# Patient Record
Sex: Female | Born: 1945
Health system: Southern US, Community
[De-identification: ages and names within clinical notes are randomized; demographics above are authoritative.]

## PROBLEM LIST (undated history)

## (undated) DIAGNOSIS — F329 Major depressive disorder, single episode, unspecified: Secondary | ICD-10-CM

## (undated) DIAGNOSIS — I10 Essential (primary) hypertension: Secondary | ICD-10-CM

## (undated) DIAGNOSIS — E785 Hyperlipidemia, unspecified: Secondary | ICD-10-CM

## (undated) DIAGNOSIS — G510 Bell's palsy: Secondary | ICD-10-CM

## (undated) DIAGNOSIS — F419 Anxiety disorder, unspecified: Secondary | ICD-10-CM

## (undated) DIAGNOSIS — K219 Gastro-esophageal reflux disease without esophagitis: Secondary | ICD-10-CM

## (undated) DIAGNOSIS — C801 Malignant (primary) neoplasm, unspecified: Secondary | ICD-10-CM

## (undated) DIAGNOSIS — R002 Palpitations: Secondary | ICD-10-CM

## (undated) DIAGNOSIS — F32A Depression, unspecified: Secondary | ICD-10-CM

## (undated) DIAGNOSIS — I351 Nonrheumatic aortic (valve) insufficiency: Secondary | ICD-10-CM

## (undated) DIAGNOSIS — Z923 Personal history of irradiation: Secondary | ICD-10-CM

## (undated) DIAGNOSIS — C50919 Malignant neoplasm of unspecified site of unspecified female breast: Secondary | ICD-10-CM

## (undated) HISTORY — DX: Hyperlipidemia, unspecified: E78.5

## (undated) HISTORY — DX: Malignant (primary) neoplasm, unspecified: C80.1

## (undated) HISTORY — DX: Bell's palsy: G51.0

## (undated) HISTORY — PX: OTHER SURGICAL HISTORY: SHX169

## (undated) HISTORY — DX: Palpitations: R00.2

## (undated) HISTORY — PX: TRANSTHORACIC ECHOCARDIOGRAM: SHX275

## (undated) HISTORY — DX: Essential (primary) hypertension: I10

## (undated) HISTORY — DX: Nonrheumatic aortic (valve) insufficiency: I35.1

---
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
## 1976-10-22 HISTORY — PX: TUBAL LIGATION: SHX77

## 1999-09-29 ENCOUNTER — Other Ambulatory Visit: Admission: RE | Admit: 1999-09-29 | Discharge: 1999-09-29 | Payer: Self-pay | Admitting: Urology

## 2000-02-08 ENCOUNTER — Encounter: Admission: RE | Admit: 2000-02-08 | Discharge: 2000-02-08 | Payer: Self-pay | Admitting: Cardiology

## 2000-02-08 ENCOUNTER — Encounter: Payer: Self-pay | Admitting: Cardiology

## 2000-06-28 ENCOUNTER — Other Ambulatory Visit: Admission: RE | Admit: 2000-06-28 | Discharge: 2000-06-28 | Payer: Self-pay | Admitting: *Deleted

## 2001-07-17 ENCOUNTER — Other Ambulatory Visit: Admission: RE | Admit: 2001-07-17 | Discharge: 2001-07-17 | Payer: Self-pay | Admitting: *Deleted

## 2002-08-20 ENCOUNTER — Encounter: Admission: RE | Admit: 2002-08-20 | Discharge: 2002-08-20 | Payer: Self-pay | Admitting: *Deleted

## 2002-08-20 ENCOUNTER — Encounter: Payer: Self-pay | Admitting: *Deleted

## 2004-01-18 ENCOUNTER — Encounter: Admission: RE | Admit: 2004-01-18 | Discharge: 2004-01-18 | Payer: Self-pay | Admitting: Cardiology

## 2004-10-19 ENCOUNTER — Other Ambulatory Visit: Admission: RE | Admit: 2004-10-19 | Discharge: 2004-10-19 | Payer: Self-pay | Admitting: Gynecology

## 2004-12-06 ENCOUNTER — Emergency Department (HOSPITAL_COMMUNITY): Admission: EM | Admit: 2004-12-06 | Discharge: 2004-12-06 | Payer: Self-pay | Admitting: Family Medicine

## 2005-04-03 ENCOUNTER — Emergency Department (HOSPITAL_COMMUNITY): Admission: EM | Admit: 2005-04-03 | Discharge: 2005-04-03 | Payer: Self-pay | Admitting: Family Medicine

## 2005-04-17 ENCOUNTER — Encounter: Admission: RE | Admit: 2005-04-17 | Discharge: 2005-04-17 | Payer: Self-pay | Admitting: Gynecology

## 2005-12-06 ENCOUNTER — Other Ambulatory Visit: Admission: RE | Admit: 2005-12-06 | Discharge: 2005-12-06 | Payer: Self-pay | Admitting: Gynecology

## 2006-07-10 ENCOUNTER — Encounter: Admission: RE | Admit: 2006-07-10 | Discharge: 2006-07-10 | Payer: Self-pay | Admitting: Cardiology

## 2007-05-13 ENCOUNTER — Other Ambulatory Visit: Admission: RE | Admit: 2007-05-13 | Discharge: 2007-05-13 | Payer: Self-pay | Admitting: Gynecology

## 2007-10-23 DIAGNOSIS — G510 Bell's palsy: Secondary | ICD-10-CM

## 2007-10-23 HISTORY — DX: Bell's palsy: G51.0

## 2008-04-29 ENCOUNTER — Emergency Department (HOSPITAL_COMMUNITY): Admission: EM | Admit: 2008-04-29 | Discharge: 2008-04-29 | Payer: Self-pay | Admitting: Family Medicine

## 2008-05-09 ENCOUNTER — Emergency Department (HOSPITAL_COMMUNITY): Admission: EM | Admit: 2008-05-09 | Discharge: 2008-05-09 | Payer: Self-pay | Admitting: Emergency Medicine

## 2009-04-02 ENCOUNTER — Emergency Department (HOSPITAL_COMMUNITY): Admission: EM | Admit: 2009-04-02 | Discharge: 2009-04-02 | Payer: Self-pay | Admitting: Family Medicine

## 2009-04-11 ENCOUNTER — Encounter: Admission: RE | Admit: 2009-04-11 | Discharge: 2009-04-11 | Payer: Self-pay | Admitting: Otolaryngology

## 2009-11-15 ENCOUNTER — Encounter: Admission: RE | Admit: 2009-11-15 | Discharge: 2009-11-15 | Payer: Self-pay | Admitting: Obstetrics and Gynecology

## 2009-11-16 ENCOUNTER — Encounter: Admission: RE | Admit: 2009-11-16 | Discharge: 2009-11-16 | Payer: Self-pay | Admitting: Obstetrics and Gynecology

## 2009-11-22 ENCOUNTER — Encounter: Admission: RE | Admit: 2009-11-22 | Discharge: 2009-11-22 | Payer: Self-pay | Admitting: Obstetrics and Gynecology

## 2009-11-22 DIAGNOSIS — C801 Malignant (primary) neoplasm, unspecified: Secondary | ICD-10-CM

## 2009-11-22 HISTORY — DX: Malignant (primary) neoplasm, unspecified: C80.1

## 2009-11-22 HISTORY — PX: BREAST LUMPECTOMY: SHX2

## 2009-12-13 ENCOUNTER — Encounter: Admission: RE | Admit: 2009-12-13 | Discharge: 2009-12-13 | Payer: Self-pay | Admitting: Surgery

## 2009-12-15 ENCOUNTER — Encounter: Admission: RE | Admit: 2009-12-15 | Discharge: 2009-12-15 | Payer: Self-pay | Admitting: Surgery

## 2009-12-15 ENCOUNTER — Ambulatory Visit (HOSPITAL_BASED_OUTPATIENT_CLINIC_OR_DEPARTMENT_OTHER): Admission: RE | Admit: 2009-12-15 | Discharge: 2009-12-15 | Payer: Self-pay | Admitting: Surgery

## 2010-01-02 ENCOUNTER — Ambulatory Visit: Payer: Self-pay | Admitting: Oncology

## 2010-01-10 ENCOUNTER — Ambulatory Visit: Admission: RE | Admit: 2010-01-10 | Discharge: 2010-02-20 | Payer: Self-pay | Admitting: Radiation Oncology

## 2010-01-10 ENCOUNTER — Encounter: Payer: Self-pay | Admitting: Family Medicine

## 2010-01-16 LAB — COMPREHENSIVE METABOLIC PANEL
Alkaline Phosphatase: 65 U/L (ref 39–117)
CO2: 28 mEq/L (ref 19–32)
Creatinine, Ser: 0.75 mg/dL (ref 0.40–1.20)
Glucose, Bld: 96 mg/dL (ref 70–99)
Sodium: 138 mEq/L (ref 135–145)
Total Bilirubin: 0.6 mg/dL (ref 0.3–1.2)

## 2010-01-16 LAB — CBC WITH DIFFERENTIAL/PLATELET
BASO%: 0.6 % (ref 0.0–2.0)
Eosinophils Absolute: 0.2 10*3/uL (ref 0.0–0.5)
HCT: 39.5 % (ref 34.8–46.6)
LYMPH%: 33.5 % (ref 14.0–49.7)
MCHC: 34.2 g/dL (ref 31.5–36.0)
MCV: 93.6 fL (ref 79.5–101.0)
MONO#: 0.6 10*3/uL (ref 0.1–0.9)
MONO%: 10.3 % (ref 0.0–14.0)
NEUT%: 51.7 % (ref 38.4–76.8)
Platelets: 205 10*3/uL (ref 145–400)
RBC: 4.22 10*6/uL (ref 3.70–5.45)
WBC: 5.7 10*3/uL (ref 3.9–10.3)

## 2010-01-27 LAB — HYPERCOAGULABLE PANEL, COMPREHENSIVE
Anticardiolipin IgA: 2 APL U/mL (ref <22)
Anticardiolipin IgM: 9 MPL U/mL (ref <11)
DRVVT: 43.3 secs (ref 36.2–44.3)
Lupus Anticoagulant: NOT DETECTED
Protein C, Total: 109 % (ref 70–140)
Protein S Activity: 104 % (ref 69–129)

## 2010-03-09 ENCOUNTER — Ambulatory Visit: Payer: Self-pay | Admitting: Oncology

## 2010-03-13 LAB — CBC WITH DIFFERENTIAL/PLATELET
BASO%: 0.7 % (ref 0.0–2.0)
Basophils Absolute: 0 10*3/uL (ref 0.0–0.1)
Eosinophils Absolute: 0.2 10*3/uL (ref 0.0–0.5)
HCT: 37.5 % (ref 34.8–46.6)
HGB: 13.2 g/dL (ref 11.6–15.9)
LYMPH%: 27.2 % (ref 14.0–49.7)
MCHC: 35.1 g/dL (ref 31.5–36.0)
MONO#: 0.5 10*3/uL (ref 0.1–0.9)
NEUT%: 55.7 % (ref 38.4–76.8)
Platelets: 169 10*3/uL (ref 145–400)
WBC: 4 10*3/uL (ref 3.9–10.3)
lymph#: 1.1 10*3/uL (ref 0.9–3.3)

## 2010-05-04 ENCOUNTER — Ambulatory Visit: Payer: Self-pay | Admitting: Oncology

## 2010-05-08 LAB — CBC WITH DIFFERENTIAL/PLATELET
BASO%: 0.6 % (ref 0.0–2.0)
Eosinophils Absolute: 0.1 10*3/uL (ref 0.0–0.5)
HCT: 37.2 % (ref 34.8–46.6)
LYMPH%: 29 % (ref 14.0–49.7)
MCHC: 34.7 g/dL (ref 31.5–36.0)
MCV: 92.8 fL (ref 79.5–101.0)
MONO%: 10.1 % (ref 0.0–14.0)
NEUT%: 56.9 % (ref 38.4–76.8)
Platelets: 184 10*3/uL (ref 145–400)
RBC: 4.01 10*6/uL (ref 3.70–5.45)

## 2010-05-09 LAB — COMPREHENSIVE METABOLIC PANEL
Alkaline Phosphatase: 46 U/L (ref 39–117)
Creatinine, Ser: 0.88 mg/dL (ref 0.40–1.20)
Glucose, Bld: 104 mg/dL — ABNORMAL HIGH (ref 70–99)
Sodium: 138 mEq/L (ref 135–145)
Total Bilirubin: 0.4 mg/dL (ref 0.3–1.2)
Total Protein: 6.7 g/dL (ref 6.0–8.3)

## 2010-05-09 LAB — VITAMIN D 25 HYDROXY (VIT D DEFICIENCY, FRACTURES): Vit D, 25-Hydroxy: 38 ng/mL (ref 30–89)

## 2010-05-23 ENCOUNTER — Encounter: Payer: Self-pay | Admitting: Family Medicine

## 2010-05-30 ENCOUNTER — Ambulatory Visit: Payer: Self-pay | Admitting: Cardiology

## 2010-11-09 ENCOUNTER — Ambulatory Visit: Payer: Self-pay | Admitting: Oncology

## 2010-11-09 ENCOUNTER — Encounter
Admission: RE | Admit: 2010-11-09 | Discharge: 2010-11-09 | Payer: Self-pay | Source: Home / Self Care | Attending: Oncology | Admitting: Oncology

## 2010-11-13 LAB — COMPREHENSIVE METABOLIC PANEL
ALT: 16 U/L (ref 0–35)
Alkaline Phosphatase: 40 U/L (ref 39–117)
Sodium: 138 mEq/L (ref 135–145)
Total Bilirubin: 0.4 mg/dL (ref 0.3–1.2)
Total Protein: 6.5 g/dL (ref 6.0–8.3)

## 2010-11-13 LAB — CBC WITH DIFFERENTIAL/PLATELET
BASO%: 1.4 % (ref 0.0–2.0)
EOS%: 2.9 % (ref 0.0–7.0)
LYMPH%: 38.4 % (ref 14.0–49.7)
MCH: 32 pg (ref 25.1–34.0)
MCHC: 34.5 g/dL (ref 31.5–36.0)
MCV: 92.6 fL (ref 79.5–101.0)
MONO%: 9.9 % (ref 0.0–14.0)
Platelets: 168 10*3/uL (ref 145–400)
RBC: 4.02 10*6/uL (ref 3.70–5.45)
WBC: 4.6 10*3/uL (ref 3.9–10.3)

## 2010-11-21 NOTE — Letter (Signed)
Summary: [facility]   Imported By: Curtis Sites 05/25/2010 13:58:23  _____________________________________________________________________  External Attachment:    Type:   Image     Comment:   External Document

## 2010-11-21 NOTE — [facility] Notes (Signed)
[facility]   Imported By: Lind Guest 01/13/2010 08:48:36  _____________________________________________________________________  External Attachment:    Type:   Image     Comment:   External Document

## 2010-12-08 ENCOUNTER — Ambulatory Visit (INDEPENDENT_AMBULATORY_CARE_PROVIDER_SITE_OTHER): Payer: Medicare Other | Admitting: Cardiology

## 2010-12-08 DIAGNOSIS — N3090 Cystitis, unspecified without hematuria: Secondary | ICD-10-CM

## 2010-12-08 DIAGNOSIS — Z79899 Other long term (current) drug therapy: Secondary | ICD-10-CM

## 2010-12-08 DIAGNOSIS — E7800 Pure hypercholesterolemia, unspecified: Secondary | ICD-10-CM

## 2010-12-08 DIAGNOSIS — R002 Palpitations: Secondary | ICD-10-CM

## 2011-01-10 LAB — COMPREHENSIVE METABOLIC PANEL
BUN: 8 mg/dL (ref 6–23)
CO2: 29 mEq/L (ref 19–32)
Calcium: 9.3 mg/dL (ref 8.4–10.5)
Chloride: 104 mEq/L (ref 96–112)
Creatinine, Ser: 0.77 mg/dL (ref 0.4–1.2)
GFR calc non Af Amer: >60 mL/min (ref >60)
Glucose, Bld: 109 mg/dL — ABNORMAL HIGH (ref 70–99)
Total Bilirubin: 0.9 mg/dL (ref 0.3–1.2)

## 2011-01-10 LAB — CBC
HCT: 39.3 % (ref 36.0–46.0)
MCHC: 34.3 g/dL (ref 30.0–36.0)
MCV: 92.6 fL (ref 78.0–100.0)
RBC: 4.25 MIL/uL (ref 3.87–5.11)
WBC: 6.7 10*3/uL (ref 4.0–10.5)

## 2011-01-10 LAB — DIFFERENTIAL
Basophils Absolute: 0 10*3/uL (ref 0.0–0.1)
Eosinophils Relative: 3 % (ref 0–5)
Lymphocytes Relative: 39 % (ref 12–46)
Neutro Abs: 3.3 10*3/uL (ref 1.7–7.7)
Neutrophils Relative %: 49 % (ref 43–77)

## 2011-01-29 LAB — POCT URINALYSIS DIP (DEVICE)
Bilirubin Urine: NEGATIVE
Glucose, UA: NEGATIVE mg/dL
Specific Gravity, Urine: <=1.005 (ref 1.005–1.030)
Urobilinogen, UA: 0.2 mg/dL (ref 0.0–1.0)

## 2011-02-08 ENCOUNTER — Other Ambulatory Visit: Payer: Self-pay | Admitting: *Deleted

## 2011-02-08 DIAGNOSIS — I10 Essential (primary) hypertension: Secondary | ICD-10-CM

## 2011-02-08 MED ORDER — ATENOLOL 25 MG PO TABS: 25.0000 mg | ORAL_TABLET | Freq: Every day | ORAL | Status: DC

## 2011-02-08 NOTE — Telephone Encounter (Signed)
Faxed refill to [facility] for Atenolol

## 2011-02-28 ENCOUNTER — Telehealth: Payer: Self-pay | Admitting: Cardiology

## 2011-02-28 DIAGNOSIS — I10 Essential (primary) hypertension: Secondary | ICD-10-CM

## 2011-02-28 DIAGNOSIS — R002 Palpitations: Secondary | ICD-10-CM

## 2011-02-28 MED ORDER — ATENOLOL 25 MG PO TABS: ORAL_TABLET | ORAL | Status: DC

## 2011-02-28 NOTE — Telephone Encounter (Signed)
Patient called stating  that verbal ok was given at last office visit for her to take an extra atenolol when her heart was racing.  Looked in chart and this advise given in past.  Sent in new rx for increased dose.

## 2011-02-28 NOTE — Telephone Encounter (Signed)
Has a question about her medication. She said that she will be running out.

## 2011-03-05 NOTE — Telephone Encounter (Signed)
Agree with adjustment in dose as necessary

## 2011-03-07 ENCOUNTER — Encounter (INDEPENDENT_AMBULATORY_CARE_PROVIDER_SITE_OTHER): Payer: Self-pay | Admitting: Surgery

## 2011-04-04 ENCOUNTER — Telehealth: Payer: Self-pay | Admitting: Cardiology

## 2011-04-04 DIAGNOSIS — R0989 Other specified symptoms and signs involving the circulatory and respiratory systems: Secondary | ICD-10-CM

## 2011-04-04 MED ORDER — AZITHROMYCIN 250 MG PO TABS: ORAL_TABLET | ORAL | Status: AC

## 2011-04-04 NOTE — Telephone Encounter (Signed)
Call in a Z-Pak and have her take plain Mucinex

## 2011-04-04 NOTE — Telephone Encounter (Signed)
Allergic to EES per paper chart.  Please advise

## 2011-04-04 NOTE — Telephone Encounter (Signed)
Advised and called in to pharmacy

## 2011-04-04 NOTE — Telephone Encounter (Signed)
Patient wanted RN to know that she is having some head and chest congestion and she wants to know if Dr.Brackbill will call in an antibiotic for her

## 2011-04-20 ENCOUNTER — Telehealth: Payer: Self-pay | Admitting: Cardiology

## 2011-04-20 ENCOUNTER — Other Ambulatory Visit (INDEPENDENT_AMBULATORY_CARE_PROVIDER_SITE_OTHER): Payer: Medicare Other | Admitting: *Deleted

## 2011-04-20 DIAGNOSIS — R309 Painful micturition, unspecified: Secondary | ICD-10-CM

## 2011-04-20 NOTE — Telephone Encounter (Signed)
Spoke with patient.  Has pain when urinates and toilet tissue had a little pink on it this am.  Denies any vaginal discharge.  Coming in for urinalysis

## 2011-04-20 NOTE — Telephone Encounter (Signed)
Addended by: Regis Bill B on: 04/20/2011 09:55 AM   Modules accepted: Orders

## 2011-04-20 NOTE — Telephone Encounter (Signed)
PATIENT COMPLAINING OF BURNING WHEN URINATING.  ALSO STATES LIGHT PINK ON TOLIET PAPER.  PATIENT CANNOT TAKE MICROBID/MICRODENTIN(SPELLING), SHE CAN TAKE CIPRO.

## 2011-04-20 NOTE — Telephone Encounter (Signed)
Left message

## 2011-04-20 NOTE — Telephone Encounter (Signed)
If UA is positive can Rx cipro 250 BID x 7 days

## 2011-04-21 LAB — URINALYSIS W MICROSCOPIC + REFLEX CULTURE
Glucose, UA: NEGATIVE mg/dL
Protein, ur: 30 mg/dL — AB
Specific Gravity, Urine: 1.013 (ref 1.005–1.030)
Squamous Epithelial / LPF: NONE SEEN
Urobilinogen, UA: 0.2 mg/dL (ref 0.0–1.0)
WBC, UA: >50 WBC/hpf — AB (ref <3)

## 2011-04-23 ENCOUNTER — Telehealth: Payer: Self-pay | Admitting: *Deleted

## 2011-04-23 LAB — URINE CULTURE

## 2011-04-23 NOTE — Telephone Encounter (Signed)
Message copied by Burnell Blanks on Mon Apr 23, 2011  4:26 PM ------      Message from: Cassell Clement      Created: Sun Apr 22, 2011 12:55 PM       We called her in Cipro before the weekend.

## 2011-04-23 NOTE — Progress Notes (Signed)
Left message on 6/29 and today, have not heard back.  Needs cipro

## 2011-04-24 NOTE — Telephone Encounter (Signed)
Spoke with patient yesterday, feeling better

## 2011-04-26 ENCOUNTER — Telehealth: Payer: Self-pay | Admitting: *Deleted

## 2011-04-26 NOTE — Telephone Encounter (Signed)
Advised patient on Monday of results and she stated she was feeling better

## 2011-04-26 NOTE — Telephone Encounter (Signed)
Message copied by Burnell Blanks on Thu Apr 26, 2011  2:06 PM ------      Message from: Cassell Clement      Created: Mon Apr 23, 2011 10:10 PM       Please report.  The urinalysis and culture are growing Escherichia coli.  Hopefully her symptoms are responding to the Cipro

## 2011-04-26 NOTE — Progress Notes (Signed)
advised

## 2011-05-02 ENCOUNTER — Other Ambulatory Visit: Payer: Self-pay | Admitting: Oncology

## 2011-05-02 DIAGNOSIS — Z9889 Other specified postprocedural states: Secondary | ICD-10-CM

## 2011-05-02 DIAGNOSIS — R921 Mammographic calcification found on diagnostic imaging of breast: Secondary | ICD-10-CM

## 2011-05-04 ENCOUNTER — Other Ambulatory Visit: Payer: Self-pay | Admitting: Oncology

## 2011-05-04 ENCOUNTER — Encounter (HOSPITAL_BASED_OUTPATIENT_CLINIC_OR_DEPARTMENT_OTHER): Payer: Medicare Other | Admitting: Oncology

## 2011-05-04 DIAGNOSIS — D0590 Unspecified type of carcinoma in situ of unspecified breast: Secondary | ICD-10-CM

## 2011-05-04 DIAGNOSIS — G4700 Insomnia, unspecified: Secondary | ICD-10-CM

## 2011-05-04 LAB — CBC WITH DIFFERENTIAL/PLATELET
BASO%: 0.4 % (ref 0.0–2.0)
EOS%: 4.1 % (ref 0.0–7.0)
MCH: 31.5 pg (ref 25.1–34.0)
MCHC: 33.9 g/dL (ref 31.5–36.0)
RBC: 3.97 10*6/uL (ref 3.70–5.45)
RDW: 12.5 % (ref 11.2–14.5)
lymph#: 1.8 10*3/uL (ref 0.9–3.3)

## 2011-05-04 LAB — COMPREHENSIVE METABOLIC PANEL
ALT: 15 U/L (ref 0–35)
AST: 18 U/L (ref 0–37)
Albumin: 4.2 g/dL (ref 3.5–5.2)
Calcium: 9.3 mg/dL (ref 8.4–10.5)
Chloride: 102 mEq/L (ref 96–112)
Potassium: 4.3 mEq/L (ref 3.5–5.3)

## 2011-05-07 ENCOUNTER — Ambulatory Visit
Admission: RE | Admit: 2011-05-07 | Discharge: 2011-05-07 | Disposition: A | Discharge status: Home / Self Care | Payer: Medicare Other | Source: Ambulatory Visit | Attending: Oncology | Admitting: Oncology

## 2011-05-07 DIAGNOSIS — Z9889 Other specified postprocedural states: Secondary | ICD-10-CM

## 2011-05-07 DIAGNOSIS — R921 Mammographic calcification found on diagnostic imaging of breast: Secondary | ICD-10-CM

## 2011-06-27 ENCOUNTER — Encounter: Payer: Self-pay | Admitting: *Deleted

## 2011-06-27 DIAGNOSIS — R002 Palpitations: Secondary | ICD-10-CM | POA: Insufficient documentation

## 2011-07-11 ENCOUNTER — Encounter: Payer: Self-pay | Admitting: Cardiology

## 2011-07-11 ENCOUNTER — Ambulatory Visit (INDEPENDENT_AMBULATORY_CARE_PROVIDER_SITE_OTHER): Payer: Medicare Other | Admitting: Cardiology

## 2011-07-11 VITALS — BP 128/70 | HR 70 | Wt 157.0 lb

## 2011-07-11 DIAGNOSIS — I119 Hypertensive heart disease without heart failure: Secondary | ICD-10-CM

## 2011-07-11 DIAGNOSIS — R002 Palpitations: Secondary | ICD-10-CM

## 2011-07-11 DIAGNOSIS — E7800 Pure hypercholesterolemia, unspecified: Secondary | ICD-10-CM

## 2011-07-11 HISTORY — DX: Hypertensive heart disease without heart failure: I11.9

## 2011-07-11 HISTORY — DX: Pure hypercholesterolemia, unspecified: E78.00

## 2011-07-11 LAB — HEPATIC FUNCTION PANEL
Albumin: 4 g/dL (ref 3.5–5.2)
Total Bilirubin: 0.5 mg/dL (ref 0.3–1.2)

## 2011-07-11 LAB — LIPID PANEL
HDL: 40.1 mg/dL (ref >39.00)
LDL Cholesterol: 68 mg/dL (ref 0–99)
Total CHOL/HDL Ratio: 3
Triglycerides: 118 mg/dL (ref 0.0–149.0)

## 2011-07-11 LAB — BASIC METABOLIC PANEL
CO2: 29 mEq/L (ref 19–32)
Chloride: 104 mEq/L (ref 96–112)
Sodium: 139 mEq/L (ref 135–145)

## 2011-07-11 NOTE — Progress Notes (Signed)
Lisa Cox Date of Birth:  27-Sep-1946 [facility] [address]   [address] [place], [place]  [address] [phone]           Fax   [phone]  HPI: This pleasant 65 year old woman is seen for a scheduled six-month followup office visit and lab work.  She has a past history of essential hypertension hyperlipidemia and palpitations.  She also has a remote history of Bell's palsy.  In February 2011 she underwent a lumpectomy for cancer of the right breast and this was followed by radiation therapy.  The patient has had a history of atypical chest pain and had a normal stress Cardiolite study on 12/05/09.  Current Outpatient Prescriptions  Medication Sig Dispense Refill  . aspirin 81 MG tablet Take 81 mg by mouth daily. Takes occ.       Marland Kitchen atenolol (TENORMIN) 25 MG tablet As needed       . atorvastatin (LIPITOR) 20 MG tablet Take 20 mg by mouth as directed. Taking one or two times a week       . b complex vitamins tablet Take 1 tablet by mouth daily.        Marland Kitchen LORazepam (ATIVAN) 1 MG tablet Take 1 mg by mouth every 8 (eight) hours. Taking one daily      . Omega-3 Fatty Acids (FISH OIL CONCENTRATE PO) Take by mouth.        . Omeprazole (PRILOSEC PO) Take by mouth. As directed       . tamoxifen (NOLVADEX) 10 MG tablet Take 10 mg by mouth 2 (two) times daily. As directed       . VITAMIN D, CHOLECALCIFEROL, PO Take by mouth.          Allergies  Allergen Reactions  . Ambien   . Crestor (Rosuvastatin Calcium)     Leg cramps  . Erythromycin     nausea  . Niacin And Related     itching  . Restoril     Patient Active Problem List  Diagnoses  . Cancer  . Palpitations    History  Smoking status  . Never Smoker   Smokeless tobacco  . Not on file    History  Alcohol Use No    Family History  Problem Relation Age of Onset  . Cancer Father     pancreatic    Review of Systems: The patient denies any heat or cold intolerance.  No weight gain or  weight loss.  The patient denies headaches or blurry vision.  There is no cough or sputum production.  The patient denies dizziness.  There is no hematuria or hematochezia.  The patient denies any muscle aches or arthritis.  The patient denies any rash.  The patient denies frequent falling or instability.  There is no history of depression or anxiety.  All other systems were reviewed and are negative.   Physical Exam: Filed Vitals:   07/11/11 0842  BP: 128/70  Pulse: 70  The general appearance reveals a well-developed well-nourished woman in no distress.The head and neck exam reveals pupils equal and reactive.  Extraocular movements are full.  There is no scleral icterus.  The mouth and pharynx are normal.  The neck is supple.  The carotids reveal no bruits.  The jugular venous pressure is normal.  The  thyroid is not enlarged.  There is no lymphadenopathy.  The chest is clear to percussion and auscultation.  There are no rales or rhonchi.  Expansion of the chest is symmetrical.  The precordium is quiet.  The first heart sound is normal.  The second heart sound is physiologically split.  There is no murmur gallop rub or click.  There is no abnormal lift or heave.  The abdomen is soft and nontender.  The bowel sounds are normal.  The liver and spleen are not enlarged.  There are no abdominal masses.  There are no abdominal bruits.  Extremities reveal good pedal pulses.  There is no phlebitis or edema.  There is no cyanosis or clubbing.  Strength is normal and symmetrical in all extremities.  There is no lateralizing weakness.  There are no sensory deficits.  The skin is warm and dry.  There is no rash.      Assessment / Plan: The patient is to continue same medication.  She will be using urgent care as her primary care physician for the time being.  We will plan to see her for cardiology followup in 6 months for office visit and fasting lab work

## 2011-07-11 NOTE — Assessment & Plan Note (Signed)
Patient has a history of hypercholesterolemia.  She is on Lipitor 20 mg daily.  She's not having any myalgias from Lipitor.  We are checking blood work today.

## 2011-07-11 NOTE — Assessment & Plan Note (Signed)
The patient has a past history of essential hypertension.  She is on a atenolol.  She's not having any headaches or dizzy spells.

## 2011-07-11 NOTE — Assessment & Plan Note (Signed)
Patient has a history of intermittent palpitations.  She's had no recent episodes.  She does try to get regular exercise by walking.  She has not been expressing any chest pain or shortness of breath.  No dizziness or syncope.

## 2011-07-16 ENCOUNTER — Telehealth: Payer: Self-pay | Admitting: *Deleted

## 2011-07-16 NOTE — Telephone Encounter (Signed)
Advised of labs 

## 2011-07-16 NOTE — Telephone Encounter (Signed)
Message copied by Eugenia Pancoast on Mon Jul 16, 2011  1:38 PM ------      Message from: Cassell Clement      Created: Thu Jul 12, 2011  9:30 PM       Please report.  The blood work is very good.  The cholesterol is nice and low at 132 and the triglycerides are 118.  The kidney and liver tests are normal

## 2011-07-20 LAB — DIFFERENTIAL
Basophils Relative: 1
Eosinophils Absolute: 0.1
Eosinophils Relative: 2
Monocytes Absolute: 0.5
Monocytes Relative: 8

## 2011-07-20 LAB — CBC
HCT: 40.2
Hemoglobin: 13.6
MCHC: 33.9
MCV: 92.2
RBC: 4.36

## 2011-07-20 LAB — POCT I-STAT, CHEM 8
Calcium, Ion: 1.15
Glucose, Bld: 106 — ABNORMAL HIGH
HCT: 41
Hemoglobin: 13.9
Potassium: 3.9

## 2011-07-20 LAB — PROTIME-INR
INR: 0.9
Prothrombin Time: 12.7

## 2011-07-20 LAB — POCT CARDIAC MARKERS
CKMB, poc: <1 — ABNORMAL LOW
Myoglobin, poc: 41.4
Troponin i, poc: <0.05

## 2011-07-31 ENCOUNTER — Other Ambulatory Visit: Payer: Self-pay | Admitting: Cardiology

## 2011-07-31 DIAGNOSIS — F419 Anxiety disorder, unspecified: Secondary | ICD-10-CM

## 2011-07-31 NOTE — Telephone Encounter (Signed)
Pt needs refill on Lorazepan 1 mg.  The pharmacy can no longer fax for the controlled medication.

## 2011-08-01 MED ORDER — LORAZEPAM 1 MG PO TABS: ORAL_TABLET | ORAL | Status: DC

## 2011-08-01 NOTE — Telephone Encounter (Signed)
Pt has called regarding the medication being called in for her.

## 2011-08-01 NOTE — Telephone Encounter (Signed)
[facility]

## 2011-08-02 ENCOUNTER — Telehealth: Payer: Self-pay | Admitting: *Deleted

## 2011-08-02 NOTE — Telephone Encounter (Signed)
Opened in error

## 2011-09-28 ENCOUNTER — Other Ambulatory Visit: Payer: Self-pay | Admitting: Gastroenterology

## 2011-09-28 ENCOUNTER — Encounter: Payer: Self-pay | Admitting: Cardiology

## 2011-09-28 ENCOUNTER — Other Ambulatory Visit: Payer: Self-pay

## 2011-09-28 ENCOUNTER — Observation Stay (HOSPITAL_COMMUNITY)
Admission: EM | Admit: 2011-09-28 | Discharge: 2011-09-30 | DRG: 641 | Disposition: A | Discharge status: Home / Self Care | Payer: Medicare Other | Attending: Internal Medicine | Admitting: Internal Medicine

## 2011-09-28 ENCOUNTER — Emergency Department (HOSPITAL_COMMUNITY): Payer: Medicare Other

## 2011-09-28 ENCOUNTER — Encounter (HOSPITAL_COMMUNITY): Payer: Self-pay | Admitting: *Deleted

## 2011-09-28 DIAGNOSIS — Z79899 Other long term (current) drug therapy: Secondary | ICD-10-CM

## 2011-09-28 DIAGNOSIS — J40 Bronchitis, not specified as acute or chronic: Secondary | ICD-10-CM | POA: Diagnosis present

## 2011-09-28 DIAGNOSIS — I119 Hypertensive heart disease without heart failure: Secondary | ICD-10-CM | POA: Insufficient documentation

## 2011-09-28 DIAGNOSIS — C50919 Malignant neoplasm of unspecified site of unspecified female breast: Secondary | ICD-10-CM | POA: Diagnosis present

## 2011-09-28 DIAGNOSIS — D696 Thrombocytopenia, unspecified: Secondary | ICD-10-CM | POA: Diagnosis present

## 2011-09-28 DIAGNOSIS — J209 Acute bronchitis, unspecified: Secondary | ICD-10-CM | POA: Diagnosis present

## 2011-09-28 DIAGNOSIS — F419 Anxiety disorder, unspecified: Secondary | ICD-10-CM

## 2011-09-28 DIAGNOSIS — Z853 Personal history of malignant neoplasm of breast: Secondary | ICD-10-CM

## 2011-09-28 DIAGNOSIS — Z7982 Long term (current) use of aspirin: Secondary | ICD-10-CM

## 2011-09-28 DIAGNOSIS — E860 Dehydration: Principal | ICD-10-CM | POA: Diagnosis present

## 2011-09-28 DIAGNOSIS — R55 Syncope and collapse: Secondary | ICD-10-CM | POA: Diagnosis present

## 2011-09-28 DIAGNOSIS — I959 Hypotension, unspecified: Secondary | ICD-10-CM | POA: Diagnosis present

## 2011-09-28 DIAGNOSIS — R509 Fever, unspecified: Secondary | ICD-10-CM | POA: Diagnosis present

## 2011-09-28 HISTORY — DX: Anxiety disorder, unspecified: F41.9

## 2011-09-28 HISTORY — DX: Gastro-esophageal reflux disease without esophagitis: K21.9

## 2011-09-28 HISTORY — DX: Major depressive disorder, single episode, unspecified: F32.9

## 2011-09-28 HISTORY — DX: Depression, unspecified: F32.A

## 2011-09-28 LAB — COMPREHENSIVE METABOLIC PANEL
ALT: 16 U/L (ref 0–35)
AST: 23 U/L (ref 0–37)
Albumin: 3.8 g/dL (ref 3.5–5.2)
Alkaline Phosphatase: 39 U/L (ref 39–117)
Chloride: 103 mEq/L (ref 96–112)
Creatinine, Ser: 0.74 mg/dL (ref 0.50–1.10)
Potassium: 3.8 mEq/L (ref 3.5–5.1)
Sodium: 136 mEq/L (ref 135–145)
Total Bilirubin: 0.3 mg/dL (ref 0.3–1.2)

## 2011-09-28 LAB — URINE MICROSCOPIC-ADD ON

## 2011-09-28 LAB — DIFFERENTIAL
Basophils Absolute: 0 10*3/uL (ref 0.0–0.1)
Basophils Relative: 0 % (ref 0–1)
Neutro Abs: 7.9 10*3/uL — ABNORMAL HIGH (ref 1.7–7.7)
Neutrophils Relative %: 85 % — ABNORMAL HIGH (ref 43–77)

## 2011-09-28 LAB — URINALYSIS, ROUTINE W REFLEX MICROSCOPIC
Bilirubin Urine: NEGATIVE
Specific Gravity, Urine: 1.007 (ref 1.005–1.030)
Urobilinogen, UA: 0.2 mg/dL (ref 0.0–1.0)

## 2011-09-28 LAB — CBC
MCHC: 34.6 g/dL (ref 30.0–36.0)
RDW: 12.8 % (ref 11.5–15.5)
WBC: 9.3 10*3/uL (ref 4.0–10.5)

## 2011-09-28 LAB — LACTIC ACID, PLASMA: Lactic Acid, Venous: 2.4 mmol/L — ABNORMAL HIGH (ref 0.5–2.2)

## 2011-09-28 MED ORDER — SODIUM CHLORIDE 0.9 % IV SOLN: INTRAVENOUS | Status: DC

## 2011-09-28 MED ORDER — SODIUM CHLORIDE 0.9 % IV BOLUS (SEPSIS)
500.0000 mL | Freq: Once | INTRAVENOUS | Status: AC
  Administered 2011-09-28: 1000 mL via INTRAVENOUS

## 2011-09-28 NOTE — ED Notes (Signed)
[facility] to [facility], pt reports that she was walking and had a syncopal episode. States that she had a colonoscopy this morning and developed a cough this afternoon. Pt A&Ox4 on arrival. bp- 68/40 Hr- 77.  Cbg-101

## 2011-09-28 NOTE — ED Provider Notes (Signed)
History     CSN: [id number] Arrival date & time: 09/28/2011  6:28 PM   Chief Complaint  Patient presents with  . Loss of Consciousness    HPI Pt was seen at 1930.  Per pt and family, c/o sudden onset and resolution of two brief episodes of syncope that occurred PTA.  Pt states one episode occurred while she was sitting at [facility] for a cough, generalized weakness, and persistent diarrhea since completing her GI prep yesterday for her colonoscopy this morning.  Pt states 2nd episode occurred when she went to stand up to go to the bathroom.  EMS noted SBP "60's" on their arrival to scene.  Pt states she "just felt really weak" and "lightheaded" before both syncopal episodes.  No confusion/AMS, no seizure activity, no incont of bowel or bladder, no CP/SOB, no abd pain, no back pain, no N/V, no black or blood in stools.     Past Medical History  Diagnosis Date  . Hypertension   . Bell's palsy 2009  . Hyperlipidemia   . Cancer Feb.2011    breast,lumpectomy right side followed by radiation  . Palpitations     hx of    Past Surgical History  Procedure Date  . Tubal ligation 1978  . Breast lumpectomy Feb. 2011    right breast,followed by radiation therapy    Family History  Problem Relation Age of Onset  . Cancer Father     pancreatic  . Heart disease Father   . Hypertension Sister   . Hypertension Brother     History  Substance Use Topics  . Smoking status: Never Smoker   . Smokeless tobacco: Not on file  . Alcohol Use: No   Review of Systems ROS: Statement: All systems negative except as marked or noted in the HPI; Constitutional: Negative for fever and chills. ; ; Eyes: Negative for eye pain, redness and discharge. ; ; ENMT: Negative for ear pain, hoarseness, nasal congestion, sinus pressure and sore throat. ; ; Cardiovascular: Negative for chest pain, palpitations, diaphoresis, dyspnea and peripheral edema. ; ; Respiratory: +cough. Negative for  wheezing and stridor. ; ; Gastrointestinal: +diarrhea.  Negative for nausea, vomiting, abdominal pain, blood in stool, hematemesis, jaundice and rectal bleeding. . ; ; Genitourinary: Negative for dysuria, flank pain and hematuria. ; ; Musculoskeletal: Negative for back pain and neck pain. Negative for swelling and trauma.; ; Skin: Negative for pruritus, rash, abrasions, blisters, bruising and skin lesion.; ; Neuro: Negative for headache and neck stiffness. Negative for altered mental status, extremity weakness, paresthesias, involuntary movement, seizure and +generalized weakness, lightheadedness, syncope.     Allergies  Ambien; Crestor; Erythromycin; Niacin and related; and Restoril  Home Medications   Current Outpatient Rx  Name Route Sig Dispense Refill  . ASPIRIN 81 MG PO TABS Oral Take 81 mg by mouth once. Does not take on a daily basis    . ATENOLOL 25 MG PO TABS Oral Take 25-50 mg by mouth daily.     . B COMPLEX PO TABS Oral Take 1 tablet by mouth daily.     Marland Kitchen LORAZEPAM 1 MG PO TABS Oral Take 0.25-0.5 mg by mouth 3 (three) times daily as needed. For anxiety and sleep    . FISH OIL CONCENTRATE PO Oral Take 1 capsule by mouth daily.     Marland Kitchen OMEPRAZOLE 40 MG PO CPDR Oral Take 40 mg by mouth every morning.      Marland Kitchen TAMOXIFEN CITRATE 20 MG  PO TABS Oral Take 20 mg by mouth every morning.        BP 149/66  Pulse 106  Temp(Src) 99 F (37.2 C) (Oral)  Resp 17  SpO2 99%  Physical Exam 1935: Physical examination:  Nursing notes reviewed; Vital signs and O2 SAT reviewed;  Constitutional: Well developed, Well nourished, In no acute distress; Head:  Normocephalic, atraumatic; Eyes: EOMI, PERRL, No scleral icterus; ENMT: Mouth and pharynx normal, Mucous membranes dry; Neck: Supple, Full range of motion, No lymphadenopathy; Cardiovascular: Regular rate and rhythm, No murmur, rub, or gallop; Respiratory: Breath sounds clear & equal bilaterally, No rales, rhonchi, wheezes, or rub, Normal respiratory  effort/excursion; Chest: Nontender, Movement normal; Abdomen: Soft, Nontender, Nondistended, Normal bowel sounds; Extremities: Pulses normal, No tenderness, No edema, No calf edema or asymmetry.; Neuro: AA&Ox3, Major CN grossly intact. No facial droop, speech clear.  Moves all ext well without apparent gross focal motor deficits.; Skin: Color normal, Warm, Dry, no rash.    ED Course  Procedures   MDM  MDM Reviewed: nursing note and vitals Reviewed previous: ECG Interpretation: ECG, labs, x-ray and CT scan    Date: 09/28/2011  Rate: 108  Rhythm: sinus tachycardia  QRS Axis: normal  Intervals: normal  ST/T Wave abnormalities: nonspecific T wave changes  Conduction Disutrbances:none  Narrative Interpretation:   Old EKG Reviewed: unchanged; NS T-wave changes improved from previous EKG dated 05/09/2008.  Results for orders placed during the hospital encounter of 09/28/11  CBC      Component Value Range   WBC 9.3  4.0 - 10.5 (K/uL)   RBC 4.25  3.87 - 5.11 (MIL/uL)   Hemoglobin 13.4  12.0 - 15.0 (g/dL)   HCT 16.1  09.6 - 04.5 (%)   MCV 91.1  78.0 - 100.0 (fL)   MCH 31.5  26.0 - 34.0 (pg)   MCHC 34.6  30.0 - 36.0 (g/dL)   RDW 40.9  81.1 - 91.4 (%)   Platelets 145 (*) 150 - 400 (K/uL)  DIFFERENTIAL      Component Value Range   Neutrophils Relative 85 (*) 43 - 77 (%)   Neutro Abs 7.9 (*) 1.7 - 7.7 (K/uL)   Lymphocytes Relative 8 (*) 12 - 46 (%)   Lymphs Abs 0.7  0.7 - 4.0 (K/uL)   Monocytes Relative 7  3 - 12 (%)   Monocytes Absolute 0.7  0.1 - 1.0 (K/uL)   Eosinophils Relative 1  0 - 5 (%)   Eosinophils Absolute 0.1  0.0 - 0.7 (K/uL)   Basophils Relative 0  0 - 1 (%)   Basophils Absolute 0.0  0.0 - 0.1 (K/uL)  COMPREHENSIVE METABOLIC PANEL      Component Value Range   Sodium 136  135 - 145 (mEq/L)   Potassium 3.8  3.5 - 5.1 (mEq/L)   Chloride 103  96 - 112 (mEq/L)   CO2 24  19 - 32 (mEq/L)   Glucose, Bld 102 (*) 70 - 99 (mg/dL)   BUN 6  6 - 23 (mg/dL)   Creatinine, Ser  7.82  0.50 - 1.10 (mg/dL)   Calcium 8.3 (*) 8.4 - 10.5 (mg/dL)   Total Protein 6.7  6.0 - 8.3 (g/dL)   Albumin 3.8  3.5 - 5.2 (g/dL)   AST 23  0 - 37 (U/L)   ALT 16  0 - 35 (U/L)   Alkaline Phosphatase 39  39 - 117 (U/L)   Total Bilirubin 0.3  0.3 - 1.2 (mg/dL)   GFR  calc non Af Amer 86 (*) >90 (mL/min)   GFR calc Af Amer >90  >90 (mL/min)  LIPASE, BLOOD      Component Value Range   Lipase 40  11 - 59 (U/L)  LACTIC ACID, PLASMA      Component Value Range   Lactic Acid, Venous 2.4 (*) 0.5 - 2.2 (mmol/L)  POCT I-STAT TROPONIN I      Component Value Range   Troponin i, poc 0.00  0.00 - 0.08 (ng/mL)   Comment 3           URINALYSIS, ROUTINE W REFLEX MICROSCOPIC      Component Value Range   Color, Urine YELLOW  YELLOW    APPearance CLEAR  CLEAR    Specific Gravity, Urine 1.007  1.005 - 1.030    pH 6.0  5.0 - 8.0    Glucose, UA NEGATIVE  NEGATIVE (mg/dL)   Hgb urine dipstick SMALL (*) NEGATIVE    Bilirubin Urine NEGATIVE  NEGATIVE    Ketones, ur NEGATIVE  NEGATIVE (mg/dL)   Protein, ur NEGATIVE  NEGATIVE (mg/dL)   Urobilinogen, UA 0.2  0.0 - 1.0 (mg/dL)   Nitrite NEGATIVE  NEGATIVE    Leukocytes, UA NEGATIVE  NEGATIVE   URINE MICROSCOPIC-ADD ON      Component Value Range   Squamous Epithelial / LPF RARE  RARE    WBC, UA 0-2  <3 (WBC/hpf)   RBC / HPF 0-2  <3 (RBC/hpf)  CARDIAC PANEL(CRET KIN+CKTOT+MB+TROPI)      Component Value Range   Total CK 70  7 - 177 (U/L)   CK, MB 2.2  0.3 - 4.0 (ng/mL)   Troponin I <0.30  <0.30 (ng/mL)   Relative Index RELATIVE INDEX IS INVALID  0.0 - 2.5    Ct Head Wo Contrast  09/28/2011  *RADIOLOGY REPORT*  Clinical Data: Syncope  CT HEAD WITHOUT CONTRAST  Technique:  Contiguous axial images were obtained from the base of the skull through the vertex without contrast.  Comparison: [facility] MRI brain dated 04/11/2009  Findings: No evidence of parenchymal hemorrhage or extra-axial fluid collection. No mass lesion, mass effect, or midline  shift.  No CT evidence of acute infarction.  Mild intracranial atherosclerosis.  Cerebral volume is age appropriate.  No ventriculomegaly.  Mucosal thickening/mucous retention cyst in the left maxillary sinus.  The visualized paranasal sinuses and mastoid air cells are otherwise clear.  No evidence of calvarial fracture.  IMPRESSION: No evidence of acute intracranial abnormality.  Original Report Authenticated By: Charline Bills, M.D.   Dg Chest Portable 1 View  09/28/2011  *RADIOLOGY REPORT*  Clinical Data: Chest pain.  Hypotension.  Irregular heart rate.  PORTABLE CHEST - 1 VIEW  Comparison: 11  Findings: Shallow inspiration.  Heart size and pulmonary vascularity are normal for technique.  No focal airspace consolidation in the lungs.  No blunting of costophrenic angles. No pneumothorax.  No significant change since the previous study.  IMPRESSION: No evidence of active pulmonary disease.  Original Report Authenticated By: Marlon Pel, M.D.     2250:  No N/V/D while in ED.  No fevers while in ED.  Not orthostatic.  Pt c/o ED Tech and RN that she now feels "weak" and "like I'm going to pass out."  SBP 70's, HR 50's, sinus brady on monitor.  Sats remain 96% R/A.  Mentating well, resps easy, talking with family at bedside.  Neuro exam unchanged.  Denies CP/SOB, no abd pain, no back pain.  Will give IVF bolus and re-eval.   2300:  T/C to [facility] Dr. Adela Glimpse, case discussed, including:  HPI, pertinent PM/SHx, VS/PE, dx testing, ED course and treatment.  Agreeable to admit.  She will be down to ED for eval.  Requests to obtain flu testing and lab set of cardiac markers.  2330:  Pt now admits to her family and ED staff that she took her own ativan approx 1 hour ago (approx 30 min prior to her symptoms of feeling weak with SBP drop) because she "was a little nervous being here."  IVF bolus given with quick return of SBP to 130's.  Pt cautioned not to take her home meds while in the ED without telling ED  staff.  Verb understanding.  Dr. Adela Glimpse made aware of new information.       Richardo Popoff Allison Quarry, DO 09/30/11 [id number]

## 2011-09-28 NOTE — ED Notes (Signed)
Pts son at bedside, pt has R sided breast cancer & went for colonoscopy today for GI bleeding, pt started to feel worse after taking bowel prep yesterday [facility] like symptoms & passed out, EMS was called & her BP was 60/40 in route to hospital, pt on cardiac monitor, current BP 131/52

## 2011-09-29 ENCOUNTER — Encounter (HOSPITAL_COMMUNITY): Payer: Self-pay | Admitting: Internal Medicine

## 2011-09-29 DIAGNOSIS — Z853 Personal history of malignant neoplasm of breast: Secondary | ICD-10-CM

## 2011-09-29 DIAGNOSIS — D696 Thrombocytopenia, unspecified: Secondary | ICD-10-CM | POA: Diagnosis present

## 2011-09-29 DIAGNOSIS — E860 Dehydration: Secondary | ICD-10-CM | POA: Diagnosis present

## 2011-09-29 DIAGNOSIS — R55 Syncope and collapse: Secondary | ICD-10-CM

## 2011-09-29 DIAGNOSIS — J40 Bronchitis, not specified as acute or chronic: Secondary | ICD-10-CM | POA: Diagnosis present

## 2011-09-29 DIAGNOSIS — I959 Hypotension, unspecified: Secondary | ICD-10-CM | POA: Diagnosis present

## 2011-09-29 LAB — INFLUENZA PANEL BY PCR (TYPE A & B)
H1N1 flu by pcr: NOT DETECTED
Influenza A By PCR: NEGATIVE
Influenza B By PCR: NEGATIVE

## 2011-09-29 LAB — CBC
HCT: 35.6 % — ABNORMAL LOW (ref 36.0–46.0)
HCT: 35.2 % — ABNORMAL LOW (ref 36.0–46.0)
Hemoglobin: 12.2 g/dL (ref 12.0–15.0)
MCH: 30.8 pg (ref 26.0–34.0)
MCH: 30.6 pg (ref 26.0–34.0)
MCHC: 34.3 g/dL (ref 30.0–36.0)
MCHC: 34.1 g/dL (ref 30.0–36.0)
RDW: 12.8 % (ref 11.5–15.5)

## 2011-09-29 LAB — COMPREHENSIVE METABOLIC PANEL
ALT: 14 U/L (ref 0–35)
Alkaline Phosphatase: 37 U/L — ABNORMAL LOW (ref 39–117)
CO2: 24 mEq/L (ref 19–32)
Chloride: 101 mEq/L (ref 96–112)
GFR calc Af Amer: >90 mL/min (ref >90)
GFR calc non Af Amer: 85 mL/min — ABNORMAL LOW (ref >90)
Glucose, Bld: 121 mg/dL — ABNORMAL HIGH (ref 70–99)
Potassium: 3.1 mEq/L — ABNORMAL LOW (ref 3.5–5.1)
Sodium: 135 mEq/L (ref 135–145)

## 2011-09-29 LAB — CARDIAC PANEL(CRET KIN+CKTOT+MB+TROPI)
CK, MB: 2 ng/mL (ref 0.3–4.0)
CK, MB: 2.2 ng/mL (ref 0.3–4.0)
Relative Index: INVALID (ref 0.0–2.5)
Total CK: 73 U/L (ref 7–177)
Total CK: 77 U/L (ref 7–177)
Troponin I: <0.3 ng/mL (ref <0.30)
Troponin I: <0.3 ng/mL (ref <0.30)

## 2011-09-29 LAB — CREATININE, SERUM: Creatinine, Ser: 0.78 mg/dL (ref 0.50–1.10)

## 2011-09-29 MED ORDER — HYDROCODONE-ACETAMINOPHEN 5-325 MG PO TABS
1.0000 | ORAL_TABLET | ORAL | Status: DC | PRN
  Filled 2011-09-29: qty 1

## 2011-09-29 MED ORDER — GUAIFENESIN-DM 100-10 MG/5ML PO SYRP: 5.0000 mL | ORAL_SOLUTION | ORAL | Status: DC | PRN

## 2011-09-29 MED ORDER — ACETAMINOPHEN 325 MG PO TABS
650.0000 mg | ORAL_TABLET | Freq: Four times a day (QID) | ORAL | Status: DC | PRN
  Administered 2011-09-29 (×4): 650 mg via ORAL
  Filled 2011-09-29 (×5): qty 2

## 2011-09-29 MED ORDER — METOPROLOL TARTRATE 12.5 MG HALF TABLET
12.5000 mg | ORAL_TABLET | Freq: Four times a day (QID) | ORAL | Status: DC
  Administered 2011-09-29 (×2): 12.5 mg via ORAL
  Filled 2011-09-29 (×5): qty 1

## 2011-09-29 MED ORDER — GUAIFENESIN ER 600 MG PO TB12
600.0000 mg | ORAL_TABLET | Freq: Two times a day (BID) | ORAL | Status: DC
Start: 2011-09-29 — End: 2011-09-30
  Administered 2011-09-29 – 2011-09-30 (×3): 600 mg via ORAL
  Filled 2011-09-29 (×4): qty 1

## 2011-09-29 MED ORDER — ONDANSETRON HCL 4 MG PO TABS: 4.0000 mg | ORAL_TABLET | Freq: Four times a day (QID) | ORAL | Status: DC | PRN

## 2011-09-29 MED ORDER — ONDANSETRON HCL 4 MG/2ML IJ SOLN
4.0000 mg | Freq: Four times a day (QID) | INTRAMUSCULAR | Status: DC | PRN
  Administered 2011-09-29: 4 mg via INTRAVENOUS

## 2011-09-29 MED ORDER — SODIUM CHLORIDE 0.9 % IV SOLN
INTRAVENOUS | Status: DC
  Administered 2011-09-29: 03:00:00 via INTRAVENOUS

## 2011-09-29 MED ORDER — TAMOXIFEN CITRATE 10 MG PO TABS
20.0000 mg | ORAL_TABLET | Freq: Every day | ORAL | Status: DC
  Administered 2011-09-29 – 2011-09-30 (×2): 20 mg via ORAL
  Filled 2011-09-29 (×2): qty 2

## 2011-09-29 MED ORDER — ATENOLOL 25 MG PO TABS
25.0000 mg | ORAL_TABLET | Freq: Every day | ORAL | Status: DC
  Administered 2011-09-30: 25 mg via ORAL
  Filled 2011-09-29: qty 1

## 2011-09-29 MED ORDER — ACETAMINOPHEN 325 MG PO TABS
ORAL_TABLET | ORAL | Status: AC
  Filled 2011-09-29: qty 2

## 2011-09-29 MED ORDER — OSELTAMIVIR PHOSPHATE 75 MG PO CAPS
75.0000 mg | ORAL_CAPSULE | Freq: Two times a day (BID) | ORAL | Status: DC
  Administered 2011-09-29: 75 mg via ORAL
  Filled 2011-09-29 (×2): qty 1

## 2011-09-29 MED ORDER — ALUM & MAG HYDROXIDE-SIMETH 200-200-20 MG/5ML PO SUSP: 30.0000 mL | Freq: Four times a day (QID) | ORAL | Status: DC | PRN

## 2011-09-29 MED ORDER — MAGNESIUM SULFATE BOLUS VIA INFUSION: 2.0000 g | Freq: Once | INTRAVENOUS | Status: DC

## 2011-09-29 MED ORDER — PANTOPRAZOLE SODIUM 40 MG PO TBEC
40.0000 mg | DELAYED_RELEASE_TABLET | Freq: Every day | ORAL | Status: DC
  Administered 2011-09-29 – 2011-09-30 (×2): 40 mg via ORAL
  Filled 2011-09-29: qty 1

## 2011-09-29 MED ORDER — HYDROCODONE-ACETAMINOPHEN 5-325 MG PO TABS
ORAL_TABLET | ORAL | Status: AC
  Filled 2011-09-29: qty 2

## 2011-09-29 MED ORDER — SODIUM CHLORIDE 0.9 % IV BOLUS (SEPSIS)
500.0000 mL | Freq: Once | INTRAVENOUS | Status: AC
  Administered 2011-09-29: 500 mL via INTRAVENOUS

## 2011-09-29 MED ORDER — POTASSIUM CHLORIDE CRYS ER 20 MEQ PO TBCR
40.0000 meq | EXTENDED_RELEASE_TABLET | Freq: Once | ORAL | Status: AC
  Administered 2011-09-29: 40 meq via ORAL
  Filled 2011-09-29: qty 2

## 2011-09-29 MED ORDER — LORAZEPAM 0.5 MG PO TABS: 0.5000 mg | ORAL_TABLET | Freq: Three times a day (TID) | ORAL | Status: DC | PRN

## 2011-09-29 MED ORDER — ENOXAPARIN SODIUM 40 MG/0.4ML ~~LOC~~ SOLN
40.0000 mg | SUBCUTANEOUS | Status: DC
  Administered 2011-09-29 – 2011-09-30 (×2): 40 mg via SUBCUTANEOUS
  Filled 2011-09-29 (×2): qty 0.4

## 2011-09-29 MED ORDER — ATENOLOL 25 MG PO TABS: 25.0000 mg | ORAL_TABLET | Freq: Every day | ORAL | Status: DC

## 2011-09-29 MED ORDER — ALBUTEROL SULFATE (5 MG/ML) 0.5% IN NEBU: 2.5000 mg | INHALATION_SOLUTION | RESPIRATORY_TRACT | Status: DC | PRN

## 2011-09-29 MED ORDER — ACETAMINOPHEN 650 MG RE SUPP: 650.0000 mg | Freq: Four times a day (QID) | RECTAL | Status: DC | PRN

## 2011-09-29 MED ORDER — ASPIRIN 81 MG PO CHEW
81.0000 mg | CHEWABLE_TABLET | Freq: Once | ORAL | Status: AC
  Administered 2011-09-29: 81 mg via ORAL
  Filled 2011-09-29 (×2): qty 1

## 2011-09-29 MED ORDER — LORAZEPAM 1 MG PO TABS
1.0000 mg | ORAL_TABLET | Freq: Four times a day (QID) | ORAL | Status: DC | PRN
  Administered 2011-09-29 (×3): 1 mg via ORAL
  Filled 2011-09-29 (×5): qty 1

## 2011-09-29 MED ORDER — MAGNESIUM SULFATE 40 MG/ML IJ SOLN
2.0000 g | Freq: Once | INTRAMUSCULAR | Status: AC
  Administered 2011-09-29: 2 g via INTRAVENOUS
  Filled 2011-09-29: qty 50

## 2011-09-29 NOTE — Progress Notes (Signed)
*  PRELIMINARY RESULTS* Carotid Dopplers completed.  There is no ICA stenosis.  Vertebral artery flow is antegrade.    Sherren Kerns Renee 09/29/2011, 5:48 PM

## 2011-09-29 NOTE — Progress Notes (Signed)
Subjective: Cough w/ yellow sputum. Diarrhea resolved. No longer dizzy when standing. Feeling much better.   Objective: Blood pressure 136/60, pulse 91, temperature 98.4 F (36.9 C), temperature source Oral, resp. rate 13, height 5\' 5"  (1.651 m), weight 71.6 kg (157 lb 13.6 oz), SpO2 98.00%. Weight change:   Intake/Output Summary (Last 24 hours) at 09/29/11 1409 Last data filed at 09/29/11 1200  Gross per 24 hour  Intake   1000 ml  Output    850 ml  Net    150 ml    Physical Exam: General appearance: alert, cooperative and no distress Eyes: conjunctivae/corneas clear. PERRL, EOM's intact.  Throat: lips, mucosa, and tongue normal; teeth and gums normal Lungs: clear to auscultation bilaterally Heart: regular rate and rhythm, S1, S2 normal, no murmur Abdomen: soft, non-tender; bowel sounds normal; no masses,  no organomegaly Extremities: extremities normal, atraumatic, no cyanosis or edema Skin: Skin color, texture, turgor normal. No rashes or lesions Neurologic: Grossly normal  Lab Results:  Basename 09/29/11 0500 09/29/11 0212 09/28/11 1950  NA 135 -- 136  K 3.1* -- 3.8  CL 101 -- 103  CO2 24 -- 24  GLUCOSE 121* -- 102*  BUN 6 -- 6  CREATININE 0.77 0.78 --  CALCIUM 7.9* -- 8.3*  MG 1.5 -- --  PHOS 2.8 -- --    [facility] 09/29/11 0500 09/28/11 1950  AST 19 23  ALT 14 16  ALKPHOS 37* 39  BILITOT 0.2* 0.3  PROT 6.3 6.7  ALBUMIN 3.3* 3.8    Basename 09/28/11 1950  LIPASE 40  AMYLASE --    Basename 09/29/11 0500 09/29/11 0212 09/28/11 1950  WBC 6.9 7.4 --  NEUTROABS -- -- 7.9*  HGB 12.0 12.2 --  HCT 35.2* 35.6* --  MCV 89.8 89.9 --  PLT 149* 143* --    Basename 09/29/11 1115 09/29/11 0500 09/28/11 2339  CKTOTAL 77 73 70  CKMB 2.0 2.0 2.2  CKMBINDEX -- -- --  TROPONINI <0.30 <0.30 <0.30   No results found for this basename: POCBNP:3 in the last 72 hours No results found for this basename: DDIMER:2 in the last 72 hours No results found for this  basename: HGBA1C:2 in the last 72 hours No results found for this basename: CHOL:2,HDL:2,LDLCALC:2,TRIG:2,CHOLHDL:2,LDLDIRECT:2 in the last 72 hours  Basename 09/29/11 0500  TSH 1.734  T4TOTAL --  T3FREE --  THYROIDAB --   No results found for this basename: VITAMINB12:2,FOLATE:2,FERRITIN:2,TIBC:2,IRON:2,RETICCTPCT:2 in the last 72 hours  Micro Results: Recent Results (from the past 240 hour(s))  MRSA PCR SCREENING     Status: Normal   Collection Time   09/29/11  3:58 AM      Component Value Range Status Comment   MRSA by PCR NEGATIVE  NEGATIVE  Final     Studies/Results: Ct Head Wo Contrast  09/28/2011  *RADIOLOGY REPORT*  Clinical Data: Syncope  CT HEAD WITHOUT CONTRAST  Technique:  Contiguous axial images were obtained from the base of the skull through the vertex without contrast.  Comparison: [facility] MRI brain dated 04/11/2009  Findings: No evidence of parenchymal hemorrhage or extra-axial fluid collection. No mass lesion, mass effect, or midline shift.  No CT evidence of acute infarction.  Mild intracranial atherosclerosis.  Cerebral volume is age appropriate.  No ventriculomegaly.  Mucosal thickening/mucous retention cyst in the left maxillary sinus.  The visualized paranasal sinuses and mastoid air cells are otherwise clear.  No evidence of calvarial fracture.  IMPRESSION: No evidence of acute intracranial abnormality.  Original  Report Authenticated By: Charline Bills, M.D.   Dg Chest Portable 1 View  09/28/2011  *RADIOLOGY REPORT*  Clinical Data: Chest pain.  Hypotension.  Irregular heart rate.  PORTABLE CHEST - 1 VIEW  Comparison: 11  Findings: Shallow inspiration.  Heart size and pulmonary vascularity are normal for technique.  No focal airspace consolidation in the lungs.  No blunting of costophrenic angles. No pneumothorax.  No significant change since the previous study.  IMPRESSION: No evidence of active pulmonary disease.  Original Report Authenticated By: Marlon Pel, M.D.    Medications: Scheduled Meds:   . acetaminophen      . aspirin  81 mg Oral Once  . enoxaparin  40 mg Subcutaneous Q24H  . metoprolol tartrate  12.5 mg Oral Q6H  . pantoprazole  40 mg Oral Q1200  . potassium chloride  40 mEq Oral Once  . sodium chloride  500 mL Intravenous Once  . sodium chloride  500 mL Intravenous Once  . tamoxifen  20 mg Oral Daily  . DISCONTD: oseltamivir  75 mg Oral BID   Continuous Infusions:   . DISCONTD: sodium chloride    . DISCONTD: sodium chloride 125 mL/hr at 09/29/11 0600   PRN Meds:.acetaminophen, acetaminophen, albuterol, alum & mag hydroxide-simeth, guaiFENesin-dextromethorphan, HYDROcodone-acetaminophen, LORazepam, ondansetron (ZOFRAN) IV, ondansetron, DISCONTD: LORazepam  Assessment/Plan: Principal Problem:  *Syncope and collapse  This was most likely from dehydration and hypotension in the setting of diarrhea from a colon prep.  -She has received hydration and BP and symptoms are considerably improved. Orthostatic vital were just checked and are negative. She is eating and drinking well. I will therefore stop the IVF.  -A 2D ECHO and carotid dopplers are ordered and pending.   Dehydration/ Hypotension- resolved. See discussion above.   Bronchitis/ Fever- she has had a congested cough which she noticed yesterday. This is likely viral bronchitis and for now will be treated symptomatically.   Thrombocytopenia- this is acute and is likely related to viral URI   Breast Cancer- Continue Tamoxifen.   HTN- Can resume Atenolol with holding parameters.   Transfer to telemetry. Expected discharge tomorrow.    LOS: 1 day   [facility] [phone] 09/29/2011, 2:09 PM

## 2011-09-29 NOTE — Progress Notes (Signed)
Pt. With order to transfer to 4702. Report given by Thayer Ohm . V/S stable; no c/o made by pt. Transferred .

## 2011-09-29 NOTE — H&P (Signed)
PCP:   Dr. Patty Sermons  Chief Complaint:  Syncope  HPI: Lisa Cox is a 65 y.o. female   has a past medical history of Hypertension; Bell's palsy (2009); Hyperlipidemia; Cancer (Feb.2011); and Palpitations.   Presented with  Intubate x2. Patient has been undergoing colonoscopy preparation for past 2 days she had had copious bowel movements yesterday and today.  Also starting from yesterday she developed a sore throat and mild dry cough and fever up to 101. After she had her colonoscopy  she continued to feel very poorly. She was taken by her son to [facility] hoping to get Tamiflu because he thought maybe she has flu. While waiting in line she have developed light headedness and then collapsed. She never stopped breathing but was unresponsive for a few minutes. When the ambulance finally arrived and they were trying to transfer her to the gurney patient felt like she needed to use the bathroom and wanted to get up. As her family try to set her up she has lost consciousness the second time. She was finally brought to emerge department and was noted to be febrile this is stable vital signs. While in emergency department she has taken home dose of Ativan that she usually takes to help her sleep. Shortly thereafter she appeared to have a vasovagal response but her heart rate going down as well as her blood pressure this was short-lived and she recovered shortly. She states that she has been taking her Ativan on a regular basis but now before this has caused any trouble.  Review of Systems:    Pertinent Positives: Mild cough and sore throat fever at home up to 101. Syncope. Diarrhea secondary to colonoscopy preparation  Pertinent Negatives:No chest pain no shortness of breath, no neurological complaints Otherwise ROS are negative except for above, 10 systems were reviewed  Past Medical History: Past Medical History  Diagnosis Date  . Hypertension   . Bell's palsy 2009  . Hyperlipidemia   .  Cancer Feb.2011    breast,lumpectomy right side followed by radiation  . Palpitations     hx of   Past Surgical History  Procedure Date  . Tubal ligation 1978  . Breast lumpectomy Feb. 2011    right breast,followed by radiation therapy     Medications: Prior to Admission medications   Medication Sig Start Date End Date Taking? Authorizing Provider  aspirin 81 MG tablet Take 81 mg by mouth once. Does not take on a daily basis   Yes Historical Provider, MD  atenolol (TENORMIN) 25 MG tablet Take 25-50 mg by mouth daily.  02/28/11  Yes Cassell Clement, MD  b complex vitamins tablet Take 1 tablet by mouth daily.    Yes Historical Provider, MD  LORazepam (ATIVAN) 1 MG tablet Take 0.25-0.5 mg by mouth 3 (three) times daily as needed. For anxiety and sleep 08/01/11  Yes Cassell Clement, MD  Omega-3 Fatty Acids (FISH OIL CONCENTRATE PO) Take 1 capsule by mouth daily.    Yes Historical Provider, MD  omeprazole (PRILOSEC) 40 MG capsule Take 40 mg by mouth every morning.     Yes Historical Provider, MD  tamoxifen (NOLVADEX) 20 MG tablet Take 20 mg by mouth every morning.     Yes Historical Provider, MD    Allergies:   Allergies  Allergen Reactions  . Ambien     Reaction-"nervous"  . Crestor (Rosuvastatin Calcium)     Leg cramps  . Erythromycin     nausea  . Niacin  And Related Itching  . Restoril     Reaction-"nervous"    Social History:  reports that she has never smoked. She does not have any smokeless tobacco history on file. She reports that she does not drink alcohol or use illicit drugs.   Family History: family history includes Cancer in her father; Heart disease in her father; and Hypertension in her brother and sister.    Physical Exam: Patient Vitals for the past 24 hrs:  BP Temp Temp src Pulse Resp SpO2  09/28/11 2349 149/66 mmHg - - 106  17  99 %  09/28/11 2348 155/54 mmHg - - 100  20  99 %  09/28/11 2334 137/58 mmHg 99 F (37.2 C) Oral 95  16  97 %  09/28/11 2222  141/62 mmHg - - 117  - -  09/28/11 2219 159/68 mmHg - - 112  - -  09/28/11 2216 143/58 mmHg - - 107  - -  09/28/11 2144 140/50 mmHg 99.4 F (37.4 C) Oral - 18  97 %    Alert and Oriented in No Acute distress Mucous Membranes and Skin: Dry mucous membranes normal skin to Head Non traumatic, neck supple Heart: Regular rate and rhythm but somewhat rapid no murmurs appreciated Lungs: Clear to auscultation bilaterally no crackles or wheezes Abdomen: Soft nontender nondistended normal bowel sounds Lower extremities: Without clubbing cyanosis or edema Neurologically Grossly intact strength appears to be intact throughout Skin clean Dry and intact no rash body mass index is unknown because there is no height or weight on file.   Labs on Admission:   [facility] 09/28/11 1950  NA 136  K 3.8  CL 103  CO2 24  GLUCOSE 102*  BUN 6  CREATININE 0.74  CALCIUM 8.3*  MG --  PHOS --    Basename 09/28/11 1950  AST 23  ALT 16  ALKPHOS 39  BILITOT 0.3  PROT 6.7  ALBUMIN 3.8    Basename 09/28/11 1950  LIPASE 40  AMYLASE --    Basename 09/28/11 1950  WBC 9.3  NEUTROABS 7.9*  HGB 13.4  HCT 38.7  MCV 91.1  PLT 145*    Basename 09/28/11 2339  CKTOTAL 70  CKMB 2.2  CKMBINDEX --  TROPONINI <0.30   No results found for this basename: TSH,T4TOTAL,FREET3,T3FREE,THYROIDAB in the last 72 hours No results found for this basename: VITAMINB12:2,FOLATE:2,FERRITIN:2,TIBC:2,IRON:2,RETICCTPCT:2 in the last 72 hours No results found for this basename: HGBA1C    CrCl is unknown because there is no height on file for the current visit. ABG    Component Value Date/Time   TCO2 28 05/09/2008 1846     No results found for this basename: DDIMER     Other results: ED ECG REPORT  Rate: 108  Rhythm: Sinus tachycardia ST&T Change: No ischemic changes  UA no evidence of infection    Blood Culture No results found for this basename: sdes, specrequest, cult, reptstatus         Radiological Exams on Admission: Ct Head Wo Contrast  09/28/2011  *RADIOLOGY REPORT*  Clinical Data: Syncope  CT HEAD WITHOUT CONTRAST  Technique:  Contiguous axial images were obtained from the base of the skull through the vertex without contrast.  Comparison: [facility] MRI brain dated 04/11/2009  Findings: No evidence of parenchymal hemorrhage or extra-axial fluid collection. No mass lesion, mass effect, or midline shift.  No CT evidence of acute infarction.  Mild intracranial atherosclerosis.  Cerebral volume is age appropriate.  No ventriculomegaly.  Mucosal  thickening/mucous retention cyst in the left maxillary sinus.  The visualized paranasal sinuses and mastoid air cells are otherwise clear.  No evidence of calvarial fracture.  IMPRESSION: No evidence of acute intracranial abnormality.  Original Report Authenticated By: Charline Bills, M.D.   Dg Chest Portable 1 View  09/28/2011  *RADIOLOGY REPORT*  Clinical Data: Chest pain.  Hypotension.  Irregular heart rate.  PORTABLE CHEST - 1 VIEW  Comparison: 11  Findings: Shallow inspiration.  Heart size and pulmonary vascularity are normal for technique.  No focal airspace consolidation in the lungs.  No blunting of costophrenic angles. No pneumothorax.  No significant change since the previous study.  IMPRESSION: No evidence of active pulmonary disease.  Original Report Authenticated By: Marlon Pel, M.D.    Assessment/Plan  Present on Admission:  .Syncope and collapse - this is likely secondary to combination of dehydration and possible upper respiratory illness. She clearly had a vasovagal episode while in emergency department. She also appears to be orthostatic on physical exam. No admit administer IV fluids,  given the fact that she became so hypotensive while in emergency department down to systolics of 70s to watch and step down, cycle cardiac markers, obtain echo gram, carotid Dopplers .Cough/fever - also associated with fever  will check for influenza. Until this results are back will continue on Tamiflu will also obtain blood cultures no evidence of pneumonia and chest x-ray  .Dehydration - we'll give IV fluids check orthostatics  .Cancer - will need to continue her tamoxifen as previously scheduled, in a.m. would let oncology no about this to see if they need to confirm use of tamoxifen.   Prophylaxis: Protonix Lovenox  CODE STATUS: Full code   Makyiah Lie 09/29/2011, 1:16 AM

## 2011-09-29 NOTE — ED Notes (Addendum)
Nursing Event Note: orthostatic vital signs obtained 09/28/11 @ 2216. No positional changes in BP/HR noted. VS @ 2230 as follows: BP 127/54, HR 103, RR 16. SpO2 97% on RA. Around 2240,  nursing staff alerted that patient was feeling "weak". patient assessed to have HR of 54 with systolic BP around 117, RR 18, SpO2 95% on RA, A/O x3, color appropriate, NAD.  VS @ 2300: BP 69/34 HR 52 RR 12 SpO2 96% on RA, AAOx3, color appropriate, NAD. Dr. Clarene Duke notified and reported to pt's bedside to evaluate. Shortly after nursing & EDP evaluation, pt's son reported that patient took her home Ativan about 30 minutes prior to her acute onset of weakness and vital changes. Dr. Clarene Duke notified and reported to pt's bedside. Thanked the patient/family for reporting and provided verbal education about the importance of NOT taking personal medication while here in hospital and medications given to son for to take home. Plan of care reviewed and updated with family verbalized understanding.

## 2011-09-30 DIAGNOSIS — I369 Nonrheumatic tricuspid valve disorder, unspecified: Secondary | ICD-10-CM

## 2011-09-30 LAB — URINE CULTURE

## 2011-09-30 LAB — CBC
Hemoglobin: 12 g/dL (ref 12.0–15.0)
MCH: 30.7 pg (ref 26.0–34.0)
MCHC: 34 g/dL (ref 30.0–36.0)
MCV: 90.3 fL (ref 78.0–100.0)
RBC: 3.91 MIL/uL (ref 3.87–5.11)

## 2011-09-30 LAB — BASIC METABOLIC PANEL
CO2: 27 mEq/L (ref 19–32)
Calcium: 8.3 mg/dL — ABNORMAL LOW (ref 8.4–10.5)
Creatinine, Ser: 0.77 mg/dL (ref 0.50–1.10)
Glucose, Bld: 125 mg/dL — ABNORMAL HIGH (ref 70–99)

## 2011-09-30 MED ORDER — POTASSIUM CHLORIDE CRYS ER 20 MEQ PO TBCR
40.0000 meq | EXTENDED_RELEASE_TABLET | Freq: Once | ORAL | Status: AC
  Administered 2011-09-30: 40 meq via ORAL

## 2011-09-30 NOTE — Discharge Summary (Signed)
DISCHARGE SUMMARY  LAKE BREEDING  MR#: [id number]  DOB:[birth year]  Date of Admission: 09/28/2011 Date of Discharge: 09/30/2011  Attending Physician:Riddik Senna  Patient's PCP: Dr Patty Sermons   Presenting Complaint: Syncope  Discharge Diagnoses: Principal Problem:  *Syncope and collapse Active Problems:  Dehydration  Hypotension  Bronchitis, Acute, Viral  Thrombocytopenia, acute  Benign hypertensive heart disease without heart failure  History of breast cancer    Discharge Medications: Current Discharge Medication List    CONTINUE these medications which have NOT CHANGED   Details  aspirin 81 MG tablet Take 81 mg by mouth once. Does not take on a daily basis    atenolol (TENORMIN) 25 MG tablet Take 25-50 mg by mouth daily.     b complex vitamins tablet Take 1 tablet by mouth daily.     LORazepam (ATIVAN) 1 MG tablet Take 0.25-0.5 mg by mouth 3 (three) times daily as needed. For anxiety and sleep    Omega-3 Fatty Acids (FISH OIL CONCENTRATE PO) Take 1 capsule by mouth daily.     omeprazole (PRILOSEC) 40 MG capsule Take 40 mg by mouth every morning.      tamoxifen (NOLVADEX) 20 MG tablet Take 20 mg by mouth every morning.           Procedures: Ct Head Wo Contrast  09/28/2011  *RADIOLOGY REPORT*  Clinical Data: Syncope  CT HEAD WITHOUT CONTRAST  Technique:  Contiguous axial images were obtained from the base of the skull through the vertex without contrast.  Comparison: [facility] MRI brain dated 04/11/2009  Findings: No evidence of parenchymal hemorrhage or extra-axial fluid collection. No mass lesion, mass effect, or midline shift.  No CT evidence of acute infarction.  Mild intracranial atherosclerosis.  Cerebral volume is age appropriate.  No ventriculomegaly.  Mucosal thickening/mucous retention cyst in the left maxillary sinus.  The visualized paranasal sinuses and mastoid air cells are otherwise clear.  No evidence of calvarial fracture.  IMPRESSION: No  evidence of acute intracranial abnormality.  Original Report Authenticated By: Charline Bills, M.D.   Dg Chest Portable 1 View  09/28/2011  *RADIOLOGY REPORT*  Clinical Data: Chest pain.  Hypotension.  Irregular heart rate.  PORTABLE CHEST - 1 VIEW  Comparison: 11  Findings: Shallow inspiration.  Heart size and pulmonary vascularity are normal for technique.  No focal airspace consolidation in the lungs.  No blunting of costophrenic angles. No pneumothorax.  No significant change since the previous study.  IMPRESSION: No evidence of active pulmonary disease.  Original Report Authenticated By: Marlon Pel, M.D.    Echocardiogram Left ventricle: The cavity size was normal. Wall thickness was increased in a pattern of moderate LVH. Systolic function was normal. The estimated ejection fraction was in the range of 55% to 60%. Wall motion was normal; there were no regional wall motion abnormalities. - Left atrium: The atrium was mildly dilated. - Atrial septum: No defect or patent foramen ovale was identified.   Hospital Course: History (per HPI) Patient has been undergoing colonoscopy preparation for past 2 days she had had copious bowel movements yesterday and today.  Also starting from yesterday she developed a sore throat and mild dry cough and fever up to 101. After she had her colonoscopy she continued to feel very poorly. She was taken by her son to [facility] hoping to get Tamiflu because he thought maybe she has flu. While waiting in line she have developed light headedness and then collapsed. She never stopped breathing but was unresponsive for a  few minutes. When the ambulance finally arrived and they were trying to transfer her to the gurney patient felt like she needed to use the bathroom and wanted to get up. As her family try to set her up she has lost consciousness the second time. She was finally brought to emerge department and was noted to be febrile this is stable vital  signs. While in emergency department she has taken home dose of Ativan that she usually takes to help her sleep. Shortly thereafter she appeared to have a vasovagal response but her heart rate going down as well as her blood pressure this was short-lived and she recovered shortly. She states that she has been taking her Ativan on a regular basis but now before this has caused any trouble.  Principal Problem:  *Syncope and collapse - This was most likely from dehydration from the Colonoscopy prep and has not recurred since being adequately hydrated. The ECHO and Carotid dopplers are negative.    Dehydration/ Hypotension- as above   Bronchitis, Acute- Most likely viral. Fevers have resolved. She is to call her PCP if it worsens or does not improve in 1 week.    Thrombocytopenia- Is mild and likely related to above viral illness- should improve.   Benign hypertensive heart disease without heart failure  History of breast cancer    Day of Discharge Physical Exam: BP 124/66  Pulse 79  Temp(Src) 98.9 F (37.2 C) (Oral)  Resp 18  Ht 5\' 5"  (1.651 m)  Wt 72.1 kg (158 lb 15.2 oz)  BMI 26.45 kg/m2  SpO2 98% General appearance: alert, cooperative and no distress  Eyes: conjunctivae/corneas clear. PERRL, EOM's intact.  Throat: lips, mucosa, and tongue normal; teeth and gums normal  Lungs: clear to auscultation bilaterally  Heart: regular rate and rhythm, S1, S2 normal, no murmur  Abdomen: soft, non-tender; bowel sounds normal; no masses, no organomegaly  Extremities: extremities normal, atraumatic, no cyanosis or edema  Skin: Skin color, texture, turgor normal. No rashes or lesions  Neurologic: Grossly normal   Results for orders placed during the hospital encounter of 09/28/11 (from the past 24 hour(s))  CBC     Status: Abnormal   Collection Time   09/30/11 10:22 AM      Component Value Range   WBC 4.6  4.0 - 10.5 (K/uL)   RBC 3.91  3.87 - 5.11 (MIL/uL)   Hemoglobin 12.0  12.0 - 15.0  (g/dL)   HCT 16.1 (*) 09.6 - 46.0 (%)   MCV 90.3  78.0 - 100.0 (fL)   MCH 30.7  26.0 - 34.0 (pg)   MCHC 34.0  30.0 - 36.0 (g/dL)   RDW 04.5  40.9 - 81.1 (%)   Platelets 121 (*) 150 - 400 (K/uL)  BASIC METABOLIC PANEL     Status: Abnormal   Collection Time   09/30/11 10:22 AM      Component Value Range   Sodium 137  135 - 145 (mEq/L)   Potassium 3.4 (*) 3.5 - 5.1 (mEq/L)   Chloride 102  96 - 112 (mEq/L)   CO2 27  19 - 32 (mEq/L)   Glucose, Bld 125 (*) 70 - 99 (mg/dL)   BUN 5 (*) 6 - 23 (mg/dL)   Creatinine, Ser 9.14  0.50 - 1.10 (mg/dL)   Calcium 8.3 (*) 8.4 - 10.5 (mg/dL)   GFR calc non Af Amer 85 (*) >90 (mL/min)   GFR calc Af Amer >90  >90 (mL/min)    Disposition: stable  Follow-up Appts: Discharge Orders    Future Orders Please Complete By Expires   Diet - low sodium heart healthy      Increase activity slowly         Follow-up with Dr.Brackbill in 1 week  Time on Discharge: 60 min  Signed: Tehani Mersman 09/30/2011, 7:24 PM

## 2011-10-05 ENCOUNTER — Encounter: Payer: Self-pay | Admitting: Cardiology

## 2011-10-05 ENCOUNTER — Ambulatory Visit (INDEPENDENT_AMBULATORY_CARE_PROVIDER_SITE_OTHER): Payer: Medicare Other | Admitting: Cardiology

## 2011-10-05 VITALS — BP 128/88 | HR 80 | Ht 65.0 in | Wt 157.0 lb

## 2011-10-05 DIAGNOSIS — I119 Hypertensive heart disease without heart failure: Secondary | ICD-10-CM

## 2011-10-05 DIAGNOSIS — J40 Bronchitis, not specified as acute or chronic: Secondary | ICD-10-CM

## 2011-10-05 DIAGNOSIS — E860 Dehydration: Secondary | ICD-10-CM

## 2011-10-05 DIAGNOSIS — R059 Cough, unspecified: Secondary | ICD-10-CM

## 2011-10-05 DIAGNOSIS — D696 Thrombocytopenia, unspecified: Secondary | ICD-10-CM

## 2011-10-05 DIAGNOSIS — R05 Cough: Secondary | ICD-10-CM

## 2011-10-05 LAB — CULTURE, BLOOD (ROUTINE X 2)
Culture  Setup Time: 201212081817
Culture: NO GROWTH

## 2011-10-05 LAB — BASIC METABOLIC PANEL
BUN: 11 mg/dL (ref 6–23)
GFR: 67.08 mL/min (ref >60.00)
Potassium: 4.2 mEq/L (ref 3.5–5.1)
Sodium: 137 mEq/L (ref 135–145)

## 2011-10-05 LAB — CBC WITH DIFFERENTIAL/PLATELET
Basophils Absolute: 0 10*3/uL (ref 0.0–0.1)
Eosinophils Absolute: 0.1 10*3/uL (ref 0.0–0.7)
MCHC: 33.8 g/dL (ref 30.0–36.0)
MCV: 94.3 fl (ref 78.0–100.0)
Monocytes Absolute: 0.6 10*3/uL (ref 0.1–1.0)
Neutrophils Relative %: 50.9 % (ref 43.0–77.0)
Platelets: 170 10*3/uL (ref 150.0–400.0)
RDW: 13.1 % (ref 11.5–14.6)

## 2011-10-05 MED ORDER — AZITHROMYCIN 250 MG PO TABS: ORAL_TABLET | ORAL | Status: AC

## 2011-10-05 NOTE — Progress Notes (Signed)
Lisa Cox Date of Birth:  1946-04-06 [facility] [address]   [address] [place], [place]  [address] [phone]           Fax   [phone]  HPI: This pleasant 65 year old woman is seen for a post hospital office visit.  She reports that last week she had a colonoscopy.  Following the colonoscopy she was at home and had near syncope and was admitted to come hospital with dehydration and low potassium.  She was given IV fluids.  She was admitted on Friday and discharged Sunday morning.  While in the hospital she had an echocardiogram which showed normal left ventricular systolic function with an ejection fraction of 55-60%.  She's had no further episodes of dizziness.  She has felt weak but yesterday began feeling stronger again.  No chest pain  Current Outpatient Prescriptions  Medication Sig Dispense Refill  . aspirin 81 MG tablet Take 81 mg by mouth once. Does not take on a daily basis      . atenolol (TENORMIN) 25 MG tablet Take 25-50 mg by mouth daily.       Marland Kitchen b complex vitamins tablet Take 1 tablet by mouth daily.       Marland Kitchen LORazepam (ATIVAN) 1 MG tablet Take 0.25-0.5 mg by mouth 3 (three) times daily as needed. For anxiety and sleep      . Omega-3 Fatty Acids (FISH OIL CONCENTRATE PO) Take 1 capsule by mouth daily.       Marland Kitchen omeprazole (PRILOSEC) 40 MG capsule Take 40 mg by mouth every morning.        . tamoxifen (NOLVADEX) 20 MG tablet Take 20 mg by mouth every morning.        Marland Kitchen azithromycin (ZITHROMAX Z-PAK) 250 MG tablet Take 2 tablets (500 mg) on  Day 1,  followed by 1 tablet (250 mg) once daily on Days 2 through 5.  6 each  0    Allergies  Allergen Reactions  . Ambien     Reaction-"nervous"  . Crestor (Rosuvastatin Calcium)     Leg cramps  . Erythromycin     nausea  . Niacin And Related Itching  . Restoril     Reaction-"nervous"    Patient Active Problem List  Diagnoses  . Palpitations  . Hypercholesterolemia  . Benign  hypertensive heart disease without heart failure  . Syncope and collapse  . Dehydration  . Hypotension  . Bronchitis  . History of breast cancer  . Thrombocytopenia    History  Smoking status  . Never Smoker   Smokeless tobacco  . Not on file    History  Alcohol Use No    Family History  Problem Relation Age of Onset  . Cancer Father     pancreatic  . Heart disease Father   . Hypertension Sister   . Hypertension Brother     Review of Systems: The patient denies any heat or cold intolerance.  No weight gain or weight loss.  The patient denies headaches or blurry vision.  There is no cough or sputum production.  The patient denies dizziness.  There is no hematuria or hematochezia.  The patient denies any muscle aches or arthritis.  The patient denies any rash.  The patient denies frequent falling or instability.  There is no history of depression or anxiety.  All other systems were reviewed and are negative.   Physical Exam: Filed Vitals:   10/05/11 1009  BP: 128/88  Pulse: 80   the general appearance reveals a well-developed well-nourished woman in no distress.Pupils equal and reactive.   Extraocular Movements are full.  There is no scleral icterus.  The mouth and pharynx are normal.  The neck is supple.  The carotids reveal no bruits.  The jugular venous pressure is normal.  The thyroid is not enlarged.  There is no lymphadenopathy.  The chest is clear to percussion and auscultation. There are no rales or rhonchi. Expansion of the chest is symmetrical.  The precordium is quiet.  The first heart sound is normal.  The second heart sound is physiologically split.  There is no murmur gallop rub or click.  There is no abnormal lift or heave.  The abdomen is soft and nontender. Bowel sounds are normal. The liver and spleen are not enlarged. There Are no abdominal masses. There are no bruits.  The pedal pulses are good.  There is no phlebitis or edema.  There is no cyanosis or  clubbing. Strength is normal and symmetrical in all extremities.  There is no lateralizing weakness.  There are no sensory deficits.      Assessment / Plan: We are rechecking a CBC and electrolytes today to followup on her thrombocytopenia and her hypokalemia.  She'll continue on other medicines the same and be rechecked at her regular visit in several months.

## 2011-10-05 NOTE — Assessment & Plan Note (Signed)
The patient has a past history of essential hypertension.  She is on atenolol 25 mg once or twice a day.  He has not had any recent palpitations or tachycardia.

## 2011-10-05 NOTE — Patient Instructions (Signed)
KEEP APPOINTMENT FOR FOLLOW UP  Your physician recommends that you return for lab work in: TODAY cbc/ bmet

## 2011-10-05 NOTE — Assessment & Plan Note (Signed)
Patient started to come down with symptoms of bronchitis on the day of her colonoscopy.  She is still coughing up yellowish sputum.  We are calling in a Z-Pak and she will continue on Mucinex

## 2011-10-08 ENCOUNTER — Telehealth: Payer: Self-pay | Admitting: *Deleted

## 2011-10-08 NOTE — Telephone Encounter (Signed)
Message copied by Eugenia Pancoast on Mon Oct 08, 2011  2:50 PM ------      Message from: Cassell Clement      Created: Fri Oct 05, 2011  7:50 PM       Platelets and K are back to normal.

## 2011-11-01 ENCOUNTER — Other Ambulatory Visit: Payer: Self-pay | Admitting: Oncology

## 2011-11-01 DIAGNOSIS — Z853 Personal history of malignant neoplasm of breast: Secondary | ICD-10-CM

## 2011-11-01 DIAGNOSIS — Z9889 Other specified postprocedural states: Secondary | ICD-10-CM

## 2011-11-23 ENCOUNTER — Ambulatory Visit
Admission: RE | Admit: 2011-11-23 | Discharge: 2011-11-23 | Disposition: A | Discharge status: Home / Self Care | Payer: Medicare Other | Source: Ambulatory Visit | Attending: Oncology | Admitting: Oncology

## 2011-11-23 DIAGNOSIS — Z9889 Other specified postprocedural states: Secondary | ICD-10-CM

## 2011-11-23 DIAGNOSIS — Z853 Personal history of malignant neoplasm of breast: Secondary | ICD-10-CM

## 2011-11-30 ENCOUNTER — Ambulatory Visit (INDEPENDENT_AMBULATORY_CARE_PROVIDER_SITE_OTHER): Payer: Medicare Other | Admitting: Surgery

## 2011-11-30 ENCOUNTER — Encounter (INDEPENDENT_AMBULATORY_CARE_PROVIDER_SITE_OTHER): Payer: Self-pay | Admitting: Surgery

## 2011-11-30 VITALS — BP 160/90 | HR 72 | Temp 98.2°F | Resp 12 | Ht 65.0 in | Wt 160.0 lb

## 2011-11-30 DIAGNOSIS — Z853 Personal history of malignant neoplasm of breast: Secondary | ICD-10-CM

## 2011-11-30 NOTE — Progress Notes (Signed)
NAME: [name]       DOB: 08-18-1946           DATE: 11/30/2011       MRN: [id number]   [name] is a 66 y.o.Marland Kitchenfemale who presents for routine followup of her Right breast DCIS diagnosed in 11/2009 and treated with LUMPECTOMY She has no problems or concerns on either side.  PFSH: She has had no significant changes since the last visit here.  ROS: There have been no significant changes since the last visit here  EXAM: General: The patient is alert, oriented, generally healty appearing, NAD. Mood and affect are normal.  Breasts:  No masses bilaterally.    Lymphatics: She has no axillary or supraclavicular adenopathy on either side.  Extremities: Full ROM of the surgical side with no lymphedema noted.  Data Reviewed: Mammogram 11/2011  birads 2  Impression: Doing well, with no evidence of recurrent cancer or new cancer  Plan: Will continue to follow up on an annual basis here.

## 2011-11-30 NOTE — Patient Instructions (Signed)
Follow up 1 year 

## 2012-01-02 ENCOUNTER — Ambulatory Visit (INDEPENDENT_AMBULATORY_CARE_PROVIDER_SITE_OTHER): Payer: Medicare Other | Admitting: Cardiology

## 2012-01-02 ENCOUNTER — Ambulatory Visit: Payer: Medicare Other | Admitting: Oncology

## 2012-01-02 ENCOUNTER — Encounter: Payer: Self-pay | Admitting: Cardiology

## 2012-01-02 ENCOUNTER — Other Ambulatory Visit: Payer: Medicare Other | Admitting: Lab

## 2012-01-02 VITALS — BP 130/82 | HR 76 | Ht 66.0 in | Wt 160.0 lb

## 2012-01-02 DIAGNOSIS — R002 Palpitations: Secondary | ICD-10-CM

## 2012-01-02 DIAGNOSIS — E7800 Pure hypercholesterolemia, unspecified: Secondary | ICD-10-CM

## 2012-01-02 DIAGNOSIS — I119 Hypertensive heart disease without heart failure: Secondary | ICD-10-CM

## 2012-01-02 NOTE — Progress Notes (Signed)
Lisa Cox Date of Birth:  15-Sep-1946 [facility] [address] [place], [place]  [address] [phone]  Fax   [phone]  HPI: This pleasant 66 year old woman is seen for a scheduled followup office visit.  She has a past history of palpitations and a history of hypercholesterolemia.  Since last visit she's been doing well no new cardiac complaints.  She is retired now and she stays busy taking after several grandchildren on a regular basis.  Current Outpatient Prescriptions  Medication Sig Dispense Refill  . aspirin 81 MG tablet Take 81 mg by mouth once. Does not take on a daily basis      . atenolol (TENORMIN) 25 MG tablet Take 25-50 mg by mouth daily.       Marland Kitchen b complex vitamins tablet Take 1 tablet by mouth daily.       Marland Kitchen LORazepam (ATIVAN) 1 MG tablet Take 0.25-0.5 mg by mouth 3 (three) times daily as needed. For anxiety and sleep      . Omega-3 Fatty Acids (FISH OIL CONCENTRATE PO) Take 1 capsule by mouth daily.       Marland Kitchen omeprazole (PRILOSEC) 40 MG capsule Take 40 mg by mouth every morning.        . tamoxifen (NOLVADEX) 20 MG tablet Take 20 mg by mouth every morning.          Allergies  Allergen Reactions  . Ambien     Reaction-"nervous"  . Crestor (Rosuvastatin Calcium)     Leg cramps  . Erythromycin     nausea  . Niacin And Related Itching  . Restoril     Reaction-"nervous"    Patient Active Problem List  Diagnoses  . Palpitations  . Hypercholesterolemia  . Benign hypertensive heart disease without heart failure  . Syncope and collapse  . Dehydration  . Hypotension  . Bronchitis  . History of breast cancer  . Thrombocytopenia  . Cough    History  Smoking status  . Never Smoker   Smokeless tobacco  . Not on file    History  Alcohol Use No    Family History  Problem Relation Age of Onset  . Cancer Father     pancreatic  . Heart disease Father   . Hypertension Sister   . Hypertension Brother     Review of  Systems: The patient denies any heat or cold intolerance.  No weight gain or weight loss.  The patient denies headaches or blurry vision.  There is no cough or sputum production.  The patient denies dizziness.  There is no hematuria or hematochezia.  The patient denies any muscle aches or arthritis.  The patient denies any rash.  The patient denies frequent falling or instability.  There is no history of depression or anxiety.  All other systems were reviewed and are negative.   Physical Exam: Filed Vitals:   01/02/12 1104  BP: 130/82  Pulse: 76   The general appearance reveals a well-developed well-nourished woman in no distress.The head and neck exam reveals pupils equal and reactive.  Extraocular movements are full.  There is no scleral icterus.  The mouth and pharynx are normal.  The neck is supple.  The carotids reveal no bruits.  The jugular venous pressure is normal.  The  thyroid is not enlarged.  There is no lymphadenopathy.  The chest is clear to percussion and auscultation.  There are no rales or rhonchi.  Expansion of the chest is symmetrical.  The precordium  is quiet.  The first heart sound is normal.  The second heart sound is physiologically split.  There is no murmur gallop rub or click.  There is no abnormal lift or heave.  The abdomen is soft and nontender.  The bowel sounds are normal.  The liver and spleen are not enlarged.  There are no abdominal masses.  There are no abdominal bruits.  Extremities reveal good pedal pulses.  There is no phlebitis or edema.  There is no cyanosis or clubbing.  Strength is normal and symmetrical in all extremities.  There is no lateralizing weakness.  There are no sensory deficits.  The skin is warm and dry.  There is no rash.     Assessment / Plan: Continue present medication.  Recheck in 6 months for office visit and fasting lab work.

## 2012-01-02 NOTE — Assessment & Plan Note (Signed)
The patient has not been aware of any recent palpitations or irregular heartbeat.

## 2012-01-02 NOTE — Assessment & Plan Note (Signed)
The patient is not presently on any statin drugs.  She had been trying to watch her weight although her weight is actually up 3 pounds since last visit.  At her next visit plan to obtain fasting lab work

## 2012-01-02 NOTE — Patient Instructions (Signed)
Your physician recommends that you continue on your current medications as directed. Please refer to the Current Medication list given to you today.  Your physician wants you to follow-up in: 6 months. You will receive a reminder letter in the mail two months in advance. If you don't receive a letter, please call our office to schedule the follow-up appointment.  

## 2012-01-02 NOTE — Assessment & Plan Note (Signed)
The patient's blood pressure has been staying stable on current therapy.

## 2012-01-03 ENCOUNTER — Other Ambulatory Visit: Payer: Medicare Other | Admitting: Lab

## 2012-01-10 ENCOUNTER — Other Ambulatory Visit (HOSPITAL_BASED_OUTPATIENT_CLINIC_OR_DEPARTMENT_OTHER): Payer: Medicare Other | Admitting: Lab

## 2012-01-10 ENCOUNTER — Telehealth: Payer: Self-pay | Admitting: Oncology

## 2012-01-10 ENCOUNTER — Encounter: Payer: Self-pay | Admitting: Family

## 2012-01-10 ENCOUNTER — Ambulatory Visit (HOSPITAL_BASED_OUTPATIENT_CLINIC_OR_DEPARTMENT_OTHER): Payer: Medicare Other | Admitting: Family

## 2012-01-10 VITALS — BP 145/72 | HR 78 | Temp 98.7°F | Ht 66.0 in | Wt 161.2 lb

## 2012-01-10 DIAGNOSIS — D0590 Unspecified type of carcinoma in situ of unspecified breast: Secondary | ICD-10-CM

## 2012-01-10 DIAGNOSIS — Z853 Personal history of malignant neoplasm of breast: Secondary | ICD-10-CM

## 2012-01-10 DIAGNOSIS — Z08 Encounter for follow-up examination after completed treatment for malignant neoplasm: Secondary | ICD-10-CM

## 2012-01-10 DIAGNOSIS — G4700 Insomnia, unspecified: Secondary | ICD-10-CM

## 2012-01-10 LAB — CBC WITH DIFFERENTIAL/PLATELET
BASO%: 0.4 % (ref 0.0–2.0)
HCT: 35.5 % (ref 34.8–46.6)
HGB: 12.1 g/dL (ref 11.6–15.9)
MCHC: 34.2 g/dL (ref 31.5–36.0)
MONO#: 0.5 10*3/uL (ref 0.1–0.9)
NEUT%: 52.9 % (ref 38.4–76.8)
WBC: 5.2 10*3/uL (ref 3.9–10.3)
lymph#: 1.8 10*3/uL (ref 0.9–3.3)

## 2012-01-10 LAB — COMPREHENSIVE METABOLIC PANEL
ALT: 13 U/L (ref 0–35)
CO2: 28 mEq/L (ref 19–32)
Calcium: 8.7 mg/dL (ref 8.4–10.5)
Chloride: 103 mEq/L (ref 96–112)
Creatinine, Ser: 0.87 mg/dL (ref 0.50–1.10)
Sodium: 140 mEq/L (ref 135–145)
Total Protein: 6.3 g/dL (ref 6.0–8.3)

## 2012-01-10 NOTE — Telephone Encounter (Signed)
gve the pt her sept 2013 appt calendar °

## 2012-01-10 NOTE — Progress Notes (Signed)
[facility]  Name: Lisa Cox                  DATE: 01/10/2012 MRN: [id number]                      DOB: 07/04/1946  DIAGNOSIS: Patient Active Problem List  Diagnoses Date Noted  . Cough 10/05/2011  . Hypotension 09/29/2011  . Bronchitis 09/29/2011  . History of breast cancer (ductal carcinoma in situ) 09/29/2011  . Thrombocytopenia 09/29/2011  . Syncope and collapse 09/28/2011  . Hypercholesterolemia 07/11/2011  . Benign hypertensive heart disease without heart failure 07/11/2011  . Palpitations      Encounter Diagnosis  Name Primary?  . Follow-up surveillance of ductal carcinoma in situ of breast Yes    PREVIOUS THERAPY:  Right lumpectomy Feb, 2011 followed by radiation therapy.   CURRENT THERAPY:  Tamoxifen 20 mg daily, began May, 2011.   INTERIM HISTORY: Doing very well on Tamoxifen with virtually no complaints related to medication. Initially had several bouts of GI upset, mainly nausea, which has since abated. Takes Prilosec per GI. Has 1-2 mild hot flashes at night, not worse since beginning Tamoxifen. Had colonoscopy in December, recommendation to repeat in 10 years. Has mild vaginal dryness, not severe enough to warrant intervention, according to her. Has upcoming appt with Dr. Marcelle Overlie, gyn.   PHYSICAL EXAM: BP 145/72  Pulse 78  Temp(Src) 98.7 F (37.1 C) (Oral)  Ht 5\' 6"  (1.676 m)  Wt 161 lb 3.2 oz (73.12 kg)  BMI 26.02 kg/m2 General: Well developed, well nourished, in no acute distress.  EENT: No ocular or oral lesions. No stomatitis.  Respiratory: Lungs are clear to auscultation bilaterally with normal respiratory movement and no accessory muscle use. Cardiac: No murmur, rub or tachycardia. No upper or lower extremity edema.  GI: Abdomen is soft, no palpable hepatosplenomegaly. No fluid wave. No tenderness. Musculoskeletal: No kyphosis, no tenderness over the spine, ribs or hips. Lymph: No cervical, infraclavicular, axillary or inguinal  adenopathy. Neuro: No focal neurological deficits. Psych: Alert and oriented X 3, appropriate mood and affect.  BREAST EXAM: In the supine position, with the right arm over the head, the right nipple is everted. No periareolar edema or nipple discharge. No mass in any quadrant or subareolar region. From the 11 o'clock to 2 o'clock position, a curvilinear scar, well healed. No redness of the skin. No right axillary adenopathy. The right breast is smaller in volume than the left owing to lumpectomy and radiation therapy. With the left arm over the head, the left nipple is everted. No periareolar edema or nipple discharge. No mass in any quadrant or subareolar region. No redness of the skin. No left axillary adenopathy.    LABORATORY STUDIES:   Results for orders placed in visit on 01/10/12  CBC WITH DIFFERENTIAL      Component Value Range   WBC 5.2  3.9 - 10.3 (10e3/uL)   NEUT# 2.7  1.5 - 6.5 (10e3/uL)   HGB 12.1  11.6 - 15.9 (g/dL)   HCT 40.9  81.1 - 91.4 (%)   Platelets 191  145 - 400 (10e3/uL)   MCV 92.1  79.5 - 101.0 (fL)   MCH 31.5  25.1 - 34.0 (pg)   MCHC 34.2  31.5 - 36.0 (g/dL)   RBC 7.82  9.56 - 2.13 (10e6/uL)   RDW 13.1  11.2 - 14.5 (%)   lymph# 1.8  0.9 - 3.3 (10e3/uL)   MONO#  0.5  0.1 - 0.9 (10e3/uL)   Eosinophils Absolute 0.1  0.0 - 0.5 (10e3/uL)   Basophils Absolute 0.0  0.0 - 0.1 (10e3/uL)   NEUT% 52.9  38.4 - 76.8 (%)   LYMPH% 35.5  14.0 - 49.7 (%)   MONO% 8.8  0.0 - 14.0 (%)   EOS% 2.4  0.0 - 7.0 (%)   BASO% 0.4  0.0 - 2.0 (%)    IMPRESSION:  66 year old female with: 1. History DCIS Feb. 2011, clinically no evidence of recurrence.  2. Last mammo 11/26/11, no evidence of recurrence.  3. On Tamoxifen since May 2011 with good tolerance.   PLAN:   1. Return to clinic in 6 months following NCCN guidelines.  2. Since last 3 CBC's have been completely normal, and she did not receive chemo, we will dispense with routine labs and defer to primary care.

## 2012-01-10 NOTE — Patient Instructions (Signed)
Remain on Tamoxifen until 2016.

## 2012-01-16 ENCOUNTER — Ambulatory Visit: Payer: Medicare Other | Admitting: Cardiology

## 2012-03-05 ENCOUNTER — Other Ambulatory Visit: Payer: Self-pay | Admitting: *Deleted

## 2012-03-05 DIAGNOSIS — F419 Anxiety disorder, unspecified: Secondary | ICD-10-CM

## 2012-03-06 MED ORDER — LORAZEPAM 1 MG PO TABS: 0.2500 mg | ORAL_TABLET | Freq: Three times a day (TID) | ORAL | Status: DC | PRN

## 2012-07-18 ENCOUNTER — Ambulatory Visit (HOSPITAL_BASED_OUTPATIENT_CLINIC_OR_DEPARTMENT_OTHER): Payer: Medicare Other | Admitting: Oncology

## 2012-07-18 ENCOUNTER — Telehealth: Payer: Self-pay | Admitting: Oncology

## 2012-07-18 ENCOUNTER — Encounter: Payer: Self-pay | Admitting: Oncology

## 2012-07-18 ENCOUNTER — Ambulatory Visit (HOSPITAL_BASED_OUTPATIENT_CLINIC_OR_DEPARTMENT_OTHER): Payer: Medicare Other

## 2012-07-18 VITALS — BP 164/67 | HR 73 | Temp 99.1°F | Resp 20 | Ht 66.0 in | Wt 157.6 lb

## 2012-07-18 DIAGNOSIS — D0590 Unspecified type of carcinoma in situ of unspecified breast: Secondary | ICD-10-CM

## 2012-07-18 DIAGNOSIS — Z853 Personal history of malignant neoplasm of breast: Secondary | ICD-10-CM

## 2012-07-18 LAB — COMPREHENSIVE METABOLIC PANEL (CC13)
ALT: 13 U/L (ref 0–55)
CO2: 26 mEq/L (ref 22–29)
Calcium: 9.4 mg/dL (ref 8.4–10.4)
Chloride: 101 mEq/L (ref 98–107)
Sodium: 136 mEq/L (ref 136–145)
Total Protein: 6.4 g/dL (ref 6.4–8.3)

## 2012-07-18 LAB — CBC WITH DIFFERENTIAL/PLATELET
BASO%: 0.5 % (ref 0.0–2.0)
MCHC: 34.7 g/dL (ref 31.5–36.0)
MONO#: 0.5 10*3/uL (ref 0.1–0.9)
RBC: 4.06 10*6/uL (ref 3.70–5.45)
RDW: 12.9 % (ref 11.2–14.5)
WBC: 6.9 10*3/uL (ref 3.9–10.3)
lymph#: 2.4 10*3/uL (ref 0.9–3.3)

## 2012-07-18 MED ORDER — AZITHROMYCIN 250 MG PO TABS: ORAL_TABLET | ORAL | Status: DC

## 2012-07-18 NOTE — Progress Notes (Signed)
[facility]  Name: Lisa Cox                  DATE: 07/18/2012 MRN: [id number]                      DOB: Mar 23, 1946  DIAGNOSIS: Patient Active Problem List  Diagnoses Date Noted  . Cough 10/05/2011  . Hypotension 09/29/2011  . Bronchitis 09/29/2011  . History of breast cancer (ductal carcinoma in situ) 09/29/2011  . Thrombocytopenia 09/29/2011  . Syncope and collapse 09/28/2011  . Hypercholesterolemia 07/11/2011  . Benign hypertensive heart disease without heart failure 07/11/2011  . Palpitations      Encounter Diagnosis  Name Primary?  . History of breast cancer Yes    PREVIOUS THERAPY:  Right lumpectomy Feb, 2011 followed by radiation therapy.   CURRENT THERAPY:  Tamoxifen 20 mg daily, began May, 2011.   INTERIM HISTORY: 66 year old female who returns today for followup visit. Overall she's doing well she is tolerating tamoxifen without any significant complaints. She denies any nausea vomiting fevers chills night sweats headaches no shortness of breath chest pains or palpitations no myalgias and arthralgias no vaginal bleeding or discharge no visual disturbances. Remainder of the 10 point review of systems is negative.  PHYSICAL EXAM: BP 164/67  Pulse 73  Temp 99.1 F (37.3 C) (Oral)  Resp 20  Ht 5\' 6"  (1.676 m)  Wt 157 lb 9.6 oz (71.487 kg)  BMI 25.44 kg/m2 General: Well developed, well nourished, in no acute distress.  EENT: No ocular or oral lesions. No stomatitis.  Respiratory: Lungs are clear to auscultation bilaterally with normal respiratory movement and no accessory muscle use. Cardiac: No murmur, rub or tachycardia. No upper or lower extremity edema.  GI: Abdomen is soft, no palpable hepatosplenomegaly. No fluid wave. No tenderness. Musculoskeletal: No kyphosis, no tenderness over the spine, ribs or hips. Lymph: No cervical, infraclavicular, axillary or inguinal adenopathy. Neuro: No focal neurological deficits. Psych: Alert and oriented X  3, appropriate mood and affect.  BREAST EXAM: In the supine position, with the right arm over the head, the right nipple is everted. No periareolar edema or nipple discharge. No mass in any quadrant or subareolar region. From the 11 o'clock to 2 o'clock position, a curvilinear scar, well healed. No redness of the skin. No right axillary adenopathy. The right breast is smaller in volume than the left owing to lumpectomy and radiation therapy. With the left arm over the head, the left nipple is everted. No periareolar edema or nipple discharge. No mass in any quadrant or subareolar region. No redness of the skin. No left axillary adenopathy.    LABORATORY STUDIES:   Results for orders placed in visit on 01/10/12  CBC WITH DIFFERENTIAL      Component Value Range   WBC 5.2  3.9 - 10.3 10e3/uL   NEUT# 2.7  1.5 - 6.5 10e3/uL   HGB 12.1  11.6 - 15.9 g/dL   HCT 53.6  64.4 - 03.4 %   Platelets 191  145 - 400 10e3/uL   MCV 92.1  79.5 - 101.0 fL   MCH 31.5  25.1 - 34.0 pg   MCHC 34.2  31.5 - 36.0 g/dL   RBC 7.42  5.95 - 6.38 10e6/uL   RDW 13.1  11.2 - 14.5 %   lymph# 1.8  0.9 - 3.3 10e3/uL   MONO# 0.5  0.1 - 0.9 10e3/uL   Eosinophils Absolute 0.1  0.0 -  0.5 10e3/uL   Basophils Absolute 0.0  0.0 - 0.1 10e3/uL   NEUT% 52.9  38.4 - 76.8 %   LYMPH% 35.5  14.0 - 49.7 %   MONO% 8.8  0.0 - 14.0 %   EOS% 2.4  0.0 - 7.0 %   BASO% 0.4  0.0 - 2.0 %  COMPREHENSIVE METABOLIC PANEL      Component Value Range   Sodium 140  135 - 145 mEq/L   Potassium 4.2  3.5 - 5.3 mEq/L   Chloride 103  96 - 112 mEq/L   CO2 28  19 - 32 mEq/L   Glucose, Bld 90  70 - 99 mg/dL   BUN 17  6 - 23 mg/dL   Creatinine, Ser 1.61  0.50 - 1.10 mg/dL   Total Bilirubin 0.5  0.3 - 1.2 mg/dL   Alkaline Phosphatase 40  39 - 117 U/L   AST 18  0 - 37 U/L   ALT 13  0 - 35 U/L   Total Protein 6.3  6.0 - 8.3 g/dL   Albumin 3.9  3.5 - 5.2 g/dL   Calcium 8.7  8.4 - 09.6 mg/dL    IMPRESSION:  66 year old female with: 1. History DCIS Feb.  2011, clinically no evidence of recurrence.  2. Last mammo 11/26/11, no evidence of recurrence.  3. On Tamoxifen since May 2011 with good tolerance.   PLAN:   #1 patient will continue to be followed every 6 months with [place].  #2 she will have a CBC and CMET on her next visit.   patient knows to call me with any problems questions or concerns. I spent 25 minutes face to face time 50% of which was counseled     Drue Second, MD Medical/Oncology [facility] [phone] (beeper) [phone] (Office)

## 2012-07-18 NOTE — Patient Instructions (Addendum)
You are doing well, continue tamoxifen everyday at the same time.   If you have any problems please call you   I have prescribed azithromycin for your laryngitis.  I will see you back in 6 months

## 2012-07-18 NOTE — Telephone Encounter (Signed)
gve the pt her march 2014 appt calendar °

## 2012-08-22 ENCOUNTER — Other Ambulatory Visit: Payer: Self-pay | Admitting: *Deleted

## 2012-09-05 ENCOUNTER — Encounter: Payer: Self-pay | Admitting: Cardiology

## 2012-09-05 ENCOUNTER — Other Ambulatory Visit (INDEPENDENT_AMBULATORY_CARE_PROVIDER_SITE_OTHER): Payer: Medicare Other

## 2012-09-05 ENCOUNTER — Ambulatory Visit (INDEPENDENT_AMBULATORY_CARE_PROVIDER_SITE_OTHER): Payer: Medicare Other | Admitting: Cardiology

## 2012-09-05 VITALS — BP 130/64 | HR 68 | Resp 18 | Ht 66.0 in | Wt 160.0 lb

## 2012-09-05 DIAGNOSIS — I119 Hypertensive heart disease without heart failure: Secondary | ICD-10-CM

## 2012-09-05 DIAGNOSIS — E7800 Pure hypercholesterolemia, unspecified: Secondary | ICD-10-CM

## 2012-09-05 DIAGNOSIS — R002 Palpitations: Secondary | ICD-10-CM

## 2012-09-05 DIAGNOSIS — R0989 Other specified symptoms and signs involving the circulatory and respiratory systems: Secondary | ICD-10-CM

## 2012-09-05 LAB — HEPATIC FUNCTION PANEL
Albumin: 4 g/dL (ref 3.5–5.2)
Alkaline Phosphatase: 38 U/L — ABNORMAL LOW (ref 39–117)
Bilirubin, Direct: 0.1 mg/dL (ref 0.0–0.3)

## 2012-09-05 LAB — LIPID PANEL
HDL: 44 mg/dL (ref >39.00)
Triglycerides: 156 mg/dL — ABNORMAL HIGH (ref 0.0–149.0)
VLDL: 31.2 mg/dL (ref 0.0–40.0)

## 2012-09-05 LAB — BASIC METABOLIC PANEL
Calcium: 8.8 mg/dL (ref 8.4–10.5)
Creatinine, Ser: 0.9 mg/dL (ref 0.4–1.2)
GFR: 68.67 mL/min (ref >60.00)
Glucose, Bld: 86 mg/dL (ref 70–99)
Sodium: 140 mEq/L (ref 135–145)

## 2012-09-05 NOTE — Assessment & Plan Note (Signed)
The patient has not been experiencing any chest pain or shortness of breath.  No dizziness or syncope.  No peripheral edema.

## 2012-09-05 NOTE — Patient Instructions (Addendum)
Your physician recommends that you continue on your current medications as directed. Please refer to the Current Medication list given to you today.  Your physician wants you to follow-up in: 6 months with fasting labs (lp/bmet/hfp)  You will receive a reminder letter in the mail two months in advance. If you don't receive a letter, please call our office to schedule the follow-up appointment.   Will obtain labs today and call you with the results (lp,bmet,hfp)

## 2012-09-05 NOTE — Assessment & Plan Note (Signed)
The patient has a past history of hypercholesterolemia and is not on statins.  She was intolerant to Crestor.  She is controlling her cholesterol at this point with diet and exercise.

## 2012-09-05 NOTE — Progress Notes (Signed)
Quick Note:  Please report to patient. The recent labs are stable. Continue same medication and careful diet. Blood sugar and triglycerides are higher. Watch carbohydrates and try to lose more weight. ______

## 2012-09-05 NOTE — Progress Notes (Signed)
Lisa Cox Date of Birth:  [birth year] [name] [address] [place], [place]  [address] [phone]  Fax   [phone]  HPI: This pleasant 66 year old woman is seen for a scheduled followup office visit. She has a past history of palpitations and a history of hypercholesterolemia. Since last visit she's been doing well no new cardiac complaints. She is retired now and she stays busy taking after several grandchildren on a regular basis.  She has a past history of breast cancer discovered 2 years ago.  She is on tamoxifen which she would be on for another 3 years.      Current Outpatient Prescriptions  Medication Sig Dispense Refill  . aspirin 81 MG tablet Take 81 mg by mouth once. Does not take on a daily basis      . atenolol (TENORMIN) 25 MG tablet Take 25-50 mg by mouth daily.       Marland Kitchen b complex vitamins tablet Take 1 tablet by mouth daily.       Marland Kitchen LORazepam (ATIVAN) 1 MG tablet Take 0.5 tablets (0.5 mg total) by mouth 3 (three) times daily as needed. For anxiety and sleep  60 tablet  3  . Omega-3 Fatty Acids (FISH OIL CONCENTRATE PO) Take 1 capsule by mouth daily.       Marland Kitchen omeprazole (PRILOSEC) 40 MG capsule Take 40 mg by mouth every morning.        . tamoxifen (NOLVADEX) 20 MG tablet Take 20 mg by mouth every morning.          Allergies  Allergen Reactions  . Crestor (Rosuvastatin Calcium)     Leg cramps  . Erythromycin     nausea  . Niacin And Related Itching  . Restoril     Reaction-"nervous"  . Zolpidem Tartrate     Reaction-"nervous"    Patient Active Problem List  Diagnosis  . Palpitations  . Hypercholesterolemia  . Benign hypertensive heart disease without heart failure  . History of breast cancer    History  Smoking status  . Never Smoker   Smokeless tobacco  . Not on file    History  Alcohol Use No    Family History  Problem Relation Age of Onset  . Cancer Father     pancreatic  . Heart disease Father   .  Hypertension Sister   . Hypertension Brother     Review of Systems: The patient denies any heat or cold intolerance.  No weight gain or weight loss.  The patient denies headaches or blurry vision.  There is no cough or sputum production.  The patient denies dizziness.  There is no hematuria or hematochezia.  The patient denies any muscle aches or arthritis.  The patient denies any rash.  The patient denies frequent falling or instability.  There is no history of depression or anxiety.  All other systems were reviewed and are negative.   Physical Exam: Filed Vitals:   09/05/12 0942  BP: 130/64  Pulse: 68  Resp: 18      Assessment / Plan:       Current Outpatient Prescriptions  Medication Sig Dispense Refill  . aspirin 81 MG tablet Take 81 mg by mouth once. Does not take on a daily basis      . atenolol (TENORMIN) 25 MG tablet Take 25-50 mg by mouth daily.       Marland Kitchen b complex vitamins tablet Take 1 tablet by mouth daily.       Marland Kitchen  LORazepam (ATIVAN) 1 MG tablet Take 0.5 tablets (0.5 mg total) by mouth 3 (three) times daily as needed. For anxiety and sleep  60 tablet  3  . Omega-3 Fatty Acids (FISH OIL CONCENTRATE PO) Take 1 capsule by mouth daily.       Marland Kitchen omeprazole (PRILOSEC) 40 MG capsule Take 40 mg by mouth every morning.        . tamoxifen (NOLVADEX) 20 MG tablet Take 20 mg by mouth every morning.          Allergies  Allergen Reactions  . Crestor (Rosuvastatin Calcium)     Leg cramps  . Erythromycin     nausea  . Niacin And Related Itching  . Restoril     Reaction-"nervous"  . Zolpidem Tartrate     Reaction-"nervous"    Patient Active Problem List  Diagnosis  . Palpitations  . Hypercholesterolemia  . Benign hypertensive heart disease without heart failure  . History of breast cancer    History  Smoking status  . Never Smoker   Smokeless tobacco  . Not on file    History  Alcohol Use No    Family History  Problem Relation Age of Onset  . Cancer  Father     pancreatic  . Heart disease Father   . Hypertension Sister   . Hypertension Brother     Review of Systems: The patient denies any heat or cold intolerance.  No weight gain or weight loss.  The patient denies headaches or blurry vision.  There is no cough or sputum production.  The patient denies dizziness.  There is no hematuria or hematochezia.  The patient denies any muscle aches or arthritis.  The patient denies any rash.  The patient denies frequent falling or instability.  There is no history of depression or anxiety.  All other systems were reviewed and are negative.   Physical Exam: Filed Vitals:   09/05/12 0942  BP: 130/64  Pulse: 68  Resp: 18   general appearance reveals a well-developed well-nourished woman in no distress.The head and neck exam reveals pupils equal and reactive.  Extraocular movements are full.  There is no scleral icterus.  The mouth and pharynx are normal.  The neck is supple.  The carotids reveal no bruits.  The jugular venous pressure is normal.  The  thyroid is not enlarged.  There is no lymphadenopathy.  The chest is clear to percussion and auscultation.  There are no rales or rhonchi.  Expansion of the chest is symmetrical.  The precordium is quiet.  The first heart sound is normal.  The second heart sound is physiologically split.  There is no murmur gallop rub or click.  There is no abnormal lift or heave.  The abdomen is soft and nontender.  The bowel sounds are normal.  The liver and spleen are not enlarged.  There are no abdominal masses.  There are no abdominal bruits.  Extremities reveal good pedal pulses.  There is no phlebitis or edema.  There is no cyanosis or clubbing.  Strength is normal and symmetrical in all extremities.  There is no lateralizing weakness.  There are no sensory deficits.  The skin is warm and dry.  There is no rash.      Assessment / Plan: Await results of today's lab work.  Continue same medication for now.  Recheck  in 6 months for office visit EKG lipid panel hepatic function panel and basal metabolic panel.

## 2012-09-05 NOTE — Assessment & Plan Note (Signed)
Patient has not had any recent palpitations or tachycardia

## 2012-09-09 ENCOUNTER — Telehealth: Payer: Self-pay | Admitting: *Deleted

## 2012-09-09 NOTE — Telephone Encounter (Signed)
Message copied by Burnell Blanks on Tue Sep 09, 2012  2:57 PM ------      Message from: Cassell Clement      Created: Fri Sep 05, 2012  3:36 PM       Please report to patient.  The recent labs are stable. Continue same medication and careful diet.  Blood sugar and triglycerides are higher.  Watch carbohydrates and try to lose more weight.

## 2012-09-09 NOTE — Telephone Encounter (Signed)
Advised patient of lab results  

## 2012-10-12 ENCOUNTER — Emergency Department (HOSPITAL_COMMUNITY)
Admission: EM | Admit: 2012-10-12 | Discharge: 2012-10-12 | Disposition: A | Discharge status: Home / Self Care | Payer: Medicare Other | Attending: Emergency Medicine | Admitting: Emergency Medicine

## 2012-10-12 ENCOUNTER — Encounter (HOSPITAL_COMMUNITY): Payer: Self-pay | Admitting: Emergency Medicine

## 2012-10-12 DIAGNOSIS — K219 Gastro-esophageal reflux disease without esophagitis: Secondary | ICD-10-CM | POA: Insufficient documentation

## 2012-10-12 DIAGNOSIS — Z853 Personal history of malignant neoplasm of breast: Secondary | ICD-10-CM | POA: Insufficient documentation

## 2012-10-12 DIAGNOSIS — K644 Residual hemorrhoidal skin tags: Secondary | ICD-10-CM | POA: Insufficient documentation

## 2012-10-12 DIAGNOSIS — F329 Major depressive disorder, single episode, unspecified: Secondary | ICD-10-CM | POA: Insufficient documentation

## 2012-10-12 DIAGNOSIS — Z923 Personal history of irradiation: Secondary | ICD-10-CM | POA: Insufficient documentation

## 2012-10-12 DIAGNOSIS — R55 Syncope and collapse: Secondary | ICD-10-CM | POA: Insufficient documentation

## 2012-10-12 DIAGNOSIS — F3289 Other specified depressive episodes: Secondary | ICD-10-CM | POA: Insufficient documentation

## 2012-10-12 DIAGNOSIS — I10 Essential (primary) hypertension: Secondary | ICD-10-CM | POA: Insufficient documentation

## 2012-10-12 DIAGNOSIS — Z8679 Personal history of other diseases of the circulatory system: Secondary | ICD-10-CM | POA: Insufficient documentation

## 2012-10-12 DIAGNOSIS — R109 Unspecified abdominal pain: Secondary | ICD-10-CM | POA: Insufficient documentation

## 2012-10-12 DIAGNOSIS — F411 Generalized anxiety disorder: Secondary | ICD-10-CM | POA: Insufficient documentation

## 2012-10-12 DIAGNOSIS — Z860100 Personal history of colon polyps, unspecified: Secondary | ICD-10-CM | POA: Insufficient documentation

## 2012-10-12 DIAGNOSIS — R197 Diarrhea, unspecified: Secondary | ICD-10-CM | POA: Insufficient documentation

## 2012-10-12 DIAGNOSIS — Z79899 Other long term (current) drug therapy: Secondary | ICD-10-CM | POA: Insufficient documentation

## 2012-10-12 DIAGNOSIS — Z7982 Long term (current) use of aspirin: Secondary | ICD-10-CM | POA: Insufficient documentation

## 2012-10-12 DIAGNOSIS — G510 Bell's palsy: Secondary | ICD-10-CM | POA: Insufficient documentation

## 2012-10-12 DIAGNOSIS — K921 Melena: Secondary | ICD-10-CM | POA: Insufficient documentation

## 2012-10-12 DIAGNOSIS — E785 Hyperlipidemia, unspecified: Secondary | ICD-10-CM | POA: Insufficient documentation

## 2012-10-12 LAB — CBC WITH DIFFERENTIAL/PLATELET
Basophils Absolute: 0 10*3/uL (ref 0.0–0.1)
Basophils Relative: 0 % (ref 0–1)
Eosinophils Absolute: 0.1 10*3/uL (ref 0.0–0.7)
MCH: 31.1 pg (ref 26.0–34.0)
MCHC: 35 g/dL (ref 30.0–36.0)
Neutro Abs: 9.6 10*3/uL — ABNORMAL HIGH (ref 1.7–7.7)
Neutrophils Relative %: 73 % (ref 43–77)
RDW: 12.6 % (ref 11.5–15.5)

## 2012-10-12 LAB — COMPREHENSIVE METABOLIC PANEL
AST: 21 U/L (ref 0–37)
Albumin: 3.4 g/dL — ABNORMAL LOW (ref 3.5–5.2)
Alkaline Phosphatase: 39 U/L (ref 39–117)
BUN: 12 mg/dL (ref 6–23)
Chloride: 97 mEq/L (ref 96–112)
Creatinine, Ser: 0.85 mg/dL (ref 0.50–1.10)
Potassium: 3.6 mEq/L (ref 3.5–5.1)
Total Bilirubin: 0.4 mg/dL (ref 0.3–1.2)
Total Protein: 6.3 g/dL (ref 6.0–8.3)

## 2012-10-12 LAB — OCCULT BLOOD, POC DEVICE: Fecal Occult Bld: POSITIVE — AB

## 2012-10-12 MED ORDER — SODIUM CHLORIDE 0.9 % IV BOLUS (SEPSIS)
1000.0000 mL | Freq: Once | INTRAVENOUS | Status: AC
  Administered 2012-10-12: 1000 mL via INTRAVENOUS

## 2012-10-12 NOTE — ED Notes (Signed)
Pt states she had a syncopal episode last night  States she woke up in her living room floor  States she did not hurt anything when she fell

## 2012-10-12 NOTE — ED Notes (Signed)
Pt states she is having lower abd cramping that comes and goes  Pt states it started about 2am  Pt states she noticed a little blood in her stool yesterday and passed a small amt today  States she noticed some in her panties and had some on the tissue when she wiped

## 2012-10-12 NOTE — ED Notes (Signed)
Pt present to ED after noticing blood per rectum on last night (0200). Passed gas and noticed small amount  blood in her pants and in the commode. She c/o cramping, weakness, dizziness with syncopal episode last night. She states she does not remember going down but remember getting up off the floor. She lives home alone. She noticed has not noticed leg or muscle aches since syncope episode.

## 2012-10-12 NOTE — ED Provider Notes (Addendum)
History     CSN: [id number]  Arrival date & time 10/12/12  2043   First MD Initiated Contact with Patient 10/12/12 2116      Chief Complaint  Patient presents with  . Abdominal Pain  . Rectal Bleeding    (Consider location/radiation/quality/duration/timing/severity/associated sxs/prior treatment) The history is provided by the patient.  Lisa Cox is a 66 y.o. female hx of HTN, HL, ductal breast Ca s/p radiation here with lower abdominal cramps and rectal bleeding. She noticed lower abdominal cramps since yesterday, intermittent. No fevers or vomiting. Yesterday he noted some blood in her stool when she wiped. Today she had some cramps and then noticed an episode of bright red blood. She also noted that she passed out yesterday and had some diarrhea. Denies any chest pain or shortness of breath and has no history of CHF or CAD. She had a similar episode a year ago and had a colonoscopy that showed hemorrhoids and one polyp that was taken out.    Past Medical History  Diagnosis Date  . Hypertension   . Bell's palsy 2009  . Hyperlipidemia   . Palpitations     hx of  . Cancer Feb.2011    breast,lumpectomy right side followed by radiation  . GERD (gastroesophageal reflux disease)   . Depression   . Anxiety     Past Surgical History  Procedure Date  . Tubal ligation 1978  . Breast lumpectomy Feb. 2011    right breast,followed by radiation therapy  . Eye surgery 1953    skin removal in eye    Family History  Problem Relation Age of Onset  . Cancer Father     pancreatic  . Heart disease Father   . Hypertension Sister   . Hypertension Brother     History  Substance Use Topics  . Smoking status: Never Smoker   . Smokeless tobacco: Not on file  . Alcohol Use: No    OB History    Grav Para Term Preterm Abortions TAB SAB Ect Mult Living                  Review of Systems  Gastrointestinal: Positive for abdominal pain, blood in stool and hematochezia.   Neurological: Positive for syncope.  All other systems reviewed and are negative.    Allergies  Crestor; Erythromycin; Niacin and related; Restoril; and Zolpidem tartrate  Home Medications   Current Outpatient Rx  Name  Route  Sig  Dispense  Refill  . ACETAMINOPHEN 500 MG PO TABS   Oral   Take 1,000 mg by mouth every 6 (six) hours as needed. For cramping.         . ASPIRIN 81 MG PO TABS   Oral   Take 81 mg by mouth once. Does not take on a daily basis         . ATENOLOL 25 MG PO TABS   Oral   Take 25 mg by mouth daily.          . B COMPLEX PO TABS   Oral   Take 1 tablet by mouth daily.          Marland Kitchen CARBOXYMETHYLCELLULOSE SODIUM 0.5 % OP SOLN   Ophthalmic   Apply 1 drop to eye 3 (three) times daily as needed.         Marland Kitchen DIMENHYDRINATE 50 MG PO TABS   Oral   Take 50 mg by mouth at bedtime.         Marland Kitchen  LORAZEPAM 1 MG PO TABS   Oral   Take 0.5 tablets (0.5 mg total) by mouth 3 (three) times daily as needed. For anxiety and sleep   60 tablet   3   . FISH OIL CONCENTRATE PO   Oral   Take 1 capsule by mouth daily.          Marland Kitchen OMEPRAZOLE 40 MG PO CPDR   Oral   Take 40 mg by mouth every morning.           Marland Kitchen TAMOXIFEN CITRATE 20 MG PO TABS   Oral   Take 20 mg by mouth every morning.             BP 161/71  Pulse 85  Temp 98.5 F (36.9 C) (Oral)  Resp 20  SpO2 97%  Physical Exam  Nursing note and vitals reviewed. Constitutional: She is oriented to person, place, and time. She appears well-developed and well-nourished.  HENT:  Head: Normocephalic.  Mouth/Throat: Oropharynx is clear and moist.  Eyes: Conjunctivae normal are normal. Pupils are equal, round, and reactive to light.  Neck: Normal range of motion. Neck supple.  Cardiovascular: Normal rate, regular rhythm and normal heart sounds.   Pulmonary/Chest: Effort normal and breath sounds normal. No respiratory distress. She has no wheezes. She has no rales.  Abdominal: Soft. Bowel sounds are  normal. She exhibits no distension. There is no tenderness. There is no rebound.       Nontender. Rectal- small external hemorrhoids, not thrombosed. Some dried blood.   Musculoskeletal: Normal range of motion. She exhibits no edema and no tenderness.  Neurological: She is alert and oriented to person, place, and time.  Skin: Skin is warm and dry.  Psychiatric: She has a normal mood and affect. Her behavior is normal. Judgment and thought content normal.    ED Course  Procedures (including critical care time)  Labs Reviewed  CBC WITH DIFFERENTIAL - Abnormal; Notable for the following:    WBC 13.3 (*)     HCT 35.4 (*)     Neutro Abs 9.6 (*)     All other components within normal limits  COMPREHENSIVE METABOLIC PANEL - Abnormal; Notable for the following:    Sodium 131 (*)     Glucose, Bld 107 (*)     Albumin 3.4 (*)     GFR calc non Af Amer 69 (*)     GFR calc Af Amer 80 (*)     All other components within normal limits  OCCULT BLOOD, POC DEVICE - Abnormal; Notable for the following:    Fecal Occult Bld POSITIVE (*)     All other components within normal limits   No results found.   No diagnosis found.   Date: 10/12/2012  Rate: 82  Rhythm: normal sinus rhythm  QRS Axis: normal  Intervals: normal  ST/T Wave abnormalities: normal  Conduction Disutrbances:none  Narrative Interpretation:   Old EKG Reviewed: unchanged     MDM  Lisa Cox is a 66 y.o. female here with syncope, ab pain, rectal bleeding. I think she might be bleeding from the hemorrhoid or from gastroenteritis. Abdomen nontender. She is slightly dehydrated. Will get orthostatics, check CBC and give IVF. EKG unremarkable.   11:30 PM Patient not orthostatic. Felt better after hydration. Occ positive, likely from hemorrhoids vs gastroenteritis. Hg stable. I think the abdominal cramps is likely viral gastro and she is showing no signs of SBO or localizing tenderness. I think syncope is likely from  dehydration as she is not orthostatic. She has nl EKG and I am not concerned about cardiac causes of syncope. Recommend PO hydration, imodium prn, and sitz bath for hemorrhoids. Return precautions given.          Richardean Canal, MD 10/12/12 2332  Richardean Canal, MD 10/12/12 1610  Richardean Canal, MD 10/12/12 [phone]

## 2012-10-16 ENCOUNTER — Other Ambulatory Visit: Payer: Self-pay

## 2012-10-16 MED ORDER — ATENOLOL 25 MG PO TABS: 25.0000 mg | ORAL_TABLET | Freq: Every day | ORAL | Status: DC

## 2012-11-07 ENCOUNTER — Encounter (INDEPENDENT_AMBULATORY_CARE_PROVIDER_SITE_OTHER): Payer: Self-pay | Admitting: Surgery

## 2012-11-24 ENCOUNTER — Other Ambulatory Visit: Payer: Self-pay | Admitting: Oncology

## 2012-11-24 ENCOUNTER — Other Ambulatory Visit: Payer: Self-pay | Admitting: Cardiology

## 2012-11-24 DIAGNOSIS — Z853 Personal history of malignant neoplasm of breast: Secondary | ICD-10-CM

## 2012-12-06 ENCOUNTER — Other Ambulatory Visit: Payer: Self-pay

## 2012-12-07 ENCOUNTER — Other Ambulatory Visit: Payer: Self-pay | Admitting: Oncology

## 2012-12-12 ENCOUNTER — Ambulatory Visit
Admission: RE | Admit: 2012-12-12 | Discharge: 2012-12-12 | Disposition: A | Discharge status: Home / Self Care | Payer: Medicare Other | Source: Ambulatory Visit | Attending: Cardiology | Admitting: Cardiology

## 2012-12-12 DIAGNOSIS — Z853 Personal history of malignant neoplasm of breast: Secondary | ICD-10-CM

## 2013-01-02 ENCOUNTER — Ambulatory Visit (INDEPENDENT_AMBULATORY_CARE_PROVIDER_SITE_OTHER): Payer: Medicare Other | Admitting: Surgery

## 2013-01-02 ENCOUNTER — Encounter (INDEPENDENT_AMBULATORY_CARE_PROVIDER_SITE_OTHER): Payer: Self-pay | Admitting: Surgery

## 2013-01-02 VITALS — BP 140/82 | HR 72 | Temp 98.0°F | Resp 18 | Ht 66.0 in | Wt 159.6 lb

## 2013-01-02 DIAGNOSIS — Z853 Personal history of malignant neoplasm of breast: Secondary | ICD-10-CM

## 2013-01-02 NOTE — Progress Notes (Signed)
NAME: Avalin A Veach       DOB: [birth year]           DATE: 01/02/2013       MRN: [id number]   Lisa Cox is a 67 y.o.Marland Kitchenfemale who presents for routine followup of her Right breast DCIS diagnosed in 11/2009 and treated with LUMPECTOMY She has no problems or concerns on either side.  PFSH: She has had no significant changes since the last visit here.  ROS: There have been no significant changes since the last visit here  EXAM: General: The patient is alert, oriented, generally healty appearing, NAD. Mood and affect are normal.  Breasts:  No masses bilaterally.    Lymphatics: She has no axillary or supraclavicular adenopathy on either side.  Extremities: Full ROM of the surgical side with no lymphedema noted.  Data Reviewed: Mammogram 11/2012 birads 2  Clinical Data: History of right breast cancer status post  lumpectomy 2011  DIGITAL DIAGNOSTIC BILATERAL MAMMOGRAM WITH CAD  Comparison: With priors  Findings:  ACR Breast Density Category 2: There is a scattered fibroglandular  pattern.  There are stable lumpectomy changes in the right breast. There is  no new suspicious mass or malignant-type microcalcifications in  either breast.  Mammographic images were processed with CAD.  IMPRESSION:  No evidence of malignancy in either breast.  RECOMMENDATION:  Bilateral diagnostic mammogram in 1 year is recommended.  I have discussed the findings and recommendations with the patient.  Results were also provided in writing at the conclusion of the  visit.   Impression: Doing well, with no evidence of recurrent cancer or new cancer  Plan: Will continue to follow up on an annual basis here.

## 2013-01-02 NOTE — Patient Instructions (Signed)
Follow up 1 year 

## 2013-01-15 ENCOUNTER — Ambulatory Visit: Payer: Medicare Other | Admitting: Oncology

## 2013-01-16 ENCOUNTER — Other Ambulatory Visit (HOSPITAL_BASED_OUTPATIENT_CLINIC_OR_DEPARTMENT_OTHER): Payer: Medicare Other | Admitting: Lab

## 2013-01-16 ENCOUNTER — Telehealth: Payer: Self-pay | Admitting: Oncology

## 2013-01-16 ENCOUNTER — Ambulatory Visit (HOSPITAL_BASED_OUTPATIENT_CLINIC_OR_DEPARTMENT_OTHER): Payer: Medicare Other | Admitting: Oncology

## 2013-01-16 ENCOUNTER — Encounter: Payer: Self-pay | Admitting: Oncology

## 2013-01-16 VITALS — BP 131/72 | HR 78 | Temp 98.2°F | Resp 20 | Ht 66.0 in | Wt 160.0 lb

## 2013-01-16 DIAGNOSIS — D0590 Unspecified type of carcinoma in situ of unspecified breast: Secondary | ICD-10-CM

## 2013-01-16 DIAGNOSIS — Z853 Personal history of malignant neoplasm of breast: Secondary | ICD-10-CM

## 2013-01-16 LAB — CBC WITH DIFFERENTIAL/PLATELET
BASO%: 0.6 % (ref 0.0–2.0)
Basophils Absolute: 0 10*3/uL (ref 0.0–0.1)
Eosinophils Absolute: 0.3 10*3/uL (ref 0.0–0.5)
HCT: 37.1 % (ref 34.8–46.6)
HGB: 12.3 g/dL (ref 11.6–15.9)
LYMPH%: 35.1 % (ref 14.0–49.7)
MCHC: 33.3 g/dL (ref 31.5–36.0)
MONO#: 0.6 10*3/uL (ref 0.1–0.9)
NEUT%: 52.3 % (ref 38.4–76.8)
Platelets: 181 10*3/uL (ref 145–400)
WBC: 7.6 10*3/uL (ref 3.9–10.3)
lymph#: 2.7 10*3/uL (ref 0.9–3.3)

## 2013-01-16 LAB — COMPREHENSIVE METABOLIC PANEL (CC13)
ALT: 14 U/L (ref 0–55)
BUN: 12.6 mg/dL (ref 7.0–26.0)
CO2: 29 mEq/L (ref 22–29)
Calcium: 9.2 mg/dL (ref 8.4–10.4)
Chloride: 103 mEq/L (ref 98–107)
Creatinine: 0.8 mg/dL (ref 0.6–1.1)
Glucose: 93 mg/dl (ref 70–99)
Total Bilirubin: 0.33 mg/dL (ref 0.20–1.20)

## 2013-01-16 NOTE — Progress Notes (Signed)
[facility]  Name: Lisa Cox                  DATE: 01/16/2013 MRN: [id number]                      DOB: 1946/01/05  DIAGNOSIS: Patient Active Problem List  Diagnoses Date Noted  . Cough 10/05/2011  . Hypotension 09/29/2011  . Bronchitis 09/29/2011  . History of breast cancer (ductal carcinoma in situ) 09/29/2011  . Thrombocytopenia 09/29/2011  . Syncope and collapse 09/28/2011  . Hypercholesterolemia 07/11/2011  . Benign hypertensive heart disease without heart failure 07/11/2011  . Palpitations      Encounter Diagnosis  Name Primary?  . History of breast cancer Yes    PREVIOUS THERAPY:  Right lumpectomy Feb, 2011 followed by radiation therapy.   CURRENT THERAPY:  Tamoxifen 20 mg daily, began May, 2011.   INTERIM HISTORY: 67 year old female who returns today for followup visit. Overall she's doing well she is tolerating tamoxifen without any significant complaints. She denies any nausea vomiting fevers chills night sweats headaches no shortness of breath chest pains or palpitations no myalgias and arthralgias no vaginal bleeding or discharge no visual disturbances. Remainder of the 10 point review of systems is negative.  PHYSICAL EXAM: BP 131/72  Pulse 78  Temp(Src) 98.2 F (36.8 C) (Oral)  Resp 20  Ht 5\' 6"  (1.676 m)  Wt 160 lb (72.576 kg)  BMI 25.84 kg/m2 General: Well developed, well nourished, in no acute distress.  EENT: No ocular or oral lesions. No stomatitis.  Respiratory: Lungs are clear to auscultation bilaterally with normal respiratory movement and no accessory muscle use. Cardiac: No murmur, rub or tachycardia. No upper or lower extremity edema.  GI: Abdomen is soft, no palpable hepatosplenomegaly. No fluid wave. No tenderness. Musculoskeletal: No kyphosis, no tenderness over the spine, ribs or hips. Lymph: No cervical, infraclavicular, axillary or inguinal adenopathy. Neuro: No focal neurological deficits. Psych: Alert and oriented X 3,  appropriate mood and affect.  BREAST EXAM: In the supine position, with the right arm over the head, the right nipple is everted. No periareolar edema or nipple discharge. No mass in any quadrant or subareolar region. From the 11 o'clock to 2 o'clock position, a curvilinear scar, well healed. No redness of the skin. No right axillary adenopathy. The right breast is smaller in volume than the left owing to lumpectomy and radiation therapy. With the left arm over the head, the left nipple is everted. No periareolar edema or nipple discharge. No mass in any quadrant or subareolar region. No redness of the skin. No left axillary adenopathy.    LABORATORY STUDIES:   Results for orders placed in visit on 01/16/13  CBC WITH DIFFERENTIAL      Result Value Range   WBC 7.6  3.9 - 10.3 10e3/uL   NEUT# 4.0  1.5 - 6.5 10e3/uL   HGB 12.3  11.6 - 15.9 g/dL   HCT 91.4  78.2 - 95.6 %   Platelets 181  145 - 400 10e3/uL   MCV 90.9  79.5 - 101.0 fL   MCH 30.3  25.1 - 34.0 pg   MCHC 33.3  31.5 - 36.0 g/dL   RBC 2.13  0.86 - 5.78 10e6/uL   RDW 12.7  11.2 - 14.5 %   lymph# 2.7  0.9 - 3.3 10e3/uL   MONO# 0.6  0.1 - 0.9 10e3/uL   Eosinophils Absolute 0.3  0.0 - 0.5 10e3/uL  Basophils Absolute 0.0  0.0 - 0.1 10e3/uL   NEUT% 52.3  38.4 - 76.8 %   LYMPH% 35.1  14.0 - 49.7 %   MONO% 7.7  0.0 - 14.0 %   EOS% 4.3  0.0 - 7.0 %   BASO% 0.6  0.0 - 2.0 %  COMPREHENSIVE METABOLIC PANEL (CC13)      Result Value Range   Sodium 140  136 - 145 mEq/L   Potassium 4.1  3.5 - 5.1 mEq/L   Chloride 103  98 - 107 mEq/L   CO2 29  22 - 29 mEq/L   Glucose 93  70 - 99 mg/dl   BUN 16.1  7.0 - 09.6 mg/dL   Creatinine 0.8  0.6 - 1.1 mg/dL   Total Bilirubin 0.45  0.20 - 1.20 mg/dL   Alkaline Phosphatase 44  40 - 150 U/L   AST 17  5 - 34 U/L   ALT 14  0 - 55 U/L   Total Protein 6.6  6.4 - 8.3 g/dL   Albumin 3.4 (*) 3.5 - 5.0 g/dL   Calcium 9.2  8.4 - 40.9 mg/dL    IMPRESSION:  67 year old female with: 1. History DCIS Feb.  2011, clinically no evidence of recurrence.  2. Last mammo 11/26/11, no evidence of recurrence.  3. On Tamoxifen since May 2011 with good tolerance.   PLAN:   #1 patient will continue to be followed every 8 months with [place].  #2 she will have a CBC and CMET on her next visit.   patient knows to call me with any problems questions or concerns. I spent 25 minutes face to face time 50% of which was counseled     Drue Second, MD Medical/Oncology [facility] [phone] (beeper) [phone] (Office)

## 2013-01-16 NOTE — Patient Instructions (Addendum)
Doing well continue tamoxifen  I will see you back in 8 months

## 2013-01-16 NOTE — Telephone Encounter (Signed)
gv pt appt schedule for November.  °

## 2013-01-30 ENCOUNTER — Other Ambulatory Visit: Payer: Self-pay | Admitting: *Deleted

## 2013-01-30 DIAGNOSIS — F419 Anxiety disorder, unspecified: Secondary | ICD-10-CM

## 2013-01-30 MED ORDER — LORAZEPAM 1 MG PO TABS: 1.0000 mg | ORAL_TABLET | Freq: Two times a day (BID) | ORAL | Status: DC | PRN

## 2013-01-30 NOTE — Telephone Encounter (Signed)
Called pharmacy and Rx at [facility] says lorazepam 1 mg bid prn. Tried to call patient, unable to reach. Will forward to  Dr. Patty Sermons to see if ok to fill with these directions.

## 2013-01-30 NOTE — Telephone Encounter (Signed)
Pt needs a refill on Lorazepam. Will route to Dr. Jenness Corner Nurse [phone]

## 2013-01-30 NOTE — Telephone Encounter (Signed)
Okay to refill lorazepam

## 2013-03-02 ENCOUNTER — Telehealth: Payer: Self-pay | Admitting: Cardiology

## 2013-03-02 NOTE — Telephone Encounter (Signed)
New Prob     Pt has some questions regarding her upcoming appt and her medication (ATIVAN). Would like to speak to nurse.

## 2013-03-02 NOTE — Telephone Encounter (Signed)
Spoke with patient and she stated when she received her Ativan in April it did not have any refills. Refills called to pharmacy . Also with her Atenolol she takes 25 mg daily and uses an extra 25 mg as needed, this was corrected at pharmacy as well.

## 2013-04-21 ENCOUNTER — Ambulatory Visit: Payer: Medicare Other | Admitting: Cardiology

## 2013-05-20 ENCOUNTER — Encounter: Payer: Self-pay | Admitting: Cardiology

## 2013-05-20 ENCOUNTER — Ambulatory Visit (INDEPENDENT_AMBULATORY_CARE_PROVIDER_SITE_OTHER): Payer: Medicare Other | Admitting: Cardiology

## 2013-05-20 VITALS — BP 142/78 | HR 74 | Ht 65.5 in | Wt 159.4 lb

## 2013-05-20 DIAGNOSIS — R002 Palpitations: Secondary | ICD-10-CM

## 2013-05-20 DIAGNOSIS — K921 Melena: Secondary | ICD-10-CM

## 2013-05-20 DIAGNOSIS — I119 Hypertensive heart disease without heart failure: Secondary | ICD-10-CM

## 2013-05-20 DIAGNOSIS — E7800 Pure hypercholesterolemia, unspecified: Secondary | ICD-10-CM

## 2013-05-20 LAB — CBC WITH DIFFERENTIAL/PLATELET
Basophils Relative: 0.6 % (ref 0.0–3.0)
Eosinophils Relative: 2.7 % (ref 0.0–5.0)
HCT: 37.4 % (ref 36.0–46.0)
Lymphs Abs: 2.4 10*3/uL (ref 0.7–4.0)
Monocytes Relative: 9 % (ref 3.0–12.0)
Neutrophils Relative %: 46.8 % (ref 43.0–77.0)
Platelets: 186 10*3/uL (ref 150.0–400.0)
RBC: 3.99 Mil/uL (ref 3.87–5.11)
WBC: 5.8 10*3/uL (ref 4.5–10.5)

## 2013-05-20 LAB — HEPATIC FUNCTION PANEL
ALT: 15 U/L (ref 0–35)
Albumin: 3.7 g/dL (ref 3.5–5.2)
Bilirubin, Direct: 0 mg/dL (ref 0.0–0.3)
Total Protein: 6.6 g/dL (ref 6.0–8.3)

## 2013-05-20 LAB — LIPID PANEL
Cholesterol: 162 mg/dL (ref 0–200)
HDL: 40.7 mg/dL (ref >39.00)
LDL Cholesterol: 86 mg/dL (ref 0–99)
Triglycerides: 175 mg/dL — ABNORMAL HIGH (ref 0.0–149.0)
VLDL: 35 mg/dL (ref 0.0–40.0)

## 2013-05-20 LAB — BASIC METABOLIC PANEL
BUN: 10 mg/dL (ref 6–23)
Chloride: 100 mEq/L (ref 96–112)
GFR: 71.36 mL/min (ref >60.00)
Potassium: 4.8 mEq/L (ref 3.5–5.1)

## 2013-05-20 NOTE — Assessment & Plan Note (Signed)
Patient has a history of hypercholesterolemia managed with diet.  She has an intolerance to statins

## 2013-05-20 NOTE — Patient Instructions (Signed)
Will obtain labs today and call you with the results (lp/bmet/hfp/cbc)  Your physician recommends that you continue on your current medications as directed. Please refer to the Current Medication list given to you today.  Your physician wants you to follow-up in: 6 months with fasting labs (lp/bmet/hfp) You will receive a reminder letter in the mail two months in advance. If you don't receive a letter, please call our office to schedule the follow-up appointment.  

## 2013-05-20 NOTE — Progress Notes (Signed)
Quick Note:  Please report to patient. The recent labs are stable. Continue same medication and careful diet. No anemia. ______ 

## 2013-05-20 NOTE — Assessment & Plan Note (Signed)
Blood pressure was remaining stable on current therapy 

## 2013-05-20 NOTE — Progress Notes (Signed)
Lisa Cox Date of Birth:  1946/03/11 [facility] [address] [place], [place]  [address] [phone]  Fax   [phone]  HPI: This pleasant 67 year old woman is seen for a scheduled followup office visit. She has a past history of palpitations and a history of hypercholesterolemia. Since last visit she's been doing well no new cardiac complaints. She is retired now and she stays busy taking after several grandchildren on a regular basis. She has a past history of breast cancer discovered 2 years ago. She is on tamoxifen which she would be on for another 3 years.  In December she had an episode of rectal bleeding followed by syncope.  She went to the emergency room and was checked for syncope and discharged.  She has had no further syncopal episodes.  She has noted occasional blood in the stool.  She had a colonoscopy 2 years ago.  Her symptoms seem to be worse after she eats nuts.  She may have some diverticulosis.   Current Outpatient Prescriptions  Medication Sig Dispense Refill  . acetaminophen (TYLENOL) 500 MG tablet Take 1,000 mg by mouth every 6 (six) hours as needed. For cramping.      Marland Kitchen aspirin 81 MG tablet Take 81 mg by mouth once. Does not take on a daily basis      . atenolol (TENORMIN) 25 MG tablet Take 25 mg by mouth daily. And ok to use extra tablet as needed      . b complex vitamins tablet Take 1 tablet by mouth daily. Takes sometimes      . carboxymethylcellulose (REFRESH TEARS) 0.5 % SOLN Apply 1 drop to eye 3 (three) times daily as needed.      . dimenhyDRINATE (DRAMAMINE) 50 MG tablet Take 50 mg by mouth at bedtime.      Marland Kitchen LORazepam (ATIVAN) 1 MG tablet Take 1 tablet (1 mg total) by mouth 2 (two) times daily as needed. For anxiety and sleep  60 tablet  5  . omeprazole (PRILOSEC) 40 MG capsule Take 40 mg by mouth every morning.        . tamoxifen (NOLVADEX) 20 MG tablet TAKE 1 TABLET BY MOUTH DAILY  30 tablet  11  . Omega-3 Fatty Acids (FISH  OIL CONCENTRATE PO) Take 1 capsule by mouth daily.        No current facility-administered medications for this visit.    Allergies  Allergen Reactions  . Crestor (Rosuvastatin Calcium)     Leg cramps  . Erythromycin     nausea  . Niacin And Related Itching  . Restoril     Reaction-"nervous"  . Zolpidem Tartrate     Reaction-"nervous"    Patient Active Problem List   Diagnosis Date Noted  . Hematochezia 05/20/2013  . History of breast cancer 09/29/2011  . Hypercholesterolemia 07/11/2011  . Benign hypertensive heart disease without heart failure 07/11/2011  . Palpitations     History  Smoking status  . Never Smoker   Smokeless tobacco  . Not on file    History  Alcohol Use No    Family History  Problem Relation Age of Onset  . Cancer Father     pancreatic  . Heart disease Father   . Hypertension Sister   . Hypertension Brother     Review of Systems: The patient denies any heat or cold intolerance.  No weight gain or weight loss.  The patient denies headaches or blurry vision.  There is no cough  or sputum production.  The patient denies dizziness.  There is no hematuria or hematochezia.  The patient denies any muscle aches or arthritis.  The patient denies any rash.  The patient denies frequent falling or instability.  There is no history of depression or anxiety.  All other systems were reviewed and are negative.   Physical Exam: Filed Vitals:   05/20/13 0951  BP: 142/78  Pulse: 74   the general appearance reveals a well-developed well-nourished woman in no distress.The head and neck exam reveals pupils equal and reactive.  Extraocular movements are full.  There is no scleral icterus.  The mouth and pharynx are normal.  The neck is supple.  The carotids reveal no bruits.  The jugular venous pressure is normal.  The  thyroid is not enlarged.  There is no lymphadenopathy.  The chest is clear to percussion and auscultation.  There are no rales or rhonchi.   Expansion of the chest is symmetrical.  The precordium is quiet.  The first heart sound is normal.  The second heart sound is physiologically split.  There is no murmur gallop rub or click.  There is no abnormal lift or heave.  The abdomen is soft and nontender.  The bowel sounds are normal.  The liver and spleen are not enlarged.  There are no abdominal masses.  There are no abdominal bruits.  Extremities reveal good pedal pulses.  There is no phlebitis or edema.  There is no cyanosis or clubbing.  Strength is normal and symmetrical in all extremities.  There is no lateralizing weakness.  There are no sensory deficits.  The skin is warm and dry.  There is no rash.   EKG shows normal sinus rhythm and is within normal limits and is unchanged from 10/12/12   Assessment / Plan: Continue same medication.  Blood work today pending.  Recheck in 6 months for office visit lipid panel hepatic function panel and basal metabolic panel

## 2013-05-21 ENCOUNTER — Telehealth: Payer: Self-pay | Admitting: *Deleted

## 2013-05-21 NOTE — Telephone Encounter (Signed)
Message copied by Burnell Blanks on Thu May 21, 2013 12:14 PM ------      Message from: Cassell Clement      Created: Wed May 20, 2013  8:55 PM       Please report to patient.  The recent labs are stable. Continue same medication and careful diet. No anemia. ------

## 2013-05-21 NOTE — Telephone Encounter (Signed)
Mailed copy of labs and left message to call if any questions  

## 2013-05-27 ENCOUNTER — Other Ambulatory Visit: Payer: Self-pay

## 2013-08-03 LAB — IFOBT (OCCULT BLOOD): IFOBT: NEGATIVE

## 2013-08-27 ENCOUNTER — Other Ambulatory Visit: Payer: Self-pay

## 2013-09-07 ENCOUNTER — Telehealth: Payer: Self-pay | Admitting: Cardiology

## 2013-09-07 DIAGNOSIS — F419 Anxiety disorder, unspecified: Secondary | ICD-10-CM

## 2013-09-07 MED ORDER — LORAZEPAM 1 MG PO TABS: 1.0000 mg | ORAL_TABLET | Freq: Two times a day (BID) | ORAL | Status: DC | PRN

## 2013-09-07 NOTE — Telephone Encounter (Signed)
New Problem  Pt called states that her Bp was high over the weekend to 175/79/// Pt requests a call back to discuss

## 2013-09-07 NOTE — Telephone Encounter (Signed)
Patient would also like refill of Ativan - is it okay to send this?

## 2013-09-07 NOTE — Telephone Encounter (Signed)
Ativan refill called in to [facility]

## 2013-09-07 NOTE — Telephone Encounter (Signed)
Okay to refill Ativan? 

## 2013-09-07 NOTE — Telephone Encounter (Signed)
Spoke with patient who states she had high blood pressure over the weekend, specifically Saturday night:   175/79, 159/82, 175/79, 161/67 Patient states she checked her BP because her head felt funny (described as "tightness" in her head).  Patient took one of her sister's Benzapril.  Patient states she felt better after taking the medication and today BP is 122/59; patient denies h/a today.  Patient denies that she experienced facial drooping, weakness, difficulty walking or talking at the time.  Denies that she had chest pain, SOB, or nausea associated with elevated BP.  Patient states she did feel some discomfort between her shoulder blades but it resolved quickly.  Patient denies increased sodium intake over the weekend and denies use of decongestant medications.  I advised patient to keep a log of her BP this week and to call and report on Friday.  I advised patient not to take any other medications not prescribed to her and verified that patient continues to take Tenormin.  Patient verbalized understanding and agreement with plan.  I am routing message to Dr. Patty Sermons to determine if he has further advice.

## 2013-09-11 ENCOUNTER — Ambulatory Visit (HOSPITAL_BASED_OUTPATIENT_CLINIC_OR_DEPARTMENT_OTHER): Payer: Medicare Other | Admitting: Oncology

## 2013-09-11 ENCOUNTER — Encounter: Payer: Self-pay | Admitting: Oncology

## 2013-09-11 ENCOUNTER — Other Ambulatory Visit (HOSPITAL_BASED_OUTPATIENT_CLINIC_OR_DEPARTMENT_OTHER): Payer: Medicare Other | Admitting: Lab

## 2013-09-11 VITALS — BP 151/75 | HR 78 | Temp 98.3°F | Resp 18 | Ht 65.0 in | Wt 159.2 lb

## 2013-09-11 DIAGNOSIS — D0590 Unspecified type of carcinoma in situ of unspecified breast: Secondary | ICD-10-CM

## 2013-09-11 DIAGNOSIS — Z853 Personal history of malignant neoplasm of breast: Secondary | ICD-10-CM

## 2013-09-11 DIAGNOSIS — C50911 Malignant neoplasm of unspecified site of right female breast: Secondary | ICD-10-CM

## 2013-09-11 LAB — COMPREHENSIVE METABOLIC PANEL (CC13)
ALT: 13 U/L (ref 0–55)
Anion Gap: 7 mEq/L (ref 3–11)
BUN: 11.1 mg/dL (ref 7.0–26.0)
CO2: 29 mEq/L (ref 22–29)
Calcium: 9.3 mg/dL (ref 8.4–10.4)
Chloride: 104 mEq/L (ref 98–109)
Creatinine: 0.8 mg/dL (ref 0.6–1.1)
Glucose: 93 mg/dl (ref 70–140)

## 2013-09-11 LAB — CBC WITH DIFFERENTIAL/PLATELET
Basophils Absolute: 0 10*3/uL (ref 0.0–0.1)
HCT: 37 % (ref 34.8–46.6)
HGB: 12.4 g/dL (ref 11.6–15.9)
MONO#: 0.6 10*3/uL (ref 0.1–0.9)
NEUT%: 42.6 % (ref 38.4–76.8)
Platelets: 163 10*3/uL (ref 145–400)
WBC: 6.4 10*3/uL (ref 3.9–10.3)
lymph#: 2.9 10*3/uL (ref 0.9–3.3)

## 2013-09-11 NOTE — Progress Notes (Signed)
[facility]  Name: Lisa Cox                  DATE: 09/11/2013 MRN: [id number]                      DOB: 04-30-1946  DIAGNOSIS: Patient Active Problem List  Diagnoses Date Noted  . Cough 10/05/2011  . Hypotension 09/29/2011  . Bronchitis 09/29/2011  . History of breast cancer (ductal carcinoma in situ) 09/29/2011  . Thrombocytopenia 09/29/2011  . Syncope and collapse 09/28/2011  . Hypercholesterolemia 07/11/2011  . Benign hypertensive heart disease without heart failure 07/11/2011  . Palpitations      No diagnosis found.  PREVIOUS THERAPY:  Right lumpectomy Feb, 2011 followed by radiation therapy.   CURRENT THERAPY: Tamoxifen 20 mg daily, began May, 2011.   INTERIM HISTORY: 67 year old female who returns today for followup visit. Overall she's doing well she is tolerating tamoxifen without any significant complaints. She denies any nausea vomiting fevers chills night sweats headaches no shortness of breath chest pains or palpitations no myalgias and arthralgias no vaginal bleeding or discharge no visual disturbances. Remainder of the 10 point review of systems is negative.  PHYSICAL EXAM: BP 151/75  Pulse 78  Temp(Src) 98.3 F (36.8 C) (Oral)  Resp 18  Ht 5\' 5"  (1.651 m)  Wt 159 lb 3.2 oz (72.213 kg)  BMI 26.49 kg/m2 General: Well developed, well nourished, in no acute distress.  EENT: No ocular or oral lesions. No stomatitis.  Respiratory: Lungs are clear to auscultation bilaterally with normal respiratory movement and no accessory muscle use. Cardiac: No murmur, rub or tachycardia. No upper or lower extremity edema.  GI: Abdomen is soft, no palpable hepatosplenomegaly. No fluid wave. No tenderness. Musculoskeletal: No kyphosis, no tenderness over the spine, ribs or hips. Lymph: No cervical, infraclavicular, axillary or inguinal adenopathy. Neuro: No focal neurological deficits. Psych: Alert and oriented X 3, appropriate mood and affect.  BREAST EXAM:  In the supine position, with the right arm over the head, the right nipple is everted. No periareolar edema or nipple discharge. No mass in any quadrant or subareolar region. From the 11 o'clock to 2 o'clock position, a curvilinear scar, well healed. No redness of the skin. No right axillary adenopathy. The right breast is smaller in volume than the left owing to lumpectomy and radiation therapy. With the left arm over the head, the left nipple is everted. No periareolar edema or nipple discharge. No mass in any quadrant or subareolar region. No redness of the skin. No left axillary adenopathy.    LABORATORY STUDIES:   Results for orders placed in visit on 09/11/13  CBC WITH DIFFERENTIAL      Result Value Range   WBC 6.4  3.9 - 10.3 10e3/uL   NEUT# 2.7  1.5 - 6.5 10e3/uL   HGB 12.4  11.6 - 15.9 g/dL   HCT 40.9  81.1 - 91.4 %   Platelets 163  145 - 400 10e3/uL   MCV 91.4  79.5 - 101.0 fL   MCH 30.6  25.1 - 34.0 pg   MCHC 33.5  31.5 - 36.0 g/dL   RBC 7.82  9.56 - 2.13 10e6/uL   RDW 12.8  11.2 - 14.5 %   lymph# 2.9  0.9 - 3.3 10e3/uL   MONO# 0.6  0.1 - 0.9 10e3/uL   Eosinophils Absolute 0.2  0.0 - 0.5 10e3/uL   Basophils Absolute 0.0  0.0 - 0.1 10e3/uL  NEUT% 42.6  38.4 - 76.8 %   LYMPH% 44.8  14.0 - 49.7 %   MONO% 10.0  0.0 - 14.0 %   EOS% 2.3  0.0 - 7.0 %   BASO% 0.3  0.0 - 2.0 %    IMPRESSION:  67 year old female with: 1. History DCIS Feb. 2011, clinically no evidence of recurrence.  2. Last mammo, no evidence of recurrence.  3. On Tamoxifen since May 2011 with good tolerance.   PLAN:    #1 patient will continue to be followed every 8 months with [place].  #2 she will have a CBC and CMET on her next visit.   patient knows to call me with any problems questions or concerns. I spent 15 minutes face to face time 50% of which was counseled    Drue Second, MD Medical/Oncology [facility] [phone] (beeper) [phone] (Office)

## 2013-09-11 NOTE — Telephone Encounter (Signed)
appts made and printed...td 

## 2013-09-14 ENCOUNTER — Telehealth: Payer: Self-pay | Admitting: Cardiology

## 2013-09-14 DIAGNOSIS — Z79899 Other long term (current) drug therapy: Secondary | ICD-10-CM

## 2013-09-14 MED ORDER — LOSARTAN POTASSIUM 50 MG PO TABS: 50.0000 mg | ORAL_TABLET | Freq: Every day | ORAL | Status: DC

## 2013-09-14 NOTE — Telephone Encounter (Signed)
Advised patient and scheduled follow up labs  

## 2013-09-14 NOTE — Telephone Encounter (Signed)
Continue beta blocker and add losartan 50 mg one daily to improve blood pressure control.  Check a basal metabolic panel in 7-10 days

## 2013-09-14 NOTE — Telephone Encounter (Signed)
Follow Up   Pt sending BP readings  161/72  11/24 @ 8am 161/73  11/23 @ 8pm 144/72  11/22  145/64  11/21 136/68  11/20 144/73  11/19 149/73  11/18  Please call back to discuss if needed.

## 2013-09-14 NOTE — Telephone Encounter (Signed)
Will forward to  Dr. Brackbill  

## 2013-09-16 ENCOUNTER — Telehealth: Payer: Self-pay | Admitting: Cardiology

## 2013-09-16 MED ORDER — BENAZEPRIL HCL 20 MG PO TABS: 20.0000 mg | ORAL_TABLET | Freq: Every day | ORAL | Status: DC

## 2013-09-16 NOTE — Telephone Encounter (Signed)
Took yesterday and felt worse all day blood pressure up. Did take a Benazepril (thinks 20 mg) of her sisters and down to 129/55, has taken one of hers before and it worked well. Heart rate did go up to 80's yesterday. Will forward to  Dr. Patty Sermons for review

## 2013-09-16 NOTE — Telephone Encounter (Signed)
Advised patient, will call back with update  

## 2013-09-16 NOTE — Telephone Encounter (Signed)
New problem   Pt took LOSARTAIN 50 mg yesterday and her BP & Heart rate went up, pt began to feel bad.

## 2013-09-16 NOTE — Telephone Encounter (Signed)
Okay to stop losartan and switch to benazepril 20 mg one daily

## 2013-09-22 ENCOUNTER — Other Ambulatory Visit: Payer: Medicare Other

## 2013-09-25 ENCOUNTER — Other Ambulatory Visit (INDEPENDENT_AMBULATORY_CARE_PROVIDER_SITE_OTHER): Payer: Medicare Other

## 2013-09-25 DIAGNOSIS — Z79899 Other long term (current) drug therapy: Secondary | ICD-10-CM

## 2013-09-25 LAB — BASIC METABOLIC PANEL
Chloride: 103 mEq/L (ref 96–112)
Creatinine, Ser: 0.9 mg/dL (ref 0.4–1.2)
Potassium: 3.8 mEq/L (ref 3.5–5.1)
Sodium: 138 mEq/L (ref 135–145)

## 2013-09-27 NOTE — Progress Notes (Signed)
Quick Note:  Please report to patient. The recent labs are stable. Continue same medication and careful diet. ______ 

## 2013-09-28 ENCOUNTER — Telehealth: Payer: Self-pay | Admitting: *Deleted

## 2013-09-28 NOTE — Telephone Encounter (Signed)
Message copied by Burnell Blanks on Mon Sep 28, 2013 11:01 AM ------      Message from: Cassell Clement      Created: Sun Sep 27, 2013  4:38 PM       Please report to patient.  The recent labs are stable. Continue same medication and careful diet. ------

## 2013-09-28 NOTE — Telephone Encounter (Signed)
Advised patient of lab results  

## 2013-10-18 ENCOUNTER — Other Ambulatory Visit: Payer: Self-pay | Admitting: Cardiology

## 2013-11-16 ENCOUNTER — Encounter: Payer: Self-pay | Admitting: Cardiology

## 2013-11-16 ENCOUNTER — Other Ambulatory Visit: Payer: Medicare Other

## 2013-11-16 ENCOUNTER — Ambulatory Visit (INDEPENDENT_AMBULATORY_CARE_PROVIDER_SITE_OTHER): Payer: Medicare Other | Admitting: Cardiology

## 2013-11-16 VITALS — BP 160/78 | HR 73 | Ht 65.5 in | Wt 159.0 lb

## 2013-11-16 DIAGNOSIS — E7800 Pure hypercholesterolemia, unspecified: Secondary | ICD-10-CM

## 2013-11-16 DIAGNOSIS — I119 Hypertensive heart disease without heart failure: Secondary | ICD-10-CM

## 2013-11-16 DIAGNOSIS — R002 Palpitations: Secondary | ICD-10-CM

## 2013-11-16 LAB — LIPID PANEL
CHOL/HDL RATIO: 4
CHOLESTEROL: 146 mg/dL (ref 0–200)
HDL: 40.7 mg/dL (ref >39.00)
LDL CALC: 70 mg/dL (ref 0–99)
TRIGLYCERIDES: 178 mg/dL — AB (ref 0.0–149.0)
VLDL: 35.6 mg/dL (ref 0.0–40.0)

## 2013-11-16 LAB — HEPATIC FUNCTION PANEL
ALBUMIN: 3.7 g/dL (ref 3.5–5.2)
ALK PHOS: 34 U/L — AB (ref 39–117)
ALT: 15 U/L (ref 0–35)
AST: 20 U/L (ref 0–37)
BILIRUBIN DIRECT: 0 mg/dL (ref 0.0–0.3)
Total Bilirubin: 0.6 mg/dL (ref 0.3–1.2)
Total Protein: 6.5 g/dL (ref 6.0–8.3)

## 2013-11-16 LAB — BASIC METABOLIC PANEL
BUN: 11 mg/dL (ref 6–23)
CALCIUM: 8.8 mg/dL (ref 8.4–10.5)
CHLORIDE: 103 meq/L (ref 96–112)
CO2: 28 meq/L (ref 19–32)
CREATININE: 0.9 mg/dL (ref 0.4–1.2)
GFR: 65.81 mL/min (ref >60.00)
Glucose, Bld: 80 mg/dL (ref 70–99)
Potassium: 3.8 mEq/L (ref 3.5–5.1)
Sodium: 138 mEq/L (ref 135–145)

## 2013-11-16 MED ORDER — AMLODIPINE BESYLATE 5 MG PO TABS: 5.0000 mg | ORAL_TABLET | Freq: Every day | ORAL | Status: DC

## 2013-11-16 NOTE — Progress Notes (Signed)
Quick Note:  Please report to patient. The recent labs are stable. Continue same medication and careful diet. ______ 

## 2013-11-16 NOTE — Progress Notes (Signed)
Lisa Cox Date of Birth:  1946-06-10 [address] [place], [place]  [address] [phone]  Fax   [phone]  HPI: This pleasant 68 year old woman is seen for a scheduled followup office visit. She has a past history of palpitations and a history of hypercholesterolemia. Since last visit she's been doing well no new cardiac complaints. She is retired now and she stays busy taking after several grandchildren on a regular basis. She has a past history of breast cancer discovered 2 years ago. She is on tamoxifen which she would be on for another 3 years.  In December 2013 she had an episode of rectal bleeding followed by syncope.  She went to the emergency room and was checked for syncope and discharged.  She has had no further syncopal episodes.  She has noted occasional blood in the stool.  She had a colonoscopy 2 years ago.  Her symptoms seem to be worse after she eats nuts.  She may have some diverticulosis. She has had a history of fluctuating blood pressures.   Current Outpatient Prescriptions  Medication Sig Dispense Refill  . acetaminophen (TYLENOL) 500 MG tablet Take 1,000 mg by mouth every 6 (six) hours as needed. For cramping.      Marland Kitchen aspirin 81 MG tablet Take 81 mg by mouth once. Does not take on a daily basis      . atenolol (TENORMIN) 25 MG tablet TAKE 1 TABLET (25 MG TOTAL) BY MOUTH DAILY.  180 tablet  2  . b complex vitamins tablet Take 1 tablet by mouth daily. Takes sometimes      . carboxymethylcellulose (REFRESH TEARS) 0.5 % SOLN Apply 1 drop to eye 3 (three) times daily as needed.      . dimenhyDRINATE (DRAMAMINE) 50 MG tablet Take 50 mg by mouth at bedtime.      Marland Kitchen LORazepam (ATIVAN) 1 MG tablet Take 1 tablet (1 mg total) by mouth 2 (two) times daily as needed. For anxiety and sleep  60 tablet  5  . Omega-3 Fatty Acids (FISH OIL CONCENTRATE PO) Take 1 capsule by mouth daily.       Marland Kitchen omeprazole (PRILOSEC) 40 MG capsule Take 40 mg by mouth every morning.         . tamoxifen (NOLVADEX) 20 MG tablet TAKE 1 TABLET BY MOUTH DAILY  30 tablet  11  . amLODipine (NORVASC) 5 MG tablet Take 1 tablet (5 mg total) by mouth daily.  90 tablet  3   No current facility-administered medications for this visit.    Allergies  Allergen Reactions  . Crestor [Rosuvastatin Calcium]     Leg cramps  . Erythromycin     nausea  . Niacin And Related Itching  . Restoril     Reaction-"nervous"  . Zolpidem Tartrate     Reaction-"nervous"    Patient Active Problem List   Diagnosis Date Noted  . Hematochezia 05/20/2013  . History of breast cancer 09/29/2011  . Hypercholesterolemia 07/11/2011  . Benign hypertensive heart disease without heart failure 07/11/2011  . Palpitations     History  Smoking status  . Never Smoker   Smokeless tobacco  . Not on file    History  Alcohol Use No    Family History  Problem Relation Age of Onset  . Cancer Father     pancreatic  . Heart disease Father   . Hypertension Sister   . Hypertension Brother     Review of Systems: The  patient denies any heat or cold intolerance.  No weight gain or weight loss.  The patient denies headaches or blurry vision.  There is no cough or sputum production.  The patient denies dizziness.  There is no hematuria or hematochezia.  The patient denies any muscle aches or arthritis.  The patient denies any rash.  The patient denies frequent falling or instability.  There is no history of depression or anxiety.  All other systems were reviewed and are negative.   Physical Exam: Filed Vitals:   11/16/13 0927  BP: 160/78  Pulse: 73   the general appearance reveals a well-developed well-nourished woman in no distress.The head and neck exam reveals pupils equal and reactive.  Extraocular movements are full.  There is no scleral icterus.  The mouth and pharynx are normal.  The neck is supple.  The carotids reveal no bruits.  The jugular venous pressure is normal.  The  thyroid is not  enlarged.  There is no lymphadenopathy.  The chest is clear to percussion and auscultation.  There are no rales or rhonchi.  Expansion of the chest is symmetrical.  The precordium is quiet.  The first heart sound is normal.  The second heart sound is physiologically split.  There is no murmur gallop rub or click.  There is no abnormal lift or heave.  The abdomen is soft and nontender.  The bowel sounds are normal.  The liver and spleen are not enlarged.  There are no abdominal masses.  There are no abdominal bruits.  Extremities reveal good pedal pulses.  There is no phlebitis or edema.  There is no cyanosis or clubbing.  Strength is normal and symmetrical in all extremities.  There is no lateralizing weakness.  There are no sensory deficits.  The skin is warm and dry.  There is no rash.     Assessment / Plan: Stop benazepril and start amlodipine 5 mg one daily.  I told her that it might cause some mild ankle edema.  Blood work today pending.  Recheck in 6 months for office visit lipid panel hepatic function panel and basal metabolic panel

## 2013-11-16 NOTE — Assessment & Plan Note (Signed)
The patient is intolerant of statins.  She is on omega-3 fatty acids.  We are checking lab work today.  Her weight is unchanged since last visit 6 months ago

## 2013-11-16 NOTE — Assessment & Plan Note (Signed)
Her systolic blood pressures in the 160s today.  I rechecked it and is still 160.  She states that at home her blood pressure recently has been trending up into the 140s.  She does not think that the benazepril is agreeing with her.  She would like to try something different.  We will stop benazepril and switch to amlodipine 5 mg one daily

## 2013-11-16 NOTE — Assessment & Plan Note (Signed)
The patient has not had any recent palpitations or racing heart

## 2013-11-16 NOTE — Patient Instructions (Signed)
STOP BENAZEPRIL  START AMLODIPINE 5 MG DAILY, [facility]  Your physician wants you to follow-up in: 6 months with fasting labs (lp/bmet/hfp) You will receive a reminder letter in the mail two months in advance. If you don't receive a letter, please call our office to schedule the follow-up appointment.

## 2013-11-17 ENCOUNTER — Other Ambulatory Visit: Payer: Self-pay | Admitting: Oncology

## 2013-11-17 ENCOUNTER — Telehealth: Payer: Self-pay | Admitting: *Deleted

## 2013-11-17 DIAGNOSIS — Z9889 Other specified postprocedural states: Secondary | ICD-10-CM

## 2013-11-17 DIAGNOSIS — Z853 Personal history of malignant neoplasm of breast: Secondary | ICD-10-CM

## 2013-11-17 NOTE — Telephone Encounter (Signed)
Message copied by Earvin Hansen on Tue Nov 17, 2013  9:00 AM ------      Message from: Darlin Coco      Created: Mon Nov 16, 2013  9:41 PM       Please report to patient.  The recent labs are stable. Continue same medication and careful diet. ------

## 2013-11-17 NOTE — Telephone Encounter (Signed)
Advised patient of lab results  

## 2013-12-14 ENCOUNTER — Ambulatory Visit
Admission: RE | Admit: 2013-12-14 | Discharge: 2013-12-14 | Disposition: A | Discharge status: Home / Self Care | Payer: Medicare Other | Source: Ambulatory Visit | Attending: Oncology | Admitting: Oncology

## 2013-12-14 DIAGNOSIS — Z853 Personal history of malignant neoplasm of breast: Secondary | ICD-10-CM

## 2013-12-14 DIAGNOSIS — Z9889 Other specified postprocedural states: Secondary | ICD-10-CM

## 2013-12-28 ENCOUNTER — Ambulatory Visit (INDEPENDENT_AMBULATORY_CARE_PROVIDER_SITE_OTHER): Payer: Medicare Other | Admitting: Surgery

## 2013-12-28 ENCOUNTER — Encounter (INDEPENDENT_AMBULATORY_CARE_PROVIDER_SITE_OTHER): Payer: Self-pay | Admitting: Surgery

## 2013-12-28 VITALS — BP 126/70 | HR 75 | Temp 98.1°F | Resp 12 | Ht 65.0 in | Wt 158.0 lb

## 2013-12-28 DIAGNOSIS — Z853 Personal history of malignant neoplasm of breast: Secondary | ICD-10-CM

## 2013-12-28 NOTE — Patient Instructions (Signed)
Return as needed

## 2013-12-28 NOTE — Progress Notes (Signed)
NAME: Lisa Cox       DOB: [birth year]           DATE: 12/28/2013       MRN: [id number]   Lisa Cox is a 68 y.o.Marland Kitchenfemale who presents for routine followup of her Right breast DCIS diagnosed in 11/2009 and treated with LUMPECTOMY She has no problems or concerns on either side.  PFSH: She has had no significant changes since the last visit here.  ROS: There have been no significant changes since the last visit here  EXAM: General: The patient is alert, oriented, generally healty appearing, NAD. Mood and affect are normal.  Breasts:  No masses bilaterally.    Lymphatics: She has no axillary or supraclavicular adenopathy on either side.  Extremities: Full ROM of the surgical side with no lymphedema noted.  Data Reviewed: mmogram 11/2012 birads 2  Clinical Data: History of right breast cancer status post  lumpectomy 2011  DIGITAL DIAGNOSTIC BILATERAL MAMMOGRAM WITH CAD  Comparison: With priors  Findings:  ACR Breast Density Category 2: There is a scattered fibroglandular  pattern.  There are stable lumpectomy changes in the right breast. There is  no new suspicious mass or malignant-type microcalcifications in  either breast.  Mammographic images were processed with CAD.  IMPRESSION:  No evidence of malignancy in either breast.  RECOMMENDATION:  Bilateral diagnostic mammogram in 1 year is recommended.  I have discussed the findings and recommendations with the patient.  Results were also provided in writing at the conclusion of the  visit.   Impression: Doing well, with no evidence of recurrent cancer or new cancer  Plan:  She has graduated at this point and will follow up as needed.  Continue oncology follow up.

## 2014-01-06 ENCOUNTER — Telehealth: Payer: Self-pay | Admitting: Cardiology

## 2014-01-06 NOTE — Telephone Encounter (Signed)
Started amlodipine about a month ago and swelling started about 10 days ago. Denies any shortness of breath. When elevates feet swelling goes down. Blood pressure doing much better. Will forward to  Dr. Mare Ferrari for review

## 2014-01-06 NOTE — Telephone Encounter (Signed)
New message     C/o feet swelling every day.  They go down a lot while sleeping. Pt is on bp medicatin.  Could this be her medication?

## 2014-01-06 NOTE — Telephone Encounter (Signed)
Let us try reducing amlodipine to just 2.5 mg daily

## 2014-01-06 NOTE — Telephone Encounter (Signed)
Advised patient. She will continue to monitor blood pressure at home and call back if no improvement in swelling or blood pressure goes up

## 2014-01-06 NOTE — Telephone Encounter (Signed)
Left message to call back  

## 2014-01-09 ENCOUNTER — Other Ambulatory Visit: Payer: Self-pay | Admitting: Oncology

## 2014-04-05 ENCOUNTER — Other Ambulatory Visit: Payer: Self-pay | Admitting: Cardiology

## 2014-04-05 DIAGNOSIS — F419 Anxiety disorder, unspecified: Secondary | ICD-10-CM

## 2014-04-06 ENCOUNTER — Other Ambulatory Visit: Payer: Self-pay | Admitting: Cardiology

## 2014-04-06 DIAGNOSIS — F419 Anxiety disorder, unspecified: Secondary | ICD-10-CM

## 2014-04-09 ENCOUNTER — Telehealth: Payer: Self-pay

## 2014-04-09 NOTE — Telephone Encounter (Signed)
Okay to refill ativan

## 2014-04-09 NOTE — Telephone Encounter (Signed)
Called lorazepam refill into pharmacy for patient. Ok per Dr Mare Ferrari.

## 2014-06-03 ENCOUNTER — Ambulatory Visit (INDEPENDENT_AMBULATORY_CARE_PROVIDER_SITE_OTHER): Payer: Medicare Other | Admitting: Cardiology

## 2014-06-03 ENCOUNTER — Other Ambulatory Visit: Payer: Medicare Other

## 2014-06-03 ENCOUNTER — Encounter: Payer: Self-pay | Admitting: Cardiology

## 2014-06-03 VITALS — BP 141/52 | HR 75 | Ht 65.0 in | Wt 157.0 lb

## 2014-06-03 DIAGNOSIS — R002 Palpitations: Secondary | ICD-10-CM

## 2014-06-03 DIAGNOSIS — I119 Hypertensive heart disease without heart failure: Secondary | ICD-10-CM

## 2014-06-03 DIAGNOSIS — E7800 Pure hypercholesterolemia, unspecified: Secondary | ICD-10-CM

## 2014-06-03 LAB — BASIC METABOLIC PANEL
BUN: 11 mg/dL (ref 6–23)
CO2: 30 mEq/L (ref 19–32)
CREATININE: 0.9 mg/dL (ref 0.4–1.2)
Calcium: 9.1 mg/dL (ref 8.4–10.5)
Chloride: 101 mEq/L (ref 96–112)
GFR: 64.87 mL/min (ref >60.00)
Glucose, Bld: 79 mg/dL (ref 70–99)
Potassium: 4 mEq/L (ref 3.5–5.1)
Sodium: 136 mEq/L (ref 135–145)

## 2014-06-03 LAB — HEPATIC FUNCTION PANEL
ALK PHOS: 38 U/L — AB (ref 39–117)
ALT: 15 U/L (ref 0–35)
AST: 17 U/L (ref 0–37)
Albumin: 3.8 g/dL (ref 3.5–5.2)
BILIRUBIN DIRECT: 0.1 mg/dL (ref 0.0–0.3)
Total Bilirubin: 0.5 mg/dL (ref 0.2–1.2)
Total Protein: 6.6 g/dL (ref 6.0–8.3)

## 2014-06-03 LAB — LIPID PANEL
CHOLESTEROL: 157 mg/dL (ref 0–200)
HDL: 40.1 mg/dL (ref >39.00)
LDL Cholesterol: 85 mg/dL (ref 0–99)
NonHDL: 116.9
Total CHOL/HDL Ratio: 4
Triglycerides: 158 mg/dL — ABNORMAL HIGH (ref 0.0–149.0)
VLDL: 31.6 mg/dL (ref 0.0–40.0)

## 2014-06-03 MED ORDER — RAMIPRIL 5 MG PO CAPS: 5.0000 mg | ORAL_CAPSULE | Freq: Every day | ORAL | Status: DC

## 2014-06-03 NOTE — Assessment & Plan Note (Signed)
She has not had any sustained palpitations since last visit

## 2014-06-03 NOTE — Assessment & Plan Note (Signed)
The patient has not been having any chest pain.  She has occasional palpitations.  She has developed some intermittent peripheral edema probably secondary to amlodipine.

## 2014-06-03 NOTE — Patient Instructions (Signed)
STOP AMLODIPINE  START RAMIPRIL 5 MG DAILY  Will obtain labs today and call you with the results (LP/BMET/HFP)  Your physician wants you to follow-up in: 6 months with fasting labs (lp/bmet/hfp) You will receive a reminder letter in the mail two months in advance. If you don't receive a letter, please call our office to schedule the follow-up appointment.

## 2014-06-03 NOTE — Progress Notes (Signed)
Lisa Cox Date of Birth:  [birth year] [facility] [address] [place], [place]  [address] [phone]        Fax   [phone]   History of Present Illness: This pleasant 68 year old woman is seen for a scheduled followup office visit. She has a past history of palpitations and a history of hypercholesterolemia. Since last visit she's been doing well no new cardiac complaints. She is retired now and she stays busy taking after several grandchildren on a regular basis. She has a past history of breast cancer discovered several years ago. She is on tamoxifen which she would be on for another several years. In December 2013 she had an episode of rectal bleeding followed by syncope. She went to the emergency room and was checked for syncope and discharged. She has had no further syncopal episodes. She has noted occasional blood in the stool. She had a colonoscopy 2 years ago. Her symptoms seem to be worse after she eats nuts. She may have some diverticulosis.  She has had a history of fluctuating blood pressures.  She has been on amlodipine.  She has had peripheral edema.   Current Outpatient Prescriptions  Medication Sig Dispense Refill  . acetaminophen (TYLENOL) 500 MG tablet Take 1,000 mg by mouth every 6 (six) hours as needed. For cramping.      Marland Kitchen aspirin 81 MG tablet Take 81 mg by mouth once. Does not take on a daily basis      . atenolol (TENORMIN) 25 MG tablet TAKE 1 TABLET (25 MG TOTAL) BY MOUTH DAILY.  180 tablet  2  . b complex vitamins tablet Take 1 tablet by mouth daily. Takes sometimes      . carboxymethylcellulose (REFRESH TEARS) 0.5 % SOLN Apply 1 drop to eye 3 (three) times daily as needed.      . dimenhyDRINATE (DRAMAMINE) 50 MG tablet Take 50 mg by mouth at bedtime.      Marland Kitchen LORazepam (ATIVAN) 1 MG tablet TAKE 1 TABLET TWICE DAILY AS NEEDED  60 tablet  3  . Omega-3 Fatty Acids (FISH OIL CONCENTRATE PO) Take 1 capsule by mouth daily.       Marland Kitchen  omeprazole (PRILOSEC) 40 MG capsule Take 40 mg by mouth every morning.        . ramipril (ALTACE) 5 MG capsule Take 1 capsule (5 mg total) by mouth daily.  30 capsule  5  . tamoxifen (NOLVADEX) 20 MG tablet TAKE 1 TABLET BY MOUTH DAILY  30 tablet  11   No current facility-administered medications for this visit.    Allergies  Allergen Reactions  . Amlodipine     Peripheral edema  . Crestor [Rosuvastatin Calcium]     Leg cramps  . Erythromycin     nausea  . Niacin And Related Itching  . Restoril     Reaction-"nervous"  . Zolpidem Tartrate     Reaction-"nervous"    Patient Active Problem List   Diagnosis Date Noted  . Hematochezia 05/20/2013  . History of breast cancer 09/29/2011  . Hypercholesterolemia 07/11/2011  . Benign hypertensive heart disease without heart failure 07/11/2011  . Palpitations     History  Smoking status  . Never Smoker   Smokeless tobacco  . Not on file    History  Alcohol Use No    Family History  Problem Relation Age of Onset  . Cancer Father     pancreatic  . Heart disease Father   .  Hypertension Sister   . Hypertension Brother     Review of Systems: Constitutional: no fever chills diaphoresis or fatigue or change in weight.  Head and neck: no hearing loss, no epistaxis, no photophobia or visual disturbance. Respiratory: No cough, shortness of breath or wheezing. Cardiovascular: No chest pain peripheral edema, palpitations. Gastrointestinal: No abdominal distention, no abdominal pain, no change in bowel habits hematochezia or melena. Genitourinary: No dysuria, no frequency, no urgency, no nocturia. Musculoskeletal:No arthralgias, no back pain, no gait disturbance or myalgias. Neurological: No dizziness, no headaches, no numbness, no seizures, no syncope, no weakness, no tremors. Hematologic: No lymphadenopathy, no easy bruising. Psychiatric: No confusion, no hallucinations, no sleep disturbance.    Physical Exam: Filed Vitals:     06/03/14 0916  BP: 141/52  Pulse: 75   general appearance reveals a well-developed well-nourished woman in no distress.The head and neck exam reveals pupils equal and reactive.  Extraocular movements are full.  There is no scleral icterus.  The mouth and pharynx are normal.  The neck is supple.  The carotids reveal no bruits.  The jugular venous pressure is normal.  The  thyroid is not enlarged.  There is no lymphadenopathy.  The chest is clear to percussion and auscultation.  There are no rales or rhonchi.  Expansion of the chest is symmetrical.  The precordium is quiet.  The first heart sound is normal.  The second heart sound is physiologically split.  There is no murmur gallop rub or click.  There is no abnormal lift or heave.  The abdomen is soft and nontender.  The bowel sounds are normal.  The liver and spleen are not enlarged.  There are no abdominal masses.  There are no abdominal bruits.  Extremities reveal good pedal pulses.  There is no phlebitis or edema.  There is no cyanosis or clubbing.  Strength is normal and symmetrical in all extremities.  There is no lateralizing weakness.  There are no sensory deficits.  The skin is warm and dry.  There is no rash.     Assessment / Plan: 1. essential hypertension 2. Hypercholesterolemia 3. past history of breast cancer of right breast treated with lumpectomy and radiation and currently on tamoxifen  Disposition: We will stop amlodipine because of a peripheral edema.  In its place she will start ramipril 5 mg daily.  Her most recent blood work showed normal renal function and low normal potassium.  We are checking labs today. Return in 6 months for followup office visit EKG lipid panel hepatic function panel and basal metabolic panel

## 2014-06-03 NOTE — Assessment & Plan Note (Signed)
The patient has a history of hypercholesterolemia.  She is on omega-3 fatty acids.  She is not on any statins.  She was intolerant of Crestor which caused leg cramps.  We are checking labs today

## 2014-06-04 NOTE — Progress Notes (Signed)
Quick Note:  Please report to patient. The recent labs are stable. Continue same medication and careful diet. ______ 

## 2014-07-17 ENCOUNTER — Telehealth: Payer: Self-pay | Admitting: Hematology and Oncology

## 2014-07-17 NOTE — Telephone Encounter (Signed)
lmonvm for pt re new appt for lb/vg 12/22. schedule mailed.

## 2014-08-25 ENCOUNTER — Ambulatory Visit (INDEPENDENT_AMBULATORY_CARE_PROVIDER_SITE_OTHER): Payer: Medicare Other | Admitting: Family Medicine

## 2014-08-25 ENCOUNTER — Other Ambulatory Visit: Payer: Self-pay | Admitting: Cardiology

## 2014-08-25 VITALS — BP 128/82 | HR 77 | Temp 98.5°F | Resp 16 | Ht 66.25 in | Wt 161.4 lb

## 2014-08-25 DIAGNOSIS — R300 Dysuria: Secondary | ICD-10-CM

## 2014-08-25 DIAGNOSIS — F419 Anxiety disorder, unspecified: Secondary | ICD-10-CM

## 2014-08-25 DIAGNOSIS — N3000 Acute cystitis without hematuria: Secondary | ICD-10-CM

## 2014-08-25 LAB — POCT UA - MICROSCOPIC ONLY
Crystals, Ur, HPF, POC: NEGATIVE
Epithelial cells, urine per micros: NEGATIVE
Mucus, UA: NEGATIVE
Yeast, UA: NEGATIVE

## 2014-08-25 LAB — POCT URINALYSIS DIPSTICK
Bilirubin, UA: NEGATIVE
GLUCOSE UA: NEGATIVE
Ketones, UA: NEGATIVE
Nitrite, UA: POSITIVE
Protein, UA: 30
SPEC GRAV UA: 1.01
UROBILINOGEN UA: 0.2
pH, UA: 6

## 2014-08-25 MED ORDER — LORAZEPAM 1 MG PO TABS: 1.0000 mg | ORAL_TABLET | Freq: Two times a day (BID) | ORAL | Status: DC | PRN

## 2014-08-25 MED ORDER — ATENOLOL 25 MG PO TABS: ORAL_TABLET | ORAL | Status: DC

## 2014-08-25 MED ORDER — CEPHALEXIN 500 MG PO CAPS: 500.0000 mg | ORAL_CAPSULE | Freq: Two times a day (BID) | ORAL | Status: DC

## 2014-08-25 NOTE — Telephone Encounter (Signed)
Follow up      Retuning Melinda's call

## 2014-08-25 NOTE — Telephone Encounter (Signed)
Left message to call back  

## 2014-08-25 NOTE — Telephone Encounter (Signed)
Spoke with patient and she is taking an extra pill several times a week. Requested Rx be sent over for twice a day as needed since she is unable to give specific amount to make sure she does not run out early  Discussed with  Dr. Mare Ferrari and ok to change

## 2014-08-25 NOTE — Progress Notes (Addendum)
[facility] [address], [place] [address] [phone]  Date:  08/25/2014   Name:  Lisa Cox   DOB:  23-Feb-1946   MRN:  [id number]  PCP:  Darlin Coco, MD    Chief Complaint: Urinary Tract Infection   History of Present Illness:  Lisa Cox is a 68 y.o. very pleasant female patient who presents with the following:  She has noted urinary burning and "spasms" for the last 3- 4 days.  She has not seen any blood in her urine.   She has noted chills but no fever- did not check her temp at home.   She has noted mild nausea but no vomiting or abd pain, no back pain.    She is generally in good health  She has noted more frequennt UTI over the last seveal years   Patient Active Problem List   Diagnosis Date Noted  . Hematochezia 05/20/2013  . History of breast cancer 09/29/2011  . Hypercholesterolemia 07/11/2011  . Benign hypertensive heart disease without heart failure 07/11/2011  . Palpitations     Past Medical History  Diagnosis Date  . Hypertension   . Bell's palsy 2009  . Hyperlipidemia   . Palpitations     hx of  . Cancer Feb.2011    breast,lumpectomy right side followed by radiation  . GERD (gastroesophageal reflux disease)   . Depression   . Anxiety     Past Surgical History  Procedure Laterality Date  . Tubal ligation  1978  . Breast lumpectomy  Feb. 2011    right breast,followed by radiation therapy  . Eye surgery  1953    skin removal in eye    History  Substance Use Topics  . Smoking status: Never Smoker   . Smokeless tobacco: Not on file  . Alcohol Use: No    Family History  Problem Relation Age of Onset  . Cancer Father     pancreatic  . Heart disease Father   . Hypertension Sister   . Hypertension Brother     Allergies  Allergen Reactions  . Amlodipine     Peripheral edema  . Crestor [Rosuvastatin Calcium]     Leg cramps  . Erythromycin     nausea  . Macrobid [Nitrofurantoin Monohyd Macro]  Nausea And Vomiting  . Niacin And Related Itching  . Restoril     Reaction-"nervous"  . Zolpidem Tartrate     Reaction-"nervous"    Medication list has been reviewed and updated.  Current Outpatient Prescriptions on File Prior to Visit  Medication Sig Dispense Refill  . acetaminophen (TYLENOL) 500 MG tablet Take 1,000 mg by mouth every 6 (six) hours as needed. For cramping.    Marland Kitchen aspirin 81 MG tablet Take 81 mg by mouth once. Does not take on a daily basis    . atenolol (TENORMIN) 25 MG tablet TAKE 1 TABLET (25 MG TOTAL) BY MOUTH DAILY. 180 tablet 2  . b complex vitamins tablet Take 1 tablet by mouth daily. Takes sometimes    . carboxymethylcellulose (REFRESH TEARS) 0.5 % SOLN Apply 1 drop to eye 3 (three) times daily as needed.    . dimenhyDRINATE (DRAMAMINE) 50 MG tablet Take 50 mg by mouth at bedtime.    Marland Kitchen LORazepam (ATIVAN) 1 MG tablet TAKE 1 TABLET TWICE DAILY AS NEEDED 60 tablet 3  . Omega-3 Fatty Acids (FISH OIL CONCENTRATE PO) Take 1 capsule by mouth daily.     Marland Kitchen omeprazole (  PRILOSEC) 40 MG capsule Take 40 mg by mouth every morning.      . ramipril (ALTACE) 5 MG capsule Take 1 capsule (5 mg total) by mouth daily. 30 capsule 5  . tamoxifen (NOLVADEX) 20 MG tablet TAKE 1 TABLET BY MOUTH DAILY 30 tablet 11   No current facility-administered medications on file prior to visit.    Review of Systems:  As per HPI- otherwise negative.   Physical Examination: Filed Vitals:   08/25/14 1219  BP: 128/82  Pulse: 77  Temp: 98.5 F (36.9 C)  Resp: 16   Filed Vitals:   08/25/14 1219  Height: 5' 6.25" (1.683 m)  Weight: 161 lb 6.4 oz (73.211 kg)   Body mass index is 25.85 kg/(m^2). Ideal Body Weight: Weight in (lb) to have BMI = 25: 155.7  GEN: WDWN, NAD, Non-toxic, A & O x 3, looks well HEENT: Atraumatic, Normocephalic. Neck supple. No masses, No LAD. Ears and Nose: No external deformity. CV: RRR, No M/G/R. No JVD. No thrill. No extra heart sounds. PULM: CTA B, no wheezes,  crackles, rhonchi. No retractions. No resp. distress. No accessory muscle use. No CVA tendererness ABD: S, NT, ND, +BS. No rebound. No HSM. EXTR: No c/c/e NEURO Normal gait.  PSYCH: Normally interactive. Conversant. Not depressed or anxious appearing.  Calm demeanor.   Results for orders placed or performed in visit on 08/25/14  POCT UA - Microscopic Only  Result Value Ref Range   WBC, Ur, HPF, POC tntc    RBC, urine, microscopic tntc    Bacteria, U Microscopic trace    Mucus, UA neg    Epithelial cells, urine per micros neg    Crystals, Ur, HPF, POC neg    Casts, Ur, LPF, POC renal tubular    Yeast, UA neg   POCT urinalysis dipstick  Result Value Ref Range   Color, UA Orange    Clarity, UA Cloudy    Glucose, UA Neg    Bilirubin, UA Neg    Ketones, UA Neg    Spec Grav, UA 1.010    Blood, UA Large    pH, UA 6.0    Protein, UA 30    Urobilinogen, UA 0.2    Nitrite, UA Pos    Leukocytes, UA moderate (2+)     Assessment and Plan: Treat for UTI with keflex for 7 days- await culture  Signed Lamar Blinks, MD  Received her negative culture and called 11/7: she is feeling somewhat better but not well.  Asked her to come in for a repeat UA/ micro in the next few days. She will do so

## 2014-08-25 NOTE — Telephone Encounter (Signed)
Follow up      Calling to give cell phone number----please call when you get a moment.

## 2014-08-25 NOTE — Telephone Encounter (Signed)
New message     Need new presc for atenolol ---the presc says take daily but sometimes the pt takes it twice a day and this makes her run out sooner.  [facility] is her pharmacy

## 2014-08-25 NOTE — Patient Instructions (Signed)
Use the keflex twice a day for one week for UTI.  I will be in touch with your culture results.  Let me know if you do not feel better in a couple of days!

## 2014-08-26 LAB — URINE CULTURE
Colony Count: NO GROWTH
Organism ID, Bacteria: NO GROWTH

## 2014-08-27 ENCOUNTER — Telehealth: Payer: Self-pay

## 2014-08-27 NOTE — Telephone Encounter (Signed)
Pt was seen on 08/25/14 and wanted to let Dr. Lorelei Pont know that her symptoms are persisting. She also thinks that the Keflex is affecting her. Also, she would like to know her lab results please!

## 2014-08-28 NOTE — Addendum Note (Signed)
Addended by: Lamar Blinks C on: 08/28/2014 07:32 PM   Modules accepted: Orders

## 2014-09-01 ENCOUNTER — Other Ambulatory Visit (INDEPENDENT_AMBULATORY_CARE_PROVIDER_SITE_OTHER): Payer: Medicare Other | Admitting: *Deleted

## 2014-09-01 DIAGNOSIS — R300 Dysuria: Secondary | ICD-10-CM

## 2014-09-01 LAB — POCT UA - MICROSCOPIC ONLY
CASTS, UR, LPF, POC: NEGATIVE
Crystals, Ur, HPF, POC: NEGATIVE
MUCUS UA: NEGATIVE
Yeast, UA: NEGATIVE

## 2014-09-01 LAB — POCT URINALYSIS DIPSTICK
Bilirubin, UA: NEGATIVE
Glucose, UA: NEGATIVE
Ketones, UA: NEGATIVE
Leukocytes, UA: NEGATIVE
Nitrite, UA: NEGATIVE
Protein, UA: NEGATIVE
SPEC GRAV UA: 1.02
UROBILINOGEN UA: 0.2
pH, UA: 5.5

## 2014-09-02 LAB — URINE CULTURE
Colony Count: NO GROWTH
Organism ID, Bacteria: NO GROWTH

## 2014-09-08 NOTE — Progress Notes (Signed)
Spoke to her about her repeat urine tests.  She again had a negative culture.  Her urine looked better, but she still does have microscopic hematuria.  It sounds like she had a work-up for this at some point several years ago.  She had cystoscopy 6-7 years ago per her recollection that was benign.  She is a current pt of Dr. Tresa Moore and will plan to see him in the next few months for follow-up.  I will send him a note

## 2014-09-13 ENCOUNTER — Other Ambulatory Visit: Payer: Medicare Other

## 2014-09-13 ENCOUNTER — Ambulatory Visit: Payer: Medicare Other | Admitting: Oncology

## 2014-09-22 ENCOUNTER — Telehealth: Payer: Self-pay | Admitting: *Deleted

## 2014-09-22 NOTE — Telephone Encounter (Signed)
New Message  Left vm for pt concerning flu shot 

## 2014-10-12 ENCOUNTER — Telehealth: Payer: Self-pay | Admitting: Hematology and Oncology

## 2014-10-12 ENCOUNTER — Ambulatory Visit (HOSPITAL_BASED_OUTPATIENT_CLINIC_OR_DEPARTMENT_OTHER): Payer: Medicare Other | Admitting: Lab

## 2014-10-12 ENCOUNTER — Ambulatory Visit (HOSPITAL_BASED_OUTPATIENT_CLINIC_OR_DEPARTMENT_OTHER): Payer: Medicare Other | Admitting: Hematology and Oncology

## 2014-10-12 ENCOUNTER — Other Ambulatory Visit: Payer: Self-pay | Admitting: *Deleted

## 2014-10-12 VITALS — BP 156/66 | HR 75 | Temp 98.1°F | Resp 18 | Ht 66.25 in | Wt 158.5 lb

## 2014-10-12 DIAGNOSIS — D0591 Unspecified type of carcinoma in situ of right breast: Secondary | ICD-10-CM

## 2014-10-12 DIAGNOSIS — C50911 Malignant neoplasm of unspecified site of right female breast: Secondary | ICD-10-CM

## 2014-10-12 LAB — COMPREHENSIVE METABOLIC PANEL (CC13)
ALT: 16 U/L (ref 0–55)
ANION GAP: 9 meq/L (ref 3–11)
AST: 18 U/L (ref 5–34)
Albumin: 3.8 g/dL (ref 3.5–5.0)
Alkaline Phosphatase: 42 U/L (ref 40–150)
BILIRUBIN TOTAL: 0.27 mg/dL (ref 0.20–1.20)
BUN: 12.5 mg/dL (ref 7.0–26.0)
CO2: 28 mEq/L (ref 22–29)
Calcium: 8.9 mg/dL (ref 8.4–10.4)
Chloride: 101 mEq/L (ref 98–109)
Creatinine: 0.9 mg/dL (ref 0.6–1.1)
EGFR: 68 mL/min/{1.73_m2} — AB (ref >90)
GLUCOSE: 97 mg/dL (ref 70–140)
Potassium: 4.1 mEq/L (ref 3.5–5.1)
SODIUM: 137 meq/L (ref 136–145)
Total Protein: 6.4 g/dL (ref 6.4–8.3)

## 2014-10-12 LAB — CBC WITH DIFFERENTIAL/PLATELET
BASO%: 0.7 % (ref 0.0–2.0)
Basophils Absolute: 0.1 10*3/uL (ref 0.0–0.1)
EOS%: 2.6 % (ref 0.0–7.0)
Eosinophils Absolute: 0.2 10*3/uL (ref 0.0–0.5)
HCT: 38 % (ref 34.8–46.6)
HGB: 12.4 g/dL (ref 11.6–15.9)
LYMPH#: 4.1 10*3/uL — AB (ref 0.9–3.3)
LYMPH%: 46.1 % (ref 14.0–49.7)
MCH: 30.3 pg (ref 25.1–34.0)
MCHC: 32.7 g/dL (ref 31.5–36.0)
MCV: 92.5 fL (ref 79.5–101.0)
MONO#: 0.7 10*3/uL (ref 0.1–0.9)
MONO%: 7.9 % (ref 0.0–14.0)
NEUT#: 3.8 10*3/uL (ref 1.5–6.5)
NEUT%: 42.7 % (ref 38.4–76.8)
PLATELETS: 206 10*3/uL (ref 145–400)
RBC: 4.1 10*6/uL (ref 3.70–5.45)
RDW: 12.9 % (ref 11.2–14.5)
WBC: 8.8 10*3/uL (ref 3.9–10.3)

## 2014-10-12 NOTE — Telephone Encounter (Signed)
per pof to sch pt appt-per pt rreq to mail appt sch -mailed

## 2014-10-12 NOTE — Progress Notes (Signed)
Patient Care Team: Darlin Coco, MD as PCP - General (Cardiology)  DIAGNOSIS: No matching staging information was found for the patient.  SUMMARY OF ONCOLOGIC HISTORY:   Breast cancer, right breast   11/16/2009 Initial Diagnosis DCIS high-grade with associated microcalcification and necrosis 0.6 cm ER 2%, PR 0%   12/15/2009 Surgery Right breast lumpectomy no residual DCIS   01/25/2010 - 02/20/2010 Radiation Therapy Adjuvant radiation therapy   03/12/2010 -  Anti-estrogen oral therapy Tamoxifen 20 mg daily    CHIEF COMPLIANT: Annual follow-up of breast cancer  INTERVAL HISTORY: Lisa Cox is a 68 year old lady with above-mentioned history of DCIS involving the right breast treated with lumpectomy followed by adjuvant radiation therapy and has been on oral antiestrogen therapy with tamoxifen since May 2011. She will finish 5 years of therapy by May 2016. She reports no major problems tolerating tamoxifen. She had regular mammograms in February of every year. She denies any lumps or nodules in the breasts.  REVIEW OF SYSTEMS:   Constitutional: Denies fevers, chills or abnormal weight loss Eyes: Denies blurriness of vision Ears, nose, mouth, throat, and face: Denies mucositis or sore throat Respiratory: Denies cough, dyspnea or wheezes Cardiovascular: Denies palpitation, chest discomfort or lower extremity swelling Gastrointestinal:  Denies nausea, heartburn or change in bowel habits Skin: Denies abnormal skin rashes Lymphatics: Denies new lymphadenopathy or easy bruising Neurological:Denies numbness, tingling or new weaknesses Behavioral/Psych: Mood is stable, no new changes  Breast:  denies any pain or lumps or nodules in either breasts All other systems were reviewed with the patient and are negative.  I have reviewed the past medical history, past surgical history, social history and family history with the patient and they are unchanged from previous note.  ALLERGIES:  is  allergic to amlodipine; crestor; erythromycin; macrobid; niacin and related; restoril; and zolpidem tartrate.  MEDICATIONS:  Current Outpatient Prescriptions  Medication Sig Dispense Refill  . acetaminophen (TYLENOL) 500 MG tablet Take 1,000 mg by mouth every 6 (six) hours as needed. For cramping.    Marland Kitchen aspirin 81 MG tablet Take 81 mg by mouth once. Does not take on a daily basis    . atenolol (TENORMIN) 25 MG tablet 1 tablet twice a day or as directed 180 tablet 3  . b complex vitamins tablet Take 1 tablet by mouth daily. Takes sometimes    . carboxymethylcellulose (REFRESH TEARS) 0.5 % SOLN Apply 1 drop to eye 3 (three) times daily as needed.    . dimenhyDRINATE (DRAMAMINE) 50 MG tablet Take 50 mg by mouth at bedtime.    Marland Kitchen LORazepam (ATIVAN) 1 MG tablet Take 1 tablet (1 mg total) by mouth 2 (two) times daily as needed for anxiety. 60 tablet 5  . Omega-3 Fatty Acids (FISH OIL CONCENTRATE PO) Take 1 capsule by mouth daily.     Marland Kitchen omeprazole (PRILOSEC) 20 MG capsule Take 20 mg by mouth daily.    . ramipril (ALTACE) 5 MG capsule Take 1 capsule (5 mg total) by mouth daily. 30 capsule 5  . tamoxifen (NOLVADEX) 20 MG tablet TAKE 1 TABLET BY MOUTH DAILY 30 tablet 11   No current facility-administered medications for this visit.    PHYSICAL EXAMINATION: ECOG PERFORMANCE STATUS: 0 - Asymptomatic  Filed Vitals:   10/12/14 1510  BP: 156/66  Pulse: 75  Temp: 98.1 F (36.7 C)  Resp: 18   Filed Weights   10/12/14 1510  Weight: 158 lb 8 oz (71.895 kg)    GENERAL:alert, no distress and comfortable  SKIN: skin color, texture, turgor are normal, no rashes or significant lesions EYES: normal, Conjunctiva are pink and non-injected, sclera clear OROPHARYNX:no exudate, no erythema and lips, buccal mucosa, and tongue normal  NECK: supple, thyroid normal size, non-tender, without nodularity LYMPH:  no palpable lymphadenopathy in the cervical, axillary or inguinal LUNGS: clear to auscultation and  percussion with normal breathing effort HEART: regular rate & rhythm and no murmurs and no lower extremity edema ABDOMEN:abdomen soft, non-tender and normal bowel sounds Musculoskeletal:no cyanosis of digits and no clubbing  NEURO: alert & oriented x 3 with fluent speech, no focal motor/sensory deficits BREAST: No palpable masses or nodules in either right or left breasts. No palpable axillary supraclavicular or infraclavicular adenopathy no breast tenderness or nipple discharge.   LABORATORY DATA:  I have reviewed the data as listed   Chemistry      Component Value Date/Time   NA 136 06/03/2014 0952   NA 139 09/11/2013 1038   K 4.0 06/03/2014 0952   K 4.1 09/11/2013 1038   CL 101 06/03/2014 0952   CL 103 01/16/2013 1020   CO2 30 06/03/2014 0952   CO2 29 09/11/2013 1038   BUN 11 06/03/2014 0952   BUN 11.1 09/11/2013 1038   CREATININE 0.9 06/03/2014 0952   CREATININE 0.8 09/11/2013 1038      Component Value Date/Time   CALCIUM 9.1 06/03/2014 0952   CALCIUM 9.3 09/11/2013 1038   ALKPHOS 38* 06/03/2014 0952   ALKPHOS 43 09/11/2013 1038   AST 17 06/03/2014 0952   AST 18 09/11/2013 1038   ALT 15 06/03/2014 0952   ALT 13 09/11/2013 1038   BILITOT 0.5 06/03/2014 0952   BILITOT 0.32 09/11/2013 1038       Lab Results  Component Value Date   WBC 8.8 10/12/2014   HGB 12.4 10/12/2014   HCT 38.0 10/12/2014   MCV 92.5 10/12/2014   PLT 206 10/12/2014   NEUTROABS 3.8 10/12/2014     RADIOGRAPHIC STUDIES: I have personally reviewed the radiology reports and agreed with their findings. Mammogram 12/14/2013 normal  ASSESSMENT & PLAN:  Breast cancer, right breast Right breast DCIS diagnosed February 2011 treated with lumpectomy and radiation and is currently on tamoxifen since May 2011 ER 2% PR 0%  Tamoxifen toxicities: No major side effects of tamoxifen tolerating it fairly well. Patient to complete tamoxifen therapy by May 2016   Breast cancer surveillance: 1. Breast exam  10/12/2014 normal 2. Mammogram 12/14/2013 normal  Survivorship:Discussed the importance of physical exercise in decreasing the likelihood of breast cancer recurrence. Recommended 30 mins daily 6 days a week of either brisk walking or cycling or swimming. Encouraged patient to eat more fruits and vegetables and decrease red meat.   Return to clinic once a year for follow-up and breast exams.      Orders Placed This Encounter  Procedures  . CBC with Differential    Standing Status: Future     Number of Occurrences:      Standing Expiration Date: 10/12/2015  . Comprehensive metabolic panel (Cmet) - [facility]    Standing Status: Future     Number of Occurrences:      Standing Expiration Date: 10/12/2015   The patient has a good understanding of the overall plan. she agrees with it. She will call with any problems that may develop before her next visit here.   Rulon Eisenmenger, MD 10/12/2014 3:25 PM

## 2014-10-12 NOTE — Assessment & Plan Note (Signed)
Right breast DCIS diagnosed February 2011 treated with lumpectomy and radiation and is currently on tamoxifen since May 2011 ER 2% PR 0%  Tamoxifen toxicities: No major side effects of tamoxifen tolerating it fairly well. Patient to complete tamoxifen therapy by May 2016   Breast cancer surveillance: 1. Breast exam 10/12/2014 normal 2. Mammogram 12/14/2013 normal  Survivorship:Discussed the importance of physical exercise in decreasing the likelihood of breast cancer recurrence. Recommended 30 mins daily 6 days a week of either brisk walking or cycling or swimming. Encouraged patient to eat more fruits and vegetables and decrease red meat.   Return to clinic once a year for follow-up and breast exams.

## 2014-11-15 ENCOUNTER — Other Ambulatory Visit: Payer: Self-pay | Admitting: Obstetrics and Gynecology

## 2014-11-15 DIAGNOSIS — Z853 Personal history of malignant neoplasm of breast: Secondary | ICD-10-CM

## 2014-11-30 ENCOUNTER — Encounter: Payer: Self-pay | Admitting: Cardiology

## 2014-11-30 ENCOUNTER — Other Ambulatory Visit (INDEPENDENT_AMBULATORY_CARE_PROVIDER_SITE_OTHER): Payer: Medicare Other | Admitting: *Deleted

## 2014-11-30 ENCOUNTER — Ambulatory Visit (INDEPENDENT_AMBULATORY_CARE_PROVIDER_SITE_OTHER): Payer: Medicare Other | Admitting: Cardiology

## 2014-11-30 VITALS — BP 150/80 | HR 73 | Ht 66.0 in | Wt 155.8 lb

## 2014-11-30 DIAGNOSIS — E7800 Pure hypercholesterolemia, unspecified: Secondary | ICD-10-CM

## 2014-11-30 DIAGNOSIS — I119 Hypertensive heart disease without heart failure: Secondary | ICD-10-CM

## 2014-11-30 DIAGNOSIS — F419 Anxiety disorder, unspecified: Secondary | ICD-10-CM

## 2014-11-30 DIAGNOSIS — R002 Palpitations: Secondary | ICD-10-CM

## 2014-11-30 LAB — HEPATIC FUNCTION PANEL
ALT: 15 U/L (ref 0–35)
AST: 19 U/L (ref 0–37)
Albumin: 3.9 g/dL (ref 3.5–5.2)
Alkaline Phosphatase: 38 U/L — ABNORMAL LOW (ref 39–117)
BILIRUBIN TOTAL: 0.4 mg/dL (ref 0.2–1.2)
Bilirubin, Direct: 0.1 mg/dL (ref 0.0–0.3)
Total Protein: 6.5 g/dL (ref 6.0–8.3)

## 2014-11-30 LAB — LIPID PANEL
Cholesterol: 156 mg/dL (ref 0–200)
HDL: 39.9 mg/dL (ref >39.00)
LDL Cholesterol: 80 mg/dL (ref 0–99)
NonHDL: 116.1
Total CHOL/HDL Ratio: 4
Triglycerides: 179 mg/dL — ABNORMAL HIGH (ref 0.0–149.0)
VLDL: 35.8 mg/dL (ref 0.0–40.0)

## 2014-11-30 MED ORDER — RAMIPRIL 10 MG PO CAPS: 10.0000 mg | ORAL_CAPSULE | Freq: Every day | ORAL | Status: DC

## 2014-11-30 NOTE — Progress Notes (Signed)
Cardiology Office Note   Date:  11/30/2014   ID:  Lisa Cox, DOB 1946-07-12, MRN [id number]  PCP:  Darlin Coco, MD  Cardiologist:   Darlin Coco, MD   No chief complaint on file.     History of Present Illness: Lisa Cox is a 69 y.o. female who presents for scheduled follow-up office visit  This pleasant 69 year old woman is seen for a scheduled followup office visit. She has a past history of palpitations and a history of hypercholesterolemia. Since last visit she's been doing well no new cardiac complaints. She is retired now and she stays busy taking after several grandchildren on a regular basis. She has a past history of breast cancer discovered several years ago. She is on tamoxifen which she would be on for another several years. In December 2013 she had an episode of rectal bleeding followed by syncope. She went to the emergency room and was checked for syncope and discharged. She has had no further syncopal episodes. She has noted occasional blood in the stool. She had a colonoscopy 2 years ago. Her symptoms seem to be worse after she eats nuts. She may have some diverticulosis.  She has had a history of fluctuating blood pressures. She is on ramipril 5 mg daily.  She did not tolerate amlodipine because of edema. She has a history of breast cancer and will achieve a 5 year remission in May which time she will stop tamoxifen. Since we last saw her she was treated for a urinary tract infection at [facility] urgent care.  Past Medical History  Diagnosis Date  . Hypertension   . Bell's palsy 2009  . Hyperlipidemia   . Palpitations     hx of  . Cancer Feb.2011    breast,lumpectomy right side followed by radiation  . GERD (gastroesophageal reflux disease)   . Depression   . Anxiety     Past Surgical History  Procedure Laterality Date  . Tubal ligation  1978  . Breast lumpectomy  Feb. 2011    right breast,followed by radiation therapy  . Eye surgery   1953    skin removal in eye     Current Outpatient Prescriptions  Medication Sig Dispense Refill  . acetaminophen (TYLENOL) 500 MG tablet Take 1,000 mg by mouth every 6 (six) hours as needed. For cramping.    Marland Kitchen aspirin 81 MG tablet Take 81 mg by mouth once. Does not take on a daily basis    . atenolol (TENORMIN) 25 MG tablet 1 tablet twice a day or as directed 180 tablet 3  . b complex vitamins tablet Take 1 tablet by mouth daily. Takes sometimes    . carboxymethylcellulose (REFRESH TEARS) 0.5 % SOLN Place 1 drop into both eyes 3 (three) times daily as needed.     . dimenhyDRINATE (DRAMAMINE) 50 MG tablet Take 50 mg by mouth at bedtime.    Marland Kitchen LORazepam (ATIVAN) 1 MG tablet Take 1 tablet (1 mg total) by mouth 2 (two) times daily as needed for anxiety. 60 tablet 5  . Omega-3 Fatty Acids (FISH OIL CONCENTRATE PO) Take 1 capsule by mouth daily.     Marland Kitchen omeprazole (PRILOSEC) 20 MG capsule Take 20 mg by mouth daily.    . ramipril (ALTACE) 10 MG capsule Take 1 capsule (10 mg total) by mouth daily. 90 capsule 3  . tamoxifen (NOLVADEX) 20 MG tablet TAKE 1 TABLET BY MOUTH DAILY 30 tablet 11   No current facility-administered medications for this  visit.    Allergies:   Amlodipine; Crestor; Erythromycin; Macrobid; Niacin and related; Restoril; and Zolpidem tartrate    Social History:  The patient  reports that she has never smoked. She does not have any smokeless tobacco history on file. She reports that she does not drink alcohol or use illicit drugs.   Family History:  The patient's family history includes Cancer in her father; Heart disease in her father; Hypertension in her brother and sister.    ROS:  Please see the history of present illness.   Otherwise, review of systems are positive for none.   All other systems are reviewed and negative.    PHYSICAL EXAM: VS:  BP 150/80 mmHg  Pulse 73  Ht 5\' 6"  (1.676 m)  Wt 155 lb 12.8 oz (70.67 kg)  BMI 25.16 kg/m2 , BMI Body mass index is 25.16  kg/(m^2). GEN: Well nourished, well developed, in no acute distress HEENT: normal Neck: no JVD, carotid bruits, or masses Cardiac: RRR; no murmurs, rubs, or gallops,no edema  Respiratory:  clear to auscultation bilaterally, normal work of breathing GI: soft, nontender, nondistended, + BS MS: no deformity or atrophy Skin: warm and dry, no rash Neuro:  Strength and sensation are intact Psych: euthymic mood, full affect   EKG:  EKG is ordered today. The ekg ordered today demonstrates normal sinus rhythm and no ischemic changes.   Recent Labs: 10/12/2014: ALT 16; BUN 12.5; Creatinine 0.9; Hemoglobin 12.4; Platelets 206; Potassium 4.1; Sodium 137    Lipid Panel    Component Value Date/Time   CHOL 157 06/03/2014 0952   TRIG 158.0* 06/03/2014 0952   HDL 40.10 06/03/2014 0952   CHOLHDL 4 06/03/2014 0952   VLDL 31.6 06/03/2014 0952   LDLCALC 85 06/03/2014 0952      Wt Readings from Last 3 Encounters:  11/30/14 155 lb 12.8 oz (70.67 kg)  10/12/14 158 lb 8 oz (71.895 kg)  08/25/14 161 lb 6.4 oz (73.211 kg)    ASSESSMENT AND PLAN:  1. essential hypertension 2. Hypercholesterolemia 3. past history of breast cancer of right breast treated with lumpectomy and radiation and currently on tamoxifen until May 2016   Current medicines are reviewed at length with the patient today.  The patient does not have concerns regarding medicines.  The following changes have been made: For better blood pressure control we are increasing ramipril to 10 mg daily  We are drawing a lipid panel today.  She brought her other labs with her .  Orders Placed This Encounter  Procedures  . Lipid panel  . Hepatic function panel  . Basic metabolic panel  . EKG 12-Lead     Disposition:   FU with Dr. Mare Ferrari in 6 months for office visit lipid panel hepatic function panel and basal metabolic panel   Signed, Darlin Coco, MD  11/30/2014 1:33 PM    [facility] [place], [place], [place]  [address] Phone: [phone]; Fax: [phone]

## 2014-11-30 NOTE — Patient Instructions (Addendum)
Will obtain labs today and call you with the results (lipid only)  INCREASE YOUR RAMIPRIL TO 10 MG DAILY   Your physician wants you to follow-up in: 6 month ov You will receive a reminder letter in the mail two months in advance. If you don't receive a letter, please call our office to schedule the follow-up appointment.

## 2014-12-01 NOTE — Progress Notes (Signed)
Quick Note:  Please report to patient. The recent labs are stable. Continue same medication and careful diet. ______ 

## 2014-12-16 ENCOUNTER — Other Ambulatory Visit: Payer: Self-pay | Admitting: Oncology

## 2014-12-16 NOTE — Telephone Encounter (Signed)
Last OV 09/2014.  Next OV 12/2014

## 2014-12-21 ENCOUNTER — Other Ambulatory Visit: Payer: Self-pay | Admitting: Obstetrics and Gynecology

## 2014-12-21 ENCOUNTER — Ambulatory Visit
Admission: RE | Admit: 2014-12-21 | Discharge: 2014-12-21 | Disposition: A | Discharge status: Home / Self Care | Payer: Medicare Other | Source: Ambulatory Visit | Attending: Obstetrics and Gynecology | Admitting: Obstetrics and Gynecology

## 2014-12-21 DIAGNOSIS — Z853 Personal history of malignant neoplasm of breast: Secondary | ICD-10-CM

## 2014-12-22 ENCOUNTER — Ambulatory Visit
Admission: RE | Admit: 2014-12-22 | Discharge: 2014-12-22 | Disposition: A | Discharge status: Home / Self Care | Payer: Medicare Other | Source: Ambulatory Visit | Attending: Obstetrics and Gynecology | Admitting: Obstetrics and Gynecology

## 2014-12-22 ENCOUNTER — Other Ambulatory Visit: Payer: Self-pay | Admitting: Obstetrics and Gynecology

## 2014-12-22 DIAGNOSIS — R921 Mammographic calcification found on diagnostic imaging of breast: Secondary | ICD-10-CM

## 2014-12-22 DIAGNOSIS — Z853 Personal history of malignant neoplasm of breast: Secondary | ICD-10-CM

## 2015-02-02 ENCOUNTER — Telehealth: Payer: Self-pay | Admitting: Cardiology

## 2015-02-02 NOTE — Telephone Encounter (Signed)
New message      Pt says her heart is "skipping" beats and she has nausea.  Please advise

## 2015-02-02 NOTE — Telephone Encounter (Signed)
Agree with the advice given.  Avoid all caffeine.  She could use caffeine free ginger ale for nausea if necessary.  For the palpitations she can increase the atenolol to 3 times a day if necessary

## 2015-02-02 NOTE — Telephone Encounter (Signed)
Calling stating for about 3 weeks she has had nausea and heart skipping.  States the nausea is off and on and doesn't seem to be related to food.  States the heart skipping also has been off and on but today seems to be worse. BP today was 128/60 and HR 67 but was irregular.  When questioned about caffeine she states she does drink "some" caffeine.  States she has been drinking coke to help with the nausea.  Advised should try to cut out all caffeine. Reviewed all of her medications which she states she is taking.  Advised Dr. Mare Ferrari not in office today but will forward message to him and will call her back when he responds.

## 2015-02-02 NOTE — Telephone Encounter (Signed)
Advised that Dr. Mare Ferrari suggested she avoid all caffeine and try caffeine free ginger ale for nausea.  Also suggested that she try increasing Atenolol to 3 times a day if necessary.  She verbalizes understanding and will try to increase the Atenolol. States she has a whole bottle of Atenolol and will use that before getting new Rx. Will call back if continues to have problems.

## 2015-02-23 ENCOUNTER — Telehealth: Payer: Self-pay | Admitting: *Deleted

## 2015-02-23 NOTE — Telephone Encounter (Signed)
VM message from patient regarding her Tamoxifen. She wanted to know if she is supposed to stop taking it at the end of this month, May.  Call back to patient and spoke with her. Upon reviewing last office note of Dr. Lindi Adie, she is to stop the Tamoxifen at the end of this month.  Patient verbalized understanding. No other needs identified at this time.Patient knows she has follow up appt in Dec. 2016

## 2015-02-24 ENCOUNTER — Ambulatory Visit (INDEPENDENT_AMBULATORY_CARE_PROVIDER_SITE_OTHER): Payer: Medicare Other | Admitting: Physician Assistant

## 2015-02-24 VITALS — BP 126/80 | HR 80 | Temp 98.2°F | Wt 156.8 lb

## 2015-02-24 DIAGNOSIS — R300 Dysuria: Secondary | ICD-10-CM

## 2015-02-24 LAB — COMPLETE METABOLIC PANEL WITH GFR
ALBUMIN: 3.9 g/dL (ref 3.5–5.2)
ALT: 11 U/L (ref 0–35)
AST: 16 U/L (ref 0–37)
Alkaline Phosphatase: 36 U/L — ABNORMAL LOW (ref 39–117)
BUN: 10 mg/dL (ref 6–23)
CO2: 26 mEq/L (ref 19–32)
Calcium: 9.1 mg/dL (ref 8.4–10.5)
Chloride: 100 mEq/L (ref 96–112)
Creat: 0.83 mg/dL (ref 0.50–1.10)
GFR, EST NON AFRICAN AMERICAN: 71 mL/min
GFR, Est African American: 82 mL/min
GLUCOSE: 93 mg/dL (ref 70–99)
POTASSIUM: 4.6 meq/L (ref 3.5–5.3)
SODIUM: 137 meq/L (ref 135–145)
TOTAL PROTEIN: 6.6 g/dL (ref 6.0–8.3)
Total Bilirubin: 0.4 mg/dL (ref 0.2–1.2)

## 2015-02-24 LAB — POCT URINALYSIS DIPSTICK
BILIRUBIN UA: NEGATIVE
GLUCOSE UA: NEGATIVE
KETONES UA: NEGATIVE
NITRITE UA: NEGATIVE
Protein, UA: 30
SPEC GRAV UA: 1.015
Urobilinogen, UA: 0.2
pH, UA: 7

## 2015-02-24 LAB — POCT UA - MICROSCOPIC ONLY
Casts, Ur, LPF, POC: NEGATIVE
Crystals, Ur, HPF, POC: NEGATIVE
Mucus, UA: NEGATIVE
Renal tubular cells: POSITIVE
Yeast, UA: NEGATIVE

## 2015-02-24 MED ORDER — CEPHALEXIN 500 MG PO CAPS: 500.0000 mg | ORAL_CAPSULE | Freq: Two times a day (BID) | ORAL | Status: AC

## 2015-02-24 NOTE — Progress Notes (Signed)
[facility] [address] [place] [address] [phone]  Date:  02/24/2015   Name:  Lisa Cox   DOB:  [birth year]   MRN:  [id number]  PCP:  Warren Danes, MD    Chief Complaint: Dysuria   History of Present Illness:  Lisa Cox is a 69 y.o. very pleasant female patient who presents with the following:  2 days ago, she has burning with urination, urinary frequency, bladder spasm.  If she drinks a sufficient amount, the stream will appear normal, but she does have some dribbling.  Appointment with urologist 04/28/2015.  Hematuria for years.  She has fever and chills this morning, but no known fever.  She has some nausea that started in the office now.  She has no back pain.  There is no vaginal discharge, odor, or rash.   She took pyridium, which has helped some.  Patient Active Problem List   Diagnosis Date Noted  . Hematochezia 05/20/2013  . Breast cancer, right breast 09/29/2011  . Hypercholesterolemia 07/11/2011  . Benign hypertensive heart disease without heart failure 07/11/2011  . Palpitations     Past Medical History  Diagnosis Date  . Hypertension   . Bell's palsy 2009  . Hyperlipidemia   . Palpitations     hx of  . Cancer Feb.2011    breast,lumpectomy right side followed by radiation  . GERD (gastroesophageal reflux disease)   . Depression   . Anxiety     Past Surgical History  Procedure Laterality Date  . Tubal ligation  1978  . Breast lumpectomy  Feb. 2011    right breast,followed by radiation therapy  . Eye surgery  1953    skin removal in eye    History  Substance Use Topics  . Smoking status: Never Smoker   . Smokeless tobacco: Not on file  . Alcohol Use: No    Family History  Problem Relation Age of Onset  . Cancer Father     pancreatic  . Heart disease Father   . Hypertension Sister   . Hypertension Brother     Allergies  Allergen Reactions  . Amlodipine     Peripheral edema  . Crestor  [Rosuvastatin Calcium]     Leg cramps  . Erythromycin     nausea  . Macrobid [Nitrofurantoin Monohyd Macro] Nausea And Vomiting  . Niacin And Related Itching  . Restoril     Reaction-"nervous"  . Zolpidem Tartrate     Reaction-"nervous"    Medication list has been reviewed and updated.  Current Outpatient Prescriptions on File Prior to Visit  Medication Sig Dispense Refill  . acetaminophen (TYLENOL) 500 MG tablet Take 1,000 mg by mouth every 6 (six) hours as needed. For cramping.    Marland Kitchen aspirin 81 MG tablet Take 81 mg by mouth once. Does not take on a daily basis    . atenolol (TENORMIN) 25 MG tablet 1 tablet twice a day or as directed 180 tablet 3  . b complex vitamins tablet Take 1 tablet by mouth daily. Takes sometimes    . carboxymethylcellulose (REFRESH TEARS) 0.5 % SOLN Place 1 drop into both eyes 3 (three) times daily as needed.     . dimenhyDRINATE (DRAMAMINE) 50 MG tablet Take 50 mg by mouth at bedtime.    Marland Kitchen LORazepam (ATIVAN) 1 MG tablet Take 1 tablet (1 mg total) by mouth 2 (two) times daily as needed for anxiety. 60 tablet 5  . Omega-3 Fatty  Acids (FISH OIL CONCENTRATE PO) Take 1 capsule by mouth daily.     . ramipril (ALTACE) 10 MG capsule Take 1 capsule (10 mg total) by mouth daily. 90 capsule 3  . tamoxifen (NOLVADEX) 20 MG tablet TAKE 1 TABLET BY MOUTH DAILY 30 tablet 11  . omeprazole (PRILOSEC) 20 MG capsule Take 20 mg by mouth daily.     No current facility-administered medications on file prior to visit.    Review of Systems: ROS otherwise unremarkable unless listed above.    Physical Examination: Filed Vitals:   02/24/15 0951  BP: 126/80  Pulse: 80  Temp: 98.2 F (36.8 C)   Filed Vitals:   02/24/15 0951  Weight: 156 lb 12.8 oz (71.124 kg)   Body mass index is 25.32 kg/(m^2). Ideal Body Weight:   Physical Exam  Constitutional: She is oriented to person, place, and time. She appears well-developed and well-nourished.  HENT:  Head: Normocephalic and  atraumatic.  Eyes: EOM are normal. Pupils are equal, round, and reactive to light.  Cardiovascular: Normal rate, regular rhythm and normal heart sounds.  Exam reveals no friction rub.   No murmur heard. Pulmonary/Chest: Effort normal and breath sounds normal. No respiratory distress. She has no wheezes.  Abdominal: Soft. Bowel sounds are normal. There is no hepatosplenomegaly. There is tenderness (Very mild tenderness at the suprapubic region.  ) in the suprapubic area. There is no tenderness at McBurney's point and negative Murphy's sign.  Neurological: She is alert and oriented to person, place, and time.  Skin: Skin is warm and dry.  Psychiatric: She has a normal mood and affect. Her speech is normal and behavior is normal.    Results for orders placed or performed in visit on 02/24/15  POCT urinalysis dipstick  Result Value Ref Range   Color, UA yellow    Clarity, UA cloudy    Glucose, UA neg    Bilirubin, UA neg    Ketones, UA neg    Spec Grav, UA 1.015    Blood, UA moderate    pH, UA 7.0    Protein, UA 30    Urobilinogen, UA 0.2    Nitrite, UA neg    Leukocytes, UA moderate (2+)   POCT UA - Microscopic Only  Result Value Ref Range   WBC, Ur, HPF, POC TNTC    RBC, urine, microscopic 10-15    Bacteria, U Microscopic trace    Mucus, UA neg    Epithelial cells, urine per micros 0-5    Crystals, Ur, HPF, POC neg    Casts, Ur, LPF, POC neg    Yeast, UA neg    Renal tubular cells pos      Assessment and Plan: 69 year old female is here today for chief complaint of dysuria.   -Culture placed -Placed on keflex bid for 7 days -Due to rbc, and renal tubular cells, will look at kidney function,   Dysuria - Plan: POCT urinalysis dipstick, POCT UA - Microscopic Only, Urine culture, COMPLETE METABOLIC PANEL WITH GFR, cephALEXin (KEFLEX) 500 MG capsule  Ivar Drape, PA-C [facility] 5/9/20168:07 AM

## 2015-02-24 NOTE — Patient Instructions (Signed)

## 2015-02-26 LAB — URINE CULTURE: Colony Count: 30000

## 2015-02-28 ENCOUNTER — Encounter: Payer: Self-pay | Admitting: Physician Assistant

## 2015-03-02 ENCOUNTER — Other Ambulatory Visit: Payer: Self-pay | Admitting: Cardiology

## 2015-03-02 DIAGNOSIS — F419 Anxiety disorder, unspecified: Secondary | ICD-10-CM

## 2015-03-09 ENCOUNTER — Other Ambulatory Visit: Payer: Self-pay | Admitting: Obstetrics and Gynecology

## 2015-03-11 LAB — CYTOLOGY - PAP

## 2015-04-18 ENCOUNTER — Other Ambulatory Visit: Payer: Self-pay

## 2015-06-16 ENCOUNTER — Encounter: Payer: Self-pay | Admitting: Cardiology

## 2015-06-16 ENCOUNTER — Ambulatory Visit (INDEPENDENT_AMBULATORY_CARE_PROVIDER_SITE_OTHER): Payer: Medicare Other | Admitting: Cardiology

## 2015-06-16 VITALS — BP 140/80 | HR 66 | Ht 66.0 in | Wt 150.0 lb

## 2015-06-16 DIAGNOSIS — I119 Hypertensive heart disease without heart failure: Secondary | ICD-10-CM

## 2015-06-16 DIAGNOSIS — R002 Palpitations: Secondary | ICD-10-CM

## 2015-06-16 DIAGNOSIS — E7800 Pure hypercholesterolemia, unspecified: Secondary | ICD-10-CM

## 2015-06-16 DIAGNOSIS — F419 Anxiety disorder, unspecified: Secondary | ICD-10-CM

## 2015-06-16 NOTE — Patient Instructions (Signed)
Medication Instructions:  Your physician recommends that you continue on your current medications as directed. Please refer to the Current Medication list given to you today.  Labwork: none  Testing/Procedures: none  Follow-Up: Your physician wants you to follow-up in: 6 months with fasting labs (lp/bmet/hfp)   You will receive a reminder letter in the mail two months in advance. If you don't receive a letter, please call our office to schedule the follow-up appointment.

## 2015-06-16 NOTE — Progress Notes (Signed)
Cardiology Office Note   Date:  06/16/2015   ID:  ELLIOTTE MARSALIS, DOB 12-10-1945, MRN [id number]  PCP:  Warren Danes, MD  Cardiologist: Darlin Coco MD  Chief Complaint  Patient presents with  . Palpitations      History of Present Illness: Lisa Cox is a 69 y.o. female who presents for scheduled 6 month follow-up office visit   This pleasant 69 year old woman is seen for a scheduled followup office visit. She has a past history of palpitations and a history of hypercholesterolemia. Since last visit she's been doing well no new cardiac complaints. She is retired now and she stays busy taking after several grandchildren on a regular basis.  She has a past history of breast cancer more than 5 years ago.  She has finished up 5 years of tamoxifen.. In December 2013 she had an episode of rectal bleeding followed by syncope. She went to the emergency room and was checked for syncope and discharged. She has had no further syncopal episodes. She has noted occasional blood in the stool. She had a colonoscopy 2 years ago. Her symptoms seem to be worse after she eats nuts. She may have some diverticulosis.  She has had a history of fluctuating blood pressures. She is on ramipril 10 mg daily. She did not tolerate amlodipine because of edema.  Since we last saw her she was treated for a urinary tract infection at [facility] urgent care.  She has a history of microscopic hematuria followed by urologist Dr. Tresa Moore.  She has not been having any chest pain or shortness of breath.  Energy level is good.  Her palpitations are infrequent.  She gets exercise by yard work and mowing her grass and looking after grandchildren.  Past Medical History  Diagnosis Date  . Hypertension   . Bell's palsy 2009  . Hyperlipidemia   . Palpitations     hx of  . Cancer Feb.2011    breast,lumpectomy right side followed by radiation  . GERD (gastroesophageal reflux disease)   . Depression   . Anxiety      Past Surgical History  Procedure Laterality Date  . Tubal ligation  1978  . Breast lumpectomy  Feb. 2011    right breast,followed by radiation therapy  . Eye surgery  1953    skin removal in eye     Current Outpatient Prescriptions  Medication Sig Dispense Refill  . acetaminophen (TYLENOL) 500 MG tablet Take 1,000 mg by mouth every 6 (six) hours as needed. For cramping.    Marland Kitchen aspirin 81 MG tablet Take 81 mg by mouth once. Does not take on a daily basis    . atenolol (TENORMIN) 25 MG tablet 1 tablet twice a day or as directed 180 tablet 3  . b complex vitamins tablet Take 1 tablet by mouth daily. Takes sometimes    . carboxymethylcellulose (REFRESH TEARS) 0.5 % SOLN Place 1 drop into both eyes 3 (three) times daily as needed.     . dimenhyDRINATE (DRAMAMINE) 50 MG tablet Take 50 mg by mouth at bedtime.    Marland Kitchen LORazepam (ATIVAN) 1 MG tablet TAKE 1 TABLET TWICE DAILY AS NEEDED 60 tablet 3  . Omega-3 Fatty Acids (FISH OIL CONCENTRATE PO) Take 1 capsule by mouth daily.     . ramipril (ALTACE) 10 MG capsule Take 1 capsule (10 mg total) by mouth daily. 90 capsule 3  . ranitidine (ZANTAC) 75 MG tablet Take 75 mg by mouth at bedtime.  No current facility-administered medications for this visit.    Allergies:   Amlodipine; Crestor; Erythromycin; Macrobid; Niacin and related; Restoril; and Zolpidem tartrate    Social History:  The patient  reports that she has never smoked. She does not have any smokeless tobacco history on file. She reports that she does not drink alcohol or use illicit drugs.   Family History:  The patient's family history includes Cancer in her father; Heart attack in her father; Heart disease in her father; Hypertension in her brother and sister; Stroke in her maternal aunt.    ROS:  Please see the history of present illness.   Otherwise, review of systems are positive for none.   All other systems are reviewed and negative.    PHYSICAL EXAM: VS:  BP 140/80 mmHg   Pulse 66  Ht 5\' 6"  (1.676 m)  Wt 150 lb (68.04 kg)  BMI 24.22 kg/m2 , BMI Body mass index is 24.22 kg/(m^2). GEN: Well nourished, well developed, in no acute distress HEENT: normal Neck: no JVD, carotid bruits, or masses Cardiac: RRR; no murmurs, rubs, or gallops,no edema  Respiratory:  clear to auscultation bilaterally, normal work of breathing GI: soft, nontender, nondistended, + BS MS: no deformity or atrophy Skin: warm and dry, no rash Neuro:  Strength and sensation are intact Psych: euthymic mood, full affect   EKG:  EKG is ordered today. The ekg ordered today demonstrates normal sinus rhythm.  Within normal limits.   Recent Labs: 10/12/2014: HGB 12.4; Platelets 206 02/24/2015: ALT 11; BUN 10; Creat 0.83; Potassium 4.6; Sodium 137    Lipid Panel    Component Value Date/Time   CHOL 156 11/30/2014 0946   TRIG 179.0* 11/30/2014 0946   HDL 39.90 11/30/2014 0946   CHOLHDL 4 11/30/2014 0946   VLDL 35.8 11/30/2014 0946   LDLCALC 80 11/30/2014 0946      Wt Readings from Last 3 Encounters:  06/16/15 150 lb (68.04 kg)  02/24/15 156 lb 12.8 oz (71.124 kg)  11/30/14 155 lb 12.8 oz (70.67 kg)        ASSESSMENT AND PLAN:  1. essential hypertension 2. Hypercholesterolemia 3. past history of breast cancer of right breast treated with lumpectomy and radiation and she finished tamoxifen in May 2016 4.  History of microscopic hematuria followed by Dr. Tresa Moore  Current medicines are reviewed at length with the patient today.  The patient does not have concerns regarding medicines.  The following changes have been made:  no change  Labs/ tests ordered today include:   Orders Placed This Encounter  Procedures  . Lipid panel  . Hepatic function panel  . Basic metabolic panel  . EKG 12-Lead    Continue current medication.  We did not do any lab today.  Recheck in 6 months for office visit fasting lipid panel hepatic function panel and basal metabolic  panel  Signed, Darlin Coco MD 06/16/2015 10:23 AM    [address], [place], [place]  [address] Phone: [phone]; Fax: [phone]

## 2015-07-13 ENCOUNTER — Ambulatory Visit (INDEPENDENT_AMBULATORY_CARE_PROVIDER_SITE_OTHER): Payer: Medicare Other

## 2015-07-13 ENCOUNTER — Telehealth: Payer: Self-pay | Admitting: Cardiology

## 2015-07-13 ENCOUNTER — Telehealth: Payer: Self-pay | Admitting: Cardiovascular Disease

## 2015-07-13 DIAGNOSIS — R002 Palpitations: Secondary | ICD-10-CM

## 2015-07-13 NOTE — Telephone Encounter (Signed)
Patient c/o Palpitations:  High priority if patient c/o lightheadedness and shortness of breath.  1. How long have you been having palpitations? About 1 week   2. Are you currently experiencing lightheadedness and shortness of breath? [place],   3. Have you checked your BP and heart rate? (document readings) Bp- okay per pt- 9/21 (am) 116/57, p 58.   4. Are you experiencing any other symptoms? Fatigue, nausea, lower back pain

## 2015-07-13 NOTE — Telephone Encounter (Signed)
Pt states has been having palpitations on and off for one week. The palpitations started after she has done extreme work, at that time pt was having back pain and very tired. Now she has the palpitations when she is up and about except when she is resting. Pt states has been taking the extra atenolol 25 mg as DR. Brackbill recommended. Pt states she has taken as many as 3 or 4  atenolol a day. The extra atenolol  has helped. Pt would like to know how many extra atenolol she can take a day to get rid of the palpitations. Spoke Truitt Merle NP. She recommends for pt to taken one or two  Extra atenolol a day, also NP  recommend for pt to have a 48 hours Holter monitor. Pt is aware. Order for Holter has been order in [facility]. PCC's aware of order.

## 2015-07-13 NOTE — Telephone Encounter (Signed)
New Message      Pt calling wanting to know if she can get cardiac enzymes done to find out if she had a heart attack last Thursday. Please call back and advise.

## 2015-07-13 NOTE — Telephone Encounter (Signed)
Spoke with patient who called earlier today and spoke with one of the other triage nurses and is now wearing a 48 hour monitor to evaluate palpitations.  She states last Thursday she had back pain, nausea and fatigue and she wonders if she had a heart attack.  She asks if cardiac enzymes would show that.  She denies pain currently; states the back pain resolved after laying down for a little while but the nausea persisted.  I questioned whether she exerted herself more than usual on that day and she states it is possible although she is accustomed to mowing her own grass.  I advised her that obtaining cardiac enzymes at this time would not be as beneficial as they would be when the symptoms are present.  She states she has not had any symptoms since last Thursday.  I advised her to continue to wear monitor and to call back if symptoms return.  Patient verbalized understanding and agreement.  I am forwarding message to Alvina Filbert, LPN, Dr. Sherryl Barters primary nurse.

## 2015-07-15 ENCOUNTER — Telehealth: Payer: Self-pay | Admitting: Cardiology

## 2015-07-15 ENCOUNTER — Other Ambulatory Visit (INDEPENDENT_AMBULATORY_CARE_PROVIDER_SITE_OTHER): Payer: Medicare Other

## 2015-07-15 ENCOUNTER — Ambulatory Visit (INDEPENDENT_AMBULATORY_CARE_PROVIDER_SITE_OTHER): Payer: Medicare Other | Admitting: *Deleted

## 2015-07-15 VITALS — BP 124/64 | HR 72 | Ht 66.0 in

## 2015-07-15 DIAGNOSIS — R002 Palpitations: Secondary | ICD-10-CM | POA: Diagnosis not present

## 2015-07-15 DIAGNOSIS — E7800 Pure hypercholesterolemia, unspecified: Secondary | ICD-10-CM

## 2015-07-15 DIAGNOSIS — I493 Ventricular premature depolarization: Secondary | ICD-10-CM | POA: Diagnosis not present

## 2015-07-15 DIAGNOSIS — I119 Hypertensive heart disease without heart failure: Secondary | ICD-10-CM

## 2015-07-15 LAB — CBC WITH DIFFERENTIAL/PLATELET
BASOS ABS: 0 10*3/uL (ref 0.0–0.1)
Basophils Relative: 0.5 % (ref 0.0–3.0)
EOS ABS: 0.3 10*3/uL (ref 0.0–0.7)
Eosinophils Relative: 3.9 % (ref 0.0–5.0)
HEMATOCRIT: 39.4 % (ref 36.0–46.0)
HEMOGLOBIN: 13.3 g/dL (ref 12.0–15.0)
LYMPHS PCT: 46.2 % — AB (ref 12.0–46.0)
Lymphs Abs: 3.5 10*3/uL (ref 0.7–4.0)
MCHC: 33.7 g/dL (ref 30.0–36.0)
MCV: 92.8 fl (ref 78.0–100.0)
MONO ABS: 0.6 10*3/uL (ref 0.1–1.0)
Monocytes Relative: 8 % (ref 3.0–12.0)
NEUTROS ABS: 3.1 10*3/uL (ref 1.4–7.7)
Neutrophils Relative %: 41.4 % — ABNORMAL LOW (ref 43.0–77.0)
PLATELETS: 203 10*3/uL (ref 150.0–400.0)
RBC: 4.24 Mil/uL (ref 3.87–5.11)
RDW: 13.2 % (ref 11.5–15.5)
WBC: 7.5 10*3/uL (ref 4.0–10.5)

## 2015-07-15 LAB — BASIC METABOLIC PANEL
BUN: 16 mg/dL (ref 6–23)
CALCIUM: 9.4 mg/dL (ref 8.4–10.5)
CO2: 28 mEq/L (ref 19–32)
CREATININE: 0.89 mg/dL (ref 0.40–1.20)
Chloride: 99 mEq/L (ref 96–112)
GFR: 66.35 mL/min (ref >60.00)
Glucose, Bld: 89 mg/dL (ref 70–99)
Potassium: 4.5 mEq/L (ref 3.5–5.1)
Sodium: 134 mEq/L — ABNORMAL LOW (ref 135–145)

## 2015-07-15 LAB — HEPATIC FUNCTION PANEL
ALT: 15 U/L (ref 0–35)
AST: 19 U/L (ref 0–37)
Albumin: 3.9 g/dL (ref 3.5–5.2)
Alkaline Phosphatase: 48 U/L (ref 39–117)
BILIRUBIN DIRECT: 0.1 mg/dL (ref 0.0–0.3)
TOTAL PROTEIN: 6.6 g/dL (ref 6.0–8.3)
Total Bilirubin: 0.3 mg/dL (ref 0.2–1.2)

## 2015-07-15 LAB — TSH: TSH: 3.42 u[IU]/mL (ref 0.35–4.50)

## 2015-07-15 LAB — T4, FREE: FREE T4: 0.87 ng/dL (ref 0.60–1.60)

## 2015-07-15 NOTE — Telephone Encounter (Signed)
Follow up      Talk to [facility].  Pt is wearing a holter monitor and will be returning it today.  She want to know if we can do some bloodwork while she is here.

## 2015-07-15 NOTE — Telephone Encounter (Signed)
Spoke with patient and she has continued to have palpitations  Patient requested labs and again went over how obtaining a Troponin level at this time would not be very useful since back pain over a week ago  Patient has no known CAD Did schedule cbc/bmet/tsh/ft4 when she returns her monitor today  Advised will have  Dr. Mare Ferrari review monitor and labs when they are back next week Patient denies any other symptoms other than fatigue and intermittent palpitations

## 2015-07-15 NOTE — Telephone Encounter (Signed)
New Message        Pt's daughter calling stating that in addition to the [facility] panel, the daughter is requesting T4, T3, Free T4, Free T3, TSI, TPOAB. Daughter also wants to discuss if the pt can be put on a medication to help with her heart rate. Please call back and advise.

## 2015-07-15 NOTE — Progress Notes (Signed)
Patient came in to office today to return 48 hour monitor and lab work. Did obtain EKG which was reviewed by Dr Ron Parker. Patient has been having more frequent PVC's. Patient has been taking her Atenolol twice a day but continues to have PVC's. Having some increased fatigue. No med changes made and will await holter monitor and lab results.

## 2015-07-15 NOTE — Telephone Encounter (Signed)
Spoke with daughter and will get EKG when patient comes in for labs No change in labs ordered until  Dr. Mare Ferrari reviews information next week (monitor and labs) Daughter ok with plan

## 2015-07-18 NOTE — Telephone Encounter (Signed)
Agree with advice given

## 2015-07-19 NOTE — Progress Notes (Signed)
Quick Note:  Please report to patient. The recent labs are stable. Continue same medication and careful diet. Thyroid is normal. ______

## 2015-07-22 ENCOUNTER — Telehealth: Payer: Self-pay | Admitting: *Deleted

## 2015-07-22 NOTE — Telephone Encounter (Signed)
-----   Message from Darlin Coco, MD sent at 07/20/2015  5:29 PM EDT ----- Please report.  The Holter did not show any dangerous rhythm.  She does have frequent PVCs. Recommend increasing atenolol to 50 mg BID and see if this helps. Avoid excessive caffeine, chocolate , stress etc.

## 2015-07-22 NOTE — Telephone Encounter (Signed)
Advised of results  Patient did state while speaking with her PVC's much better so she will continue current dose and call back if needs to increase  Discussed with  Dr. Mare Ferrari and he is agreeable to plan

## 2015-08-08 ENCOUNTER — Other Ambulatory Visit: Payer: Self-pay | Admitting: Cardiology

## 2015-08-08 DIAGNOSIS — F419 Anxiety disorder, unspecified: Secondary | ICD-10-CM

## 2015-08-08 NOTE — Telephone Encounter (Signed)
Okay to refill lorazepam

## 2015-08-08 NOTE — Telephone Encounter (Signed)
Pt requesting a refill on Ativan 1 mg. Would you like to refill this medication? Please advise

## 2015-08-12 ENCOUNTER — Other Ambulatory Visit: Payer: Self-pay | Admitting: Cardiology

## 2015-09-06 ENCOUNTER — Telehealth: Payer: Self-pay

## 2015-09-06 NOTE — Telephone Encounter (Signed)
LMOVM - Pt to return call to clinic.  Believe she is screening calls.

## 2015-09-06 NOTE — Assessment & Plan Note (Signed)
Right breast DCIS diagnosed February 2011 treated with lumpectomy and radiation and is currently on tamoxifen since May 2011 ER 2% PR 0%  Tamoxifen toxicities: No major side effects of tamoxifen tolerating it fairly well. Patient to complete tamoxifen therapy by May 2016   Breast cancer surveillance: 1. Breast exam 09/06/2015 normal 2. Mammogram 12/21/2014: Abnormal calcifications. She underwent stereotactic biopsy which showed atrophic respiratory, with fat necrosis and calcifications  Survivorship:Discussed the importance of physical exercise in decreasing the likelihood of breast cancer recurrence. Recommended 30 mins daily 6 days a week of either brisk walking or cycling or swimming. Encouraged patient to eat more fruits and vegetables and decrease red meat.   Return to clinic once a year for follow-up and breast exams.

## 2015-09-06 NOTE — Telephone Encounter (Signed)
LMOVM - Pt to return call to clinic.   

## 2015-09-07 ENCOUNTER — Encounter: Payer: Self-pay | Admitting: Hematology and Oncology

## 2015-09-07 ENCOUNTER — Ambulatory Visit (HOSPITAL_BASED_OUTPATIENT_CLINIC_OR_DEPARTMENT_OTHER): Payer: Medicare Other | Admitting: Hematology and Oncology

## 2015-09-07 ENCOUNTER — Telehealth: Payer: Self-pay | Admitting: Hematology and Oncology

## 2015-09-07 VITALS — BP 168/67 | HR 77 | Temp 97.9°F | Resp 17 | Ht 66.0 in | Wt 154.3 lb

## 2015-09-07 DIAGNOSIS — L539 Erythematous condition, unspecified: Secondary | ICD-10-CM | POA: Diagnosis not present

## 2015-09-07 DIAGNOSIS — D0511 Intraductal carcinoma in situ of right breast: Secondary | ICD-10-CM | POA: Diagnosis not present

## 2015-09-07 DIAGNOSIS — C50511 Malignant neoplasm of lower-outer quadrant of right female breast: Secondary | ICD-10-CM

## 2015-09-07 NOTE — Addendum Note (Signed)
Addended by: Prentiss Bells on: 09/07/2015 05:45 PM   Modules accepted: Medications

## 2015-09-07 NOTE — Telephone Encounter (Signed)
Called patient and left a message with the mammo and follow up in may 2017

## 2015-09-07 NOTE — Progress Notes (Signed)
Patient Care Team: Darlin Coco, MD as PCP - General (Cardiology)  DIAGNOSIS: No matching staging information was found for the patient.  SUMMARY OF ONCOLOGIC HISTORY:   Breast cancer, right breast ([place])   11/16/2009 Initial Diagnosis DCIS high-grade with associated microcalcification and necrosis 0.6 cm ER 2%, PR 0%   12/15/2009 Surgery Right breast lumpectomy no residual DCIS   01/25/2010 - 02/20/2010 Radiation Therapy Adjuvant radiation therapy   03/12/2010 -  Anti-estrogen oral therapy Tamoxifen 20 mg daily    CHIEF COMPLIANT: Skin changes left breast upper inner quadrant  INTERVAL HISTORY: Lisa Cox is a 61 with above-mentioned history of DCIS who completed 5 years of tamoxifen therapy in May 2016, she is here because she had noticed a skin lesion on the left breast upper inner quadrant and she wanted to make sure that it is nothing of significance. Her daughter was recently diagnosed basal cell cancer and she is worried about skin cancer risk. She denies any pain or discomfort. Denies any lumps or nodules on this area.  REVIEW OF SYSTEMS:   Constitutional: Denies fevers, chills or abnormal weight loss Eyes: Denies blurriness of vision Ears, nose, mouth, throat, and face: Denies mucositis or sore throat Respiratory: Denies cough, dyspnea or wheezes Cardiovascular: Denies palpitation, chest discomfort or lower extremity swelling Gastrointestinal:  Denies nausea, heartburn or change in bowel habits Skin: Denies abnormal skin rashes Lymphatics: Denies new lymphadenopathy or easy bruising Neurological:Denies numbness, tingling or new weaknesses Behavioral/Psych: Mood is stable, no new changes  Breast: Left breast upper inner quadrant skin changes All other systems were reviewed with the patient and are negative.  I have reviewed the past medical history, past surgical history, social history and family history with the patient and they are unchanged from previous  note.  ALLERGIES:  is allergic to amlodipine; crestor; erythromycin; macrobid; niacin and related; restoril; and zolpidem tartrate.  MEDICATIONS:  Current Outpatient Prescriptions  Medication Sig Dispense Refill  . acetaminophen (TYLENOL) 500 MG tablet Take 1,000 mg by mouth every 6 (six) hours as needed. For cramping.    Marland Kitchen aspirin 81 MG tablet Take 81 mg by mouth once. Does not take on a daily basis    . atenolol (TENORMIN) 25 MG tablet 1 TABLET TWICE A DAY OR AS DIRECTED 180 tablet 3  . b complex vitamins tablet Take 1 tablet by mouth daily. Takes sometimes    . carboxymethylcellulose (REFRESH TEARS) 0.5 % SOLN Place 1 drop into both eyes 3 (three) times daily as needed.     . dimenhyDRINATE (DRAMAMINE) 50 MG tablet Take 50 mg by mouth at bedtime.    Marland Kitchen LORazepam (ATIVAN) 1 MG tablet TAKE 1 TABLET TWICE DAILY AS NEEDED 60 tablet 5  . Omega-3 Fatty Acids (FISH OIL CONCENTRATE PO) Take 1 capsule by mouth daily.     . ramipril (ALTACE) 10 MG capsule Take 1 capsule (10 mg total) by mouth daily. 90 capsule 3  . ranitidine (ZANTAC) 75 MG tablet Take 75 mg by mouth at bedtime.     No current facility-administered medications for this visit.    PHYSICAL EXAMINATION: ECOG PERFORMANCE STATUS: 1 - Symptomatic but completely ambulatory  Filed Vitals:   09/07/15 1137  BP: 168/67  Pulse: 77  Temp: 97.9 F (36.6 C)  Resp: 17   Filed Weights   09/07/15 1137  Weight: 154 lb 4.8 oz (69.99 kg)    GENERAL:alert, no distress and comfortable SKIN: skin color, texture, turgor are normal, no rashes or  significant lesions EYES: normal, Conjunctiva are pink and non-injected, sclera clear OROPHARYNX:no exudate, no erythema and lips, buccal mucosa, and tongue normal  NECK: supple, thyroid normal size, non-tender, without nodularity LYMPH:  no palpable lymphadenopathy in the cervical, axillary or inguinal LUNGS: clear to auscultation and percussion with normal breathing effort HEART: regular rate &  rhythm and no murmurs and no lower extremity edema ABDOMEN:abdomen soft, non-tender and normal bowel sounds Musculoskeletal:no cyanosis of digits and no clubbing  NEURO: alert & oriented x 3 with fluent speech, no focal motor/sensory deficits BREAST: Small skin lesion on the left breast upper inner quadrant with a shining surface mild erythema surrounding it. (exam performed in the presence of a chaperone)  LABORATORY DATA:  I have reviewed the data as listed   Chemistry      Component Value Date/Time   NA 134* 07/15/2015 1521   NA 137 10/12/2014 1458   K 4.5 07/15/2015 1521   K 4.1 10/12/2014 1458   CL 99 07/15/2015 1521   CL 103 01/16/2013 1020   CO2 28 07/15/2015 1521   CO2 28 10/12/2014 1458   BUN 16 07/15/2015 1521   BUN 12.5 10/12/2014 1458   CREATININE 0.89 07/15/2015 1521   CREATININE 0.83 02/24/2015 1016   CREATININE 0.9 10/12/2014 1458      Component Value Date/Time   CALCIUM 9.4 07/15/2015 1521   CALCIUM 8.9 10/12/2014 1458   ALKPHOS 48 07/15/2015 1521   ALKPHOS 42 10/12/2014 1458   AST 19 07/15/2015 1521   AST 18 10/12/2014 1458   ALT 15 07/15/2015 1521   ALT 16 10/12/2014 1458   BILITOT 0.3 07/15/2015 1521   BILITOT 0.27 10/12/2014 1458       Lab Results  Component Value Date   WBC 7.5 07/15/2015   HGB 13.3 07/15/2015   HCT 39.4 07/15/2015   MCV 92.8 07/15/2015   PLT 203.0 07/15/2015   NEUTROABS 3.1 07/15/2015   ASSESSMENT & PLAN:  Breast cancer, right breast Right breast DCIS diagnosed February 2011 treated with lumpectomy and radiation and is currently on tamoxifen since May 2011 ER 2% PR 0%  Tamoxifen toxicities: No major side effects of tamoxifen tolerating it fairly well. Patient completed tamoxifen therapy in May 2016   Breast cancer surveillance: 1. Breast exam 09/06/2015 left breast upper inner quadrant there appears to be a area of skin erythema with scaling on top of it. She is reports that it's been there for about a month. She is  concerned about fluid migration from a prior benign biopsy. I will obtain another mammogram of the left breast to evaluate this issue. If it is not related to any of the clip migration, then she'll make an appointment with dermatologist to get a biopsy of this. She is very concerned because her daughter was diagnosed with basal cell cancer.  2. Mammogram 12/21/2014: Abnormal calcifications. She underwent stereotactic biopsy which showed atrophic, with fat necrosis and calcifications  Survivorship:Discussed the importance of physical exercise in decreasing the likelihood of breast cancer recurrence. Recommended 30 mins daily 6 days a week of either brisk walking or cycling or swimming. Encouraged patient to eat more fruits and vegetables and decrease red meat.   Return to clinic in 6 months for follow-up and breast exams.   No orders of the defined types were placed in this encounter.   The patient has a good understanding of the overall plan. she agrees with it. she will call with any problems that may develop before the  next visit here.   Rulon Eisenmenger, MD 09/07/2015

## 2015-09-14 ENCOUNTER — Ambulatory Visit
Admission: RE | Admit: 2015-09-14 | Discharge: 2015-09-14 | Disposition: A | Discharge status: Home / Self Care | Payer: Medicare Other | Source: Ambulatory Visit | Attending: Hematology and Oncology | Admitting: Hematology and Oncology

## 2015-09-14 ENCOUNTER — Other Ambulatory Visit: Payer: Self-pay | Admitting: Hematology and Oncology

## 2015-09-14 DIAGNOSIS — C50511 Malignant neoplasm of lower-outer quadrant of right female breast: Secondary | ICD-10-CM

## 2015-10-11 ENCOUNTER — Other Ambulatory Visit: Payer: Medicare Other

## 2015-10-11 ENCOUNTER — Ambulatory Visit: Payer: Medicare Other | Admitting: Hematology and Oncology

## 2015-11-18 ENCOUNTER — Ambulatory Visit (INDEPENDENT_AMBULATORY_CARE_PROVIDER_SITE_OTHER): Payer: Medicare Other | Admitting: Family Medicine

## 2015-11-18 VITALS — BP 128/82 | HR 76 | Temp 98.4°F | Resp 18 | Wt 153.6 lb

## 2015-11-18 DIAGNOSIS — N3000 Acute cystitis without hematuria: Secondary | ICD-10-CM

## 2015-11-18 DIAGNOSIS — R309 Painful micturition, unspecified: Secondary | ICD-10-CM

## 2015-11-18 LAB — POC MICROSCOPIC URINALYSIS (UMFC): Mucus: ABSENT

## 2015-11-18 LAB — POCT URINALYSIS DIP (MANUAL ENTRY)
Glucose, UA: 100 — AB
NITRITE UA: POSITIVE — AB
PH UA: 5
Spec Grav, UA: 1.02
UROBILINOGEN UA: 2

## 2015-11-18 MED ORDER — CEPHALEXIN 500 MG PO CAPS: 500.0000 mg | ORAL_CAPSULE | Freq: Four times a day (QID) | ORAL | Status: DC

## 2015-11-18 NOTE — Addendum Note (Signed)
Addended by: Constance Goltz on: 11/18/2015 09:10 AM   Modules accepted: Miquel Dunn

## 2015-11-18 NOTE — Progress Notes (Signed)
Patient ID: Lisa Cox, female    DOB: 07-Dec-1945  Age: 70 y.o. MRN: [id number]  Chief Complaint  Patient presents with  . Urinary Tract Infection    burning sensation x3 days    Subjective:   70 year old lady who is here for a probable urinary tract infection. She has had some pressure and discomfort for the last 3 days. She has been taking some Pyridium. She has had a history of having urinary tract infections in the past, more a she is gotten a little older. She's not been febrile or had flank pains.  Current allergies, medications, problem list, past/family and social histories reviewed.  Objective:  BP 128/82 mmHg  Pulse 76  Temp(Src) 98.4 F (36.9 C) (Oral)  Resp 18  Wt 153 lb 9.6 oz (69.673 kg)  SpO2 98%  No CVA tenderness. Abdomen soft without masses or tenderness.   Assessment & Plan:   Assessment: 1. Painful urination   2. Acute cystitis without hematuria       Plan: Will culture the urine  Orders Placed This Encounter  Procedures  . POCT urinalysis dipstick  . POCT Microscopic Urinalysis ([facility])   Results for orders placed or performed in visit on 11/18/15  POCT urinalysis dipstick  Result Value Ref Range   Color, UA orange (A) yellow   Clarity, UA cloudy (A) clear   Glucose, UA =100 (A) negative   Bilirubin, UA small (A) negative   Ketones, POC UA trace (5) (A) negative   Spec Grav, UA 1.020    Blood, UA moderate (A) negative   pH, UA 5.0    Protein Ur, POC =100 (A) negative   Urobilinogen, UA 2.0    Nitrite, UA Positive (A) Negative   Leukocytes, UA small (1+) (A) Negative    Meds ordered this encounter  Medications  . cephALEXin (KEFLEX) 500 MG capsule    Sig: Take 1 capsule (500 mg total) by mouth 4 (four) times daily.    Dispense:  15 capsule    Refill:  0         Patient Instructions  Take cephalexin 500 mg 1 pill 3 times daily for 5 days  Drink plenty of fluids  We will let you know if the culture shows anything  differently  In the event of fever or chills or generally getting sicker get rechecked immediately  Return as needed     Return if symptoms worsen or fail to improve.   HOPPER,DAVID, MD 11/18/2015

## 2015-11-18 NOTE — Patient Instructions (Signed)
Take cephalexin 500 mg 1 pill 3 times daily for 5 days  Drink plenty of fluids  We will let you know if the culture shows anything differently  In the event of fever or chills or generally getting sicker get rechecked immediately  Return as needed

## 2015-11-20 LAB — URINE CULTURE

## 2015-12-08 ENCOUNTER — Other Ambulatory Visit: Payer: Self-pay | Admitting: Hematology and Oncology

## 2015-12-08 ENCOUNTER — Other Ambulatory Visit: Payer: Self-pay

## 2015-12-08 DIAGNOSIS — R921 Mammographic calcification found on diagnostic imaging of breast: Secondary | ICD-10-CM

## 2015-12-08 DIAGNOSIS — Z9889 Other specified postprocedural states: Secondary | ICD-10-CM

## 2015-12-15 ENCOUNTER — Other Ambulatory Visit: Payer: Self-pay | Admitting: *Deleted

## 2015-12-15 MED ORDER — RAMIPRIL 10 MG PO CAPS: 10.0000 mg | ORAL_CAPSULE | Freq: Every day | ORAL | Status: DC

## 2015-12-19 ENCOUNTER — Ambulatory Visit (INDEPENDENT_AMBULATORY_CARE_PROVIDER_SITE_OTHER): Payer: Medicare Other | Admitting: Cardiology

## 2015-12-19 ENCOUNTER — Other Ambulatory Visit: Payer: Medicare Other

## 2015-12-19 ENCOUNTER — Ambulatory Visit: Payer: Medicare Other | Admitting: Cardiology

## 2015-12-19 ENCOUNTER — Encounter: Payer: Self-pay | Admitting: Cardiology

## 2015-12-19 DIAGNOSIS — E7800 Pure hypercholesterolemia, unspecified: Secondary | ICD-10-CM

## 2015-12-19 DIAGNOSIS — I119 Hypertensive heart disease without heart failure: Secondary | ICD-10-CM

## 2015-12-19 LAB — LIPID PANEL
CHOLESTEROL: 194 mg/dL (ref 125–200)
HDL: 41 mg/dL — ABNORMAL LOW (ref >=46)
LDL Cholesterol: 127 mg/dL (ref <130)
TRIGLYCERIDES: 130 mg/dL (ref <150)
Total CHOL/HDL Ratio: 4.7 Ratio (ref <=5.0)
VLDL: 26 mg/dL (ref <30)

## 2015-12-19 LAB — CBC WITH DIFFERENTIAL/PLATELET
BASOS ABS: 0.1 10*3/uL (ref 0.0–0.1)
BASOS PCT: 1 % (ref 0–1)
EOS ABS: 0.3 10*3/uL (ref 0.0–0.7)
EOS PCT: 4 % (ref 0–5)
HCT: 38.2 % (ref 36.0–46.0)
Hemoglobin: 12.9 g/dL (ref 12.0–15.0)
LYMPHS ABS: 3.3 10*3/uL (ref 0.7–4.0)
Lymphocytes Relative: 49 % — ABNORMAL HIGH (ref 12–46)
MCH: 31 pg (ref 26.0–34.0)
MCHC: 33.8 g/dL (ref 30.0–36.0)
MCV: 91.8 fL (ref 78.0–100.0)
MONOS PCT: 9 % (ref 3–12)
MPV: 9.6 fL (ref 8.6–12.4)
Monocytes Absolute: 0.6 10*3/uL (ref 0.1–1.0)
NEUTROS PCT: 37 % — AB (ref 43–77)
Neutro Abs: 2.5 10*3/uL (ref 1.7–7.7)
PLATELETS: 213 10*3/uL (ref 150–400)
RBC: 4.16 MIL/uL (ref 3.87–5.11)
RDW: 13.6 % (ref 11.5–15.5)
WBC: 6.7 10*3/uL (ref 4.0–10.5)

## 2015-12-19 LAB — HEPATIC FUNCTION PANEL
ALT: 15 U/L (ref 6–29)
AST: 20 U/L (ref 10–35)
Albumin: 3.9 g/dL (ref 3.6–5.1)
Alkaline Phosphatase: 56 U/L (ref 33–130)
Bilirubin, Direct: 0.1 mg/dL (ref <=0.2)
Indirect Bilirubin: 0.3 mg/dL (ref 0.2–1.2)
TOTAL PROTEIN: 6.6 g/dL (ref 6.1–8.1)
Total Bilirubin: 0.4 mg/dL (ref 0.2–1.2)

## 2015-12-19 LAB — BASIC METABOLIC PANEL
BUN: 11 mg/dL (ref 7–25)
CALCIUM: 9 mg/dL (ref 8.6–10.4)
CHLORIDE: 102 mmol/L (ref 98–110)
CO2: 29 mmol/L (ref 20–31)
CREATININE: 0.95 mg/dL — AB (ref 0.60–0.93)
Glucose, Bld: 87 mg/dL (ref 65–99)
Potassium: 4.2 mmol/L (ref 3.5–5.3)
Sodium: 139 mmol/L (ref 135–146)

## 2015-12-19 NOTE — Progress Notes (Signed)
Cardiology Office Note   Date:  12/19/2015   ID:  Cox ESWORTHY, DOB 1946/05/07, MRN [id number]  PCP:  Lisa Blinks, MD  Cardiologist: Darlin Coco MD  Chief Complaint  Patient presents with  . scheduled follow up    benign hypertensive disease without heart failure      History of Present Illness: Lisa Cox is a 70 y.o. female who presents for a six-month follow-up visit  This pleasant 70 year old woman is seen for a scheduled followup office visit. She has a past history of palpitations and a history of hypercholesterolemia. Since last visit she's been doing well no new cardiac complaints. She is retired now and she stays busy taking after several grandchildren on a regular basis. She has a past history of breast cancer more than 5 years ago. She has finished up 5 years of tamoxifen.. In December 2013 she had an episode of rectal bleeding followed by syncope. She went to the emergency room and was checked for syncope and discharged. She has had no further syncopal episodes.   She has had a history of fluctuating blood pressures. She is on ramipril 10 mg daily. She did not tolerate amlodipine because of edema.  She has a history of urinary tract infections associated with microscopic hematuria.  Dr. Tresa Cox is her urologist.  She has not been having any chest pain or shortness of breath. Energy level is good. Her palpitations are infrequent. She gets exercise by yard work and mowing her grass and looking after grandchildren.  She has not been aware of any new cardiac symptoms.  Specifically no chest pain or increased shortness of breath or palpitations.  Past Medical History  Diagnosis Date  . Hypertension   . Bell's palsy 2009  . Hyperlipidemia   . Palpitations     hx of  . Cancer ([place]) Feb.2011    breast,lumpectomy right side followed by radiation  . GERD (gastroesophageal reflux disease)   . Depression   . Anxiety     Past Surgical History    Procedure Laterality Date  . Tubal ligation  1978  . Breast lumpectomy  Feb. 2011    right breast,followed by radiation therapy  . Eye surgery  1953    skin removal in eye     Current Outpatient Prescriptions  Medication Sig Dispense Refill  . acetaminophen (TYLENOL) 500 MG tablet Take 1,000 mg by mouth every 6 (six) hours as needed. For cramping.    Marland Kitchen aspirin 81 MG tablet Take 81 mg by mouth daily as needed (blood thinner). Does not take on a daily basis    . atenolol (TENORMIN) 25 MG tablet Take 25 mg by mouth 2 (two) times daily. Or as directed by your doctor    . b complex vitamins tablet Take 1 tablet by mouth daily as needed (supplement).     . carboxymethylcellulose (REFRESH TEARS) 0.5 % SOLN Place 1 drop into both eyes 3 (three) times daily as needed (dry eye).     Marland Kitchen dimenhyDRINATE (DRAMAMINE) 50 MG tablet Take 50 mg by mouth at bedtime.    Marland Kitchen LORazepam (ATIVAN) 1 MG tablet Take 1 mg by mouth 2 (two) times daily as needed for anxiety.    . Omega-3 Fatty Acids (FISH OIL CONCENTRATE PO) Take 1 capsule by mouth daily.     . ramipril (ALTACE) 10 MG capsule Take 1 capsule (10 mg total) by mouth daily. 90 capsule 3  . ranitidine (ZANTAC) 75 MG tablet Take 75 mg  by mouth at bedtime.     No current facility-administered medications for this visit.    Allergies:   Amlodipine; Crestor; Erythromycin; Macrobid; Niacin and related; Restoril; and Zolpidem tartrate    Social History:  The patient  reports that she has never smoked. She does not have any smokeless tobacco history on file. She reports that she does not drink alcohol or use illicit drugs.   Family History:  The patient's family history includes Cancer in her father; Heart attack in her father; Heart disease in her father; Hypertension in her brother and sister; Stroke in her maternal aunt.    ROS:  Please see the history of present illness.   Otherwise, review of systems are positive for none.   All other systems are reviewed  and negative.    PHYSICAL EXAM: VS:  BP 140/74 mmHg  Pulse 76  Ht 5' 5.5" (1.664 m)  Wt 152 lb 12.8 oz (69.31 kg)  BMI 25.03 kg/m2 , BMI Body mass index is 25.03 kg/(m^2). GEN: Well nourished, well developed, in no acute distress HEENT: normal  Neck: no JVD, carotid bruits, or masses Cardiac: RRR; no murmurs, rubs, or gallops,no edema  Respiratory:  clear to auscultation bilaterally, normal work of breathing GI: soft, nontender, nondistended, + BS MS: no deformity or atrophy Skin: warm and dry, no rash Neuro:  Strength and sensation are intact Psych: euthymic mood, full affect   EKG:  EKG is not ordered today.    Recent Labs: 07/15/2015: ALT 15; BUN 16; Creatinine, Ser 0.89; Hemoglobin 13.3; Platelets 203.0; Potassium 4.5; Sodium 134*; TSH 3.42    Lipid Panel    Component Value Date/Time   CHOL 156 11/30/2014 0946   TRIG 179.0* 11/30/2014 0946   HDL 39.90 11/30/2014 0946   CHOLHDL 4 11/30/2014 0946   VLDL 35.8 11/30/2014 0946   LDLCALC 80 11/30/2014 0946      Wt Readings from Last 3 Encounters:  12/19/15 152 lb 12.8 oz (69.31 kg)  11/18/15 153 lb 9.6 oz (69.673 kg)  09/07/15 154 lb 4.8 oz (69.99 kg)        ASSESSMENT AND PLAN: 1. essential hypertension 2. Hypercholesterolemia 3. past history of breast cancer of right breast treated with lumpectomy and radiation and she finished tamoxifen in May 2016 4. History of microscopic hematuria followed by Dr. Tresa Cox    Current medicines are reviewed at length with the patient today.  The patient does not have concerns regarding medicines.  The following changes have been made:  no change  Labs/ tests ordered today include:  No orders of the defined types were placed in this encounter.     Disposition:   Continue current medication.  Following my retirement she will follow-up with Dr. Glenetta Hew at [facility] in 6 months.  We are checking lab work today results pending.  Berna Spare  MD 12/19/2015 9:10 AM    [facility] [address], [place], [place]  [address] Phone: [phone]; Fax: [phone]

## 2015-12-19 NOTE — Patient Instructions (Addendum)
Medication Instructions:  Your physician recommends that you continue on your current medications as directed. Please refer to the Current Medication list given to you today.  Labwork: LP.BMET/HFP/CBC  Testing/Procedures: NONE  Follow-Up: Your physician wants you to follow-up in: [place] will receive a reminder letter in the mail two months in advance. If you don't receive a letter, please call our office to schedule the follow-up appointment.  If you need a refill on your cardiac medications before your next appointment, please call your pharmacy.

## 2015-12-20 NOTE — Progress Notes (Signed)
Quick Note:  Please report to patient. The recent labs are stable. Continue same medication and careful diet. ______ 

## 2016-01-23 ENCOUNTER — Ambulatory Visit (INDEPENDENT_AMBULATORY_CARE_PROVIDER_SITE_OTHER): Payer: Medicare Other | Admitting: Family Medicine

## 2016-01-23 VITALS — BP 178/74 | HR 80 | Temp 98.0°F | Resp 16 | Ht 65.5 in | Wt 154.0 lb

## 2016-01-23 DIAGNOSIS — IMO0001 Reserved for inherently not codable concepts without codable children: Secondary | ICD-10-CM

## 2016-01-23 DIAGNOSIS — R300 Dysuria: Secondary | ICD-10-CM

## 2016-01-23 DIAGNOSIS — R350 Frequency of micturition: Secondary | ICD-10-CM

## 2016-01-23 DIAGNOSIS — N3090 Cystitis, unspecified without hematuria: Secondary | ICD-10-CM

## 2016-01-23 DIAGNOSIS — I10 Essential (primary) hypertension: Secondary | ICD-10-CM | POA: Diagnosis not present

## 2016-01-23 LAB — POCT URINALYSIS DIP (MANUAL ENTRY)
Bilirubin, UA: NEGATIVE
GLUCOSE UA: NEGATIVE
Ketones, POC UA: NEGATIVE
Nitrite, UA: POSITIVE — AB
PH UA: 7
Protein Ur, POC: NEGATIVE
SPEC GRAV UA: 1.015
UROBILINOGEN UA: 0.2

## 2016-01-23 LAB — POC MICROSCOPIC URINALYSIS (UMFC): Mucus: ABSENT

## 2016-01-23 MED ORDER — PHENAZOPYRIDINE HCL 95 MG PO TABS: 95.0000 mg | ORAL_TABLET | Freq: Three times a day (TID) | ORAL | Status: DC | PRN

## 2016-01-23 MED ORDER — CEPHALEXIN 500 MG PO CAPS: 500.0000 mg | ORAL_CAPSULE | Freq: Two times a day (BID) | ORAL | Status: DC

## 2016-01-23 NOTE — Patient Instructions (Addendum)
   IF you received an x-ray today, you will receive an invoice from [facility]. Please contact [facility] at [phone] with questions or concerns regarding your invoice.   IF you received labwork today, you will receive an invoice from [facility]. Please contact [facility] at [phone] with questions or concerns regarding your invoice.   Our billing staff will not be able to assist you with questions regarding bills from these companies.  You will be contacted with the lab results as soon as they are available. The fastest way to get your results is to activate your My Chart account. Instructions are located on the last page of this paperwork. If you have not heard from us regarding the results in 2 weeks, please contact this office.     Urinary Tract Infection Urinary tract infections (UTIs) can develop anywhere along your urinary tract. Your urinary tract is your body's drainage system for removing wastes and extra water. Your urinary tract includes two kidneys, two ureters, a bladder, and a urethra. Your kidneys are a pair of bean-shaped organs. Each kidney is about the size of your fist. They are located below your ribs, one on each side of your spine. CAUSES Infections are caused by microbes, which are microscopic organisms, including fungi, viruses, and bacteria. These organisms are so small that they can only be seen through a microscope. Bacteria are the microbes that most commonly cause UTIs. SYMPTOMS  Symptoms of UTIs may vary by age and gender of the patient and by the location of the infection. Symptoms in young women typically include a frequent and intense urge to urinate and a painful, burning feeling in the bladder or urethra during urination. Older women and men are more likely to be tired, shaky, and weak and have muscle aches and abdominal pain. A fever may mean the infection is in your kidneys. Other symptoms of a kidney  infection include pain in your back or sides below the ribs, nausea, and vomiting. DIAGNOSIS To diagnose a UTI, your caregiver will ask you about your symptoms. Your caregiver will also ask you to provide a urine sample. The urine sample will be tested for bacteria and white blood cells. White blood cells are made by your body to help fight infection. TREATMENT  Typically, UTIs can be treated with medication. Because most UTIs are caused by a bacterial infection, they usually can be treated with the use of antibiotics. The choice of antibiotic and length of treatment depend on your symptoms and the type of bacteria causing your infection. HOME CARE INSTRUCTIONS  If you were prescribed antibiotics, take them exactly as your caregiver instructs you. Finish the medication even if you feel better after you have only taken some of the medication.  Drink enough water and fluids to keep your urine clear or pale yellow.  Avoid caffeine, tea, and carbonated beverages. They tend to irritate your bladder.  Empty your bladder often. Avoid holding urine for long periods of time.  Empty your bladder before and after sexual intercourse.  After a bowel movement, women should cleanse from front to back. Use each tissue only once. SEEK MEDICAL CARE IF:   You have back pain.  You develop a fever.  Your symptoms do not begin to resolve within 3 days. SEEK IMMEDIATE MEDICAL CARE IF:   You have severe back pain or lower abdominal pain.  You develop chills.  You have nausea or vomiting.  You have continued burning or discomfort with urination.   MAKE SURE YOU:   Understand these instructions.  Will watch your condition.  Will get help right away if you are not doing well or get worse.   This information is not intended to replace advice given to you by your health care provider. Make sure you discuss any questions you have with your health care provider.   Document Released: 07/18/2005 Document  Revised: 06/29/2015 Document Reviewed: 11/16/2011 Elsevier Interactive Patient Education 2016 [employer]  

## 2016-01-23 NOTE — Progress Notes (Signed)
[place] at [facility] [address] [place], [place] [address] [id number] [phone]  Date:  01/23/2016   Name:  CECILLA BUSSEY   DOB:  [birth year]   MRN:  [id number]  PCP:  Lamar Blinks, MD    Chief Complaint: Urinary Frequency; Dysuria; and Nausea   History of Present Illness:  Lisa Cox is a 70 y.o. very pleasant female patient who presents with the following: - Began 3 days ago - Trying AZO - Dysuria, spasm - Nausea last 24 hours.  - Taking ramipril and atenolol this AM - Typical UTI symptoms.  - 3-4 UTIs in the last year, mostly managed by Urology.   -- Reportedly d/c from urology after being followed for trace hematuria -- Last treated UTI was 2 months ago (seen here) with keflex    Patient Active Problem List   Diagnosis Date Noted  . Hematochezia 05/20/2013  . Breast cancer, right breast ([place]) 09/29/2011  . Hypercholesterolemia 07/11/2011  . Benign hypertensive heart disease without heart failure 07/11/2011  . Palpitations     Past Medical History  Diagnosis Date  . Hypertension   . Bell's palsy 2009  . Hyperlipidemia   . Palpitations     hx of  . Cancer ([facility]) Feb.2011    breast,lumpectomy right side followed by radiation  . GERD (gastroesophageal reflux disease)   . Depression   . Anxiety     Past Surgical History  Procedure Laterality Date  . Tubal ligation  1978  . Breast lumpectomy  Feb. 2011    right breast,followed by radiation therapy  . Eye surgery  1953    skin removal in eye    Social History  Substance Use Topics  . Smoking status: Never Smoker   . Smokeless tobacco: None  . Alcohol Use: No    Family History  Problem Relation Age of Onset  . Cancer Father     pancreatic  . Heart disease Father   . Hypertension Sister   . Hypertension Brother   . Heart attack Father   . Stroke Maternal Aunt     Allergies  Allergen Reactions  . Amlodipine     Peripheral edema  .  Crestor [Rosuvastatin Calcium]     Leg cramps  . Erythromycin     nausea  . Macrobid [Nitrofurantoin Monohyd Macro] Nausea And Vomiting  . Niacin And Related Itching  . Restoril     Reaction-"nervous"  . Zolpidem Tartrate     Reaction-"nervous"    Medication list has been reviewed and updated.  Current Outpatient Prescriptions on File Prior to Visit  Medication Sig Dispense Refill  . acetaminophen (TYLENOL) 500 MG tablet Take 1,000 mg by mouth every 6 (six) hours as needed. For cramping.    Marland Kitchen aspirin 81 MG tablet Take 81 mg by mouth daily as needed (blood thinner). Does not take on a daily basis    . atenolol (TENORMIN) 25 MG tablet Take 25 mg by mouth 2 (two) times daily. Or as directed by your doctor    . b complex vitamins tablet Take 1 tablet by mouth daily as needed (supplement).     . carboxymethylcellulose (REFRESH TEARS) 0.5 % SOLN Place 1 drop into both eyes 3 (three) times daily as needed (dry eye).     . LORazepam (ATIVAN) 1 MG tablet Take 1 mg by mouth 2 (two) times daily as needed for anxiety.    . ramipril (ALTACE) 10 MG  capsule Take 1 capsule (10 mg total) by mouth daily. 90 capsule 3  . ranitidine (ZANTAC) 75 MG tablet Take 75 mg by mouth at bedtime.    . Omega-3 Fatty Acids (FISH OIL CONCENTRATE PO) Take 1 capsule by mouth daily.      No current facility-administered medications on file prior to visit.    Review of Systems:  Review of Systems  Constitutional: Negative for fever and chills.  Respiratory: Negative for cough, sputum production, shortness of breath and wheezing.   Cardiovascular: Negative for chest pain and palpitations.  Gastrointestinal: Positive for nausea (mild). Negative for abdominal pain, diarrhea and constipation.  Genitourinary: Positive for dysuria, urgency and frequency. Negative for hematuria and flank pain.  Musculoskeletal: Negative for myalgias.  Skin: Negative for rash.  Neurological: Negative for dizziness and headaches.     Physical Examination: Filed Vitals:   01/23/16 0834 01/23/16 0835  BP: 179/63 178/74  Pulse: 78 80  Temp: 98 F (36.7 C)   Resp: 16    Filed Vitals:   01/23/16 0834  Height: 5' 5.5" (1.664 m)  Weight: 154 lb (69.854 kg)   Body mass index is 25.23 kg/(m^2). Ideal Body Weight: Weight in (lb) to have BMI = 25: 152.2  GEN: WDWN, NAD, Non-toxic, A & O x 3 HEENT: Atraumatic, Normocephalic.  CV: RRR, No M/G/R. No JVD. No thrill. No extra heart sounds. PULM: CTA B, no wheezes, crackles, rhonchi. No retractions. No resp. distress. No accessory muscle use. ABD: S, NT, ND, +BS. No rebound. No HSM. No flank pain.  EXTR: No c/c/e NEURO Normal gait.  PSYCH: Normally interactive. Conversant. Not depressed or anxious appearing.  Calm demeanor.   Assessment and Plan: Cystitis: Pyridium and keflex.  Discussed hydration & returning immediately with any symptoms that worsen.    F/u in 2 weeks for BP recheck.   Signed Gerre Pebbles, MD

## 2016-01-23 NOTE — Addendum Note (Signed)
Addended by: Frutoso Chase A on: 01/23/2016 10:04 AM   Modules accepted: Miquel Dunn

## 2016-01-24 LAB — URINE CULTURE
Colony Count: NO GROWTH
ORGANISM ID, BACTERIA: NO GROWTH

## 2016-01-25 ENCOUNTER — Other Ambulatory Visit: Payer: Self-pay | Admitting: Cardiology

## 2016-01-26 ENCOUNTER — Other Ambulatory Visit: Payer: Self-pay | Admitting: *Deleted

## 2016-01-26 NOTE — Telephone Encounter (Signed)
error 

## 2016-01-30 ENCOUNTER — Other Ambulatory Visit: Payer: Self-pay | Admitting: *Deleted

## 2016-01-30 ENCOUNTER — Ambulatory Visit
Admission: RE | Admit: 2016-01-30 | Discharge: 2016-01-30 | Disposition: A | Discharge status: Home / Self Care | Payer: Medicare Other | Source: Ambulatory Visit | Attending: Hematology and Oncology | Admitting: Hematology and Oncology

## 2016-01-30 DIAGNOSIS — Z9889 Other specified postprocedural states: Secondary | ICD-10-CM

## 2016-01-30 NOTE — Telephone Encounter (Signed)
I also don't think I have seen her.  I saw that she saw Dr. Linna Darner recently. I don't think I can prescribe Ativan unless I have seen her.   Leonie Man, MD

## 2016-01-30 NOTE — Telephone Encounter (Signed)
Patient calling to see if Dr. Ellyn Hack will refill her Ativan for her. She does not have a PCP and she says Dr. Mare Ferrari refilled all of her medications because he was both her PCP and Cardiologist. I let her know most of our cardiologist now only do cardiology and cardiac medications but I would route this message to Dr. Ellyn Hack.  Patient would like a call back from his nurse on whether he will or will not refill the rx.

## 2016-02-02 ENCOUNTER — Encounter: Payer: Self-pay | Admitting: Cardiology

## 2016-02-02 ENCOUNTER — Ambulatory Visit (INDEPENDENT_AMBULATORY_CARE_PROVIDER_SITE_OTHER): Payer: Medicare Other | Admitting: Cardiology

## 2016-02-02 VITALS — BP 160/94 | HR 75 | Ht 65.0 in | Wt 154.4 lb

## 2016-02-02 DIAGNOSIS — I10 Essential (primary) hypertension: Secondary | ICD-10-CM

## 2016-02-02 MED ORDER — LORAZEPAM 1 MG PO TABS: 1.0000 mg | ORAL_TABLET | Freq: Two times a day (BID) | ORAL | Status: DC | PRN

## 2016-02-02 MED ORDER — RAMIPRIL 10 MG PO CAPS: 20.0000 mg | ORAL_CAPSULE | Freq: Every day | ORAL | Status: DC

## 2016-02-02 NOTE — Assessment & Plan Note (Signed)
This is an exacerbation of a chronic problem. No sustained tachycardia

## 2016-02-02 NOTE — Patient Instructions (Signed)
Your physician has recommended you make the following change in your medication: the ramipril has been increased to 20 mg daily. ( 2 pills )  YOU MUST FIND A PRIMARY CARE PHYSICIAN BEFORE YOUR ATIVAN RUNS OUT. WE WILL NOT REFILL AFTER THE PRESCRIPTION THAT HAS BEEN GIVEN TODAY.  Your physician wants you to follow-up in: 6 months or sooner if needed with Dr Ellyn Hack. You will receive a reminder letter in the mail two months in advance. If you don't receive a letter, please call our office to schedule the follow-up appointment.

## 2016-02-02 NOTE — Telephone Encounter (Signed)
I am afraid I only saw her once for nothing related to that.  She can always come in and see if a provider will be willing to give her medications to tide her over until she has a PCP,but she needs to be looking around for a PCP at [facility], or elsewhere.

## 2016-02-02 NOTE — Assessment & Plan Note (Signed)
Her B/P was elevated recently at an Urgent Care (she went for a UTI) and she was instructed to contact her cardiologist for follow up.  B/P by me was [id number] systolic, increase Altace

## 2016-02-02 NOTE — Progress Notes (Signed)
02/02/2016 Lisa Cox   10/10/1946  ID:[id number]  Primary Physician Lamar Blinks, MD Primary Cardiologist: Dr Ellyn Hack (new)  HPI:  70 y/o female followed for years by Dr Mare Ferrari. She has HTN and HLD. Echo in 2012 showed normal LVF with moderate LVH. She has no history of CAD or MI. She is in the office today to have her B/P checked. She is complaining of palpitations, which is not for her, she denies any sustained tachycardia. She denies SOB or chest pain. She says her B/P at home runs "a little high". B/P by me was [id number] systolic today in the office.    Current Outpatient Prescriptions  Medication Sig Dispense Refill  . acetaminophen (TYLENOL) 500 MG tablet Take 1,000 mg by mouth every 6 (six) hours as needed. For cramping.    Marland Kitchen aspirin 81 MG tablet Take 81 mg by mouth daily as needed (blood thinner). Does not take on a daily basis    . atenolol (TENORMIN) 25 MG tablet Take 25 mg by mouth 2 (two) times daily. Or as directed by your doctor    . b complex vitamins tablet Take 1 tablet by mouth daily as needed (supplement).     . carboxymethylcellulose (REFRESH TEARS) 0.5 % SOLN Place 1 drop into both eyes 3 (three) times daily as needed (dry eye).     Marland Kitchen dimenhyDRINATE (DRAMAMINE) 50 MG tablet Take 50 mg by mouth every 8 (eight) hours as needed.    Marland Kitchen LORazepam (ATIVAN) 1 MG tablet Take 1 mg by mouth 2 (two) times daily as needed for anxiety.    . Omega-3 Fatty Acids (FISH OIL CONCENTRATE PO) Take 1 capsule by mouth daily.     . ranitidine (ZANTAC) 75 MG tablet Take 75 mg by mouth at bedtime.    . [DISCONTINUED] ramipril (ALTACE) 10 MG capsule Take 1 capsule (10 mg total) by mouth daily. 90 capsule 3  . phenazopyridine (PYRIDIUM) 95 MG tablet Take 1 tablet (95 mg total) by mouth 3 (three) times daily as needed for pain. (Patient not taking: Reported on 02/02/2016) 10 tablet 0   No current facility-administered medications for this visit.    Allergies  Allergen Reactions  .  Amlodipine     Peripheral edema  . Crestor [Rosuvastatin Calcium]     Leg cramps  . Erythromycin     nausea  . Macrobid [Nitrofurantoin Monohyd Macro] Nausea And Vomiting  . Niacin And Related Itching  . Restoril     Reaction-"nervous"  . Zolpidem Tartrate     Reaction-"nervous"    Social History   Social History  . Marital Status: Divorced    Spouse Name: N/A  . Number of Children: N/A  . Years of Education: N/A   Occupational History  . Not on file.   Social History Main Topics  . Smoking status: Never Smoker   . Smokeless tobacco: Not on file  . Alcohol Use: No  . Drug Use: No  . Sexual Activity: Not Currently   Other Topics Concern  . Not on file   Social History Narrative     Review of Systems: General: negative for chills, fever, night sweats or weight changes.  Cardiovascular: negative for chest pain, dyspnea on exertion, edema, orthopnea, palpitations, paroxysmal nocturnal dyspnea or shortness of breath Dermatological: negative for rash Respiratory: negative for cough or wheezing Urologic: negative for hematuria Abdominal: negative for nausea, vomiting, diarrhea, bright red blood per rectum, melena, or hematemesis Neurologic: negative for visual changes, syncope, or  dizziness All other systems reviewed and are otherwise negative except as noted above.    Blood pressure 160/94, pulse 75, height 5\' 5"  (1.651 m), weight 154 lb 6.4 oz (70.035 kg), SpO2 98 %.  General appearance: alert, cooperative and no distress Neck: no carotid bruit and no JVD Lungs: clear to auscultation bilaterally Heart: regular rate and rhythm Extremities: no edema Neurologic: Grossly normal  EKG NSR  ASSESSMENT AND PLAN:   Palpitations This is an exacerbation of a chronic problem. No sustained tachycardia  Benign hypertensive heart disease without heart failure Her B/P was elevated recently at an Urgent Care (she went for a UTI) and she was instructed to contact her  cardiologist for follow up.  B/P by me was [id number] systolic, increase Altace  History of breast cancer She asked for a refill today. She has been on Ativan PRN Ativan since she was diagnosed with breast cancer.- Dr Mare Ferrari had prescribed it originally, she currently is trying to get established with a PCP   PLAN  I increased her Altace to 20 mg daily. 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 I did refill her Ativan once but told her she'll need a PCP to refill this again. F/U with Dr Ellyn Hack in 6 months.   Kerin Ransom K PA-C 02/02/2016 2:05 PM

## 2016-02-02 NOTE — Assessment & Plan Note (Signed)
She asked for a refill today. She has been on Ativan PRN Ativan since she was diagnosed with breast cancer.- Dr Mare Ferrari had prescribed it originally, she currently is trying to get established with a PCP

## 2016-03-08 ENCOUNTER — Ambulatory Visit: Payer: Medicare Other | Admitting: Hematology and Oncology

## 2016-03-12 ENCOUNTER — Ambulatory Visit (HOSPITAL_BASED_OUTPATIENT_CLINIC_OR_DEPARTMENT_OTHER): Payer: Medicare Other | Admitting: Hematology and Oncology

## 2016-03-12 ENCOUNTER — Encounter: Payer: Self-pay | Admitting: Hematology and Oncology

## 2016-03-12 VITALS — BP 151/64 | HR 78 | Temp 98.9°F | Resp 17 | Wt 154.4 lb

## 2016-03-12 DIAGNOSIS — Z853 Personal history of malignant neoplasm of breast: Secondary | ICD-10-CM

## 2016-03-12 NOTE — Progress Notes (Signed)
Patient Care Team: Darreld Mclean, MD as PCP - General (Family Medicine)  SUMMARY OF ONCOLOGIC HISTORY:   History of breast cancer   11/16/2009 Initial Diagnosis DCIS high-grade with associated microcalcification and necrosis 0.6 cm ER 2%, PR 0%   12/15/2009 Surgery Right breast lumpectomy no residual DCIS   01/25/2010 - 02/20/2010 Radiation Therapy Adjuvant radiation therapy   03/12/2010 - 03/12/2014 Anti-estrogen oral therapy Tamoxifen 20 mg daily    CHIEF COMPLIANT: Follow-up of right breast DCIS  INTERVAL HISTORY: Lisa Cox is a 70 year old with above-mentioned history of DCIS right breast who is here for surveillance examination. She reports no new problems or concerns. Mammograms in April were normal. She had a skin changes in the breast which have healed completely.  REVIEW OF SYSTEMS:   Constitutional: Denies fevers, chills or abnormal weight loss Eyes: Denies blurriness of vision Ears, nose, mouth, throat, and face: Denies mucositis or sore throat Respiratory: Denies cough, dyspnea or wheezes Cardiovascular: Denies palpitation, chest discomfort Gastrointestinal:  Denies nausea, heartburn or change in bowel habits Skin: Denies abnormal skin rashes Lymphatics: Denies new lymphadenopathy or easy bruising Neurological:Denies numbness, tingling or new weaknesses Behavioral/Psych: Mood is stable, no new changes  Extremities: No lower extremity edema Breast:  denies any pain or lumps or nodules in either breasts All other systems were reviewed with the patient and are negative.  I have reviewed the past medical history, past surgical history, social history and family history with the patient and they are unchanged from previous note.  ALLERGIES:  is allergic to amlodipine; crestor; erythromycin; macrobid; niacin and related; restoril; and zolpidem tartrate.  MEDICATIONS:  Current Outpatient Prescriptions  Medication Sig Dispense Refill  . acetaminophen (TYLENOL) 500 MG  tablet Take 1,000 mg by mouth every 6 (six) hours as needed. For cramping.    Marland Kitchen aspirin 81 MG tablet Take 81 mg by mouth daily as needed (blood thinner). Does not take on a daily basis    . atenolol (TENORMIN) 25 MG tablet Take 25 mg by mouth 2 (two) times daily. Or as directed by your doctor    . b complex vitamins tablet Take 1 tablet by mouth daily as needed (supplement).     . carboxymethylcellulose (REFRESH TEARS) 0.5 % SOLN Place 1 drop into both eyes 3 (three) times daily as needed (dry eye).     Marland Kitchen dimenhyDRINATE (DRAMAMINE) 50 MG tablet Take 50 mg by mouth every 8 (eight) hours as needed.    Marland Kitchen LORazepam (ATIVAN) 1 MG tablet Take 1 tablet (1 mg total) by mouth 2 (two) times daily as needed for anxiety. 30 tablet 0  . Omega-3 Fatty Acids (FISH OIL CONCENTRATE PO) Take 1 capsule by mouth daily.     . phenazopyridine (PYRIDIUM) 95 MG tablet Take 1 tablet (95 mg total) by mouth 3 (three) times daily as needed for pain. (Patient not taking: Reported on 02/02/2016) 10 tablet 0  . ramipril (ALTACE) 10 MG capsule Take 2 capsules (20 mg total) by mouth daily. 60 capsule 6  . ranitidine (ZANTAC) 75 MG tablet Take 75 mg by mouth at bedtime.     No current facility-administered medications for this visit.    PHYSICAL EXAMINATION: ECOG PERFORMANCE STATUS: 1 - Symptomatic but completely ambulatory  Filed Vitals:   03/12/16 0904  BP: 151/64  Pulse: 78  Temp: 98.9 F (37.2 C)  Resp: 17   Filed Weights   03/12/16 0904  Weight: 154 lb 6.4 oz (70.035 kg)  GENERAL:alert, no distress and comfortable SKIN: skin color, texture, turgor are normal, no rashes or significant lesions EYES: normal, Conjunctiva are pink and non-injected, sclera clear OROPHARYNX:no exudate, no erythema and lips, buccal mucosa, and tongue normal  NECK: supple, thyroid normal size, non-tender, without nodularity LYMPH:  no palpable lymphadenopathy in the cervical, axillary or inguinal LUNGS: clear to auscultation and  percussion with normal breathing effort HEART: regular rate & rhythm and no murmurs and no lower extremity edema ABDOMEN:abdomen soft, non-tender and normal bowel sounds MUSCULOSKELETAL:no cyanosis of digits and no clubbing  NEURO: alert & oriented x 3 with fluent speech, no focal motor/sensory deficits EXTREMITIES: No lower extremity edema BREAST: No palpable masses or nodules in either right or left breasts. No palpable axillary supraclavicular or infraclavicular adenopathy no breast tenderness or nipple discharge. (exam performed in the presence of a chaperone)  LABORATORY DATA:  I have reviewed the data as listed   Chemistry      Component Value Date/Time   NA 139 12/19/2015 0946   NA 137 10/12/2014 1458   K 4.2 12/19/2015 0946   K 4.1 10/12/2014 1458   CL 102 12/19/2015 0946   CL 103 01/16/2013 1020   CO2 29 12/19/2015 0946   CO2 28 10/12/2014 1458   BUN 11 12/19/2015 0946   BUN 12.5 10/12/2014 1458   CREATININE 0.95* 12/19/2015 0946   CREATININE 0.89 07/15/2015 1521   CREATININE 0.9 10/12/2014 1458      Component Value Date/Time   CALCIUM 9.0 12/19/2015 0946   CALCIUM 8.9 10/12/2014 1458   ALKPHOS 56 12/19/2015 0946   ALKPHOS 42 10/12/2014 1458   AST 20 12/19/2015 0946   AST 18 10/12/2014 1458   ALT 15 12/19/2015 0946   ALT 16 10/12/2014 1458   BILITOT 0.4 12/19/2015 0946   BILITOT 0.27 10/12/2014 1458       Lab Results  Component Value Date   WBC 6.7 12/19/2015   HGB 12.9 12/19/2015   HCT 38.2 12/19/2015   MCV 91.8 12/19/2015   PLT 213 12/19/2015   NEUTROABS 2.5 12/19/2015     ASSESSMENT & PLAN:  History of breast cancer Right breast DCIS diagnosed February 2011 treated with lumpectomy and radiation and is currently on tamoxifen since May 2011 ER 2% PR 0% Tamoxifen from May 2011 to May 2016  Breast cancer surveillance: 1. Breast exam 03/10/2016 no palpable lumps or concerns 2. Mammogram April 2017: Benign  Survivorship:Discussed the importance of  physical exercise in decreasing the likelihood of breast cancer recurrence. Recommended 30 mins daily 6 days a week of either brisk walking or cycling or swimming. Encouraged patient to eat more fruits and vegetables and decrease red meat.   Patient has family history of breast cancer and pancreatic cancer. I offered her genetic counseling through our genetics clinic. Patient will inform us if she wishes to proceed. I believe that she does qualify for genetic testing. I offered her survivorship clinic but she elected to not proceed with that plan. Patient wishes to follow with her gynecologist for annual breast exams and follow-ups. We will see her on an as-needed basis.   No orders of the defined types were placed in this encounter.   The patient has a good understanding of the overall plan. she agrees with it. she will call with any problems that may develop before the next visit here.   Rulon Eisenmenger, MD 03/12/2016

## 2016-03-12 NOTE — Assessment & Plan Note (Signed)
Right breast DCIS diagnosed February 2011 treated with lumpectomy and radiation and is currently on tamoxifen since May 2011 ER 2% PR 0% Tamoxifen from May 2011 to May 2016  Breast cancer surveillance: 1. Breast exam 03/10/2016 no palpable lumps or concerns 2. Mammogram April 2017: Benign  Survivorship:Discussed the importance of physical exercise in decreasing the likelihood of breast cancer recurrence. Recommended 30 mins daily 6 days a week of either brisk walking or cycling or swimming. Encouraged patient to eat more fruits and vegetables and decrease red meat.   Return to clinic in 1 year with survivorship clinic

## 2016-04-06 ENCOUNTER — Telehealth: Payer: Self-pay | Admitting: Cardiology

## 2016-04-06 NOTE — Telephone Encounter (Signed)
Spoke to patient she states she has had this issue for awhile bigemy  pvv'c   Dr Mare Ferrari -informed patient to take extra atenolol  patient states she has taken medication Heart rate 70's , s upper 30's per her machine- RN informed patient That blood pressure machine does not always pick up irregular heart beat. No other symptoms patient has an appointment with primary on Monday. RN instructed to keep appointment, if cardiology is needed may call back

## 2016-04-06 NOTE — Telephone Encounter (Signed)
New Message  Pt call requesting to speak with RN.   Patient c/o Palpitations:  High priority if patient c/o lightheadedness and shortness of breath.  1. How long have you been having palpitations? 6/9  2. Are you currently experiencing lightheadedness and shortness of breath? NO  3. Have you checked your BP and heart rate? (document readings) did not have numbers, but stated it was going up and down  4. Are you experiencing any other symptoms? NO  Please call back to discuss

## 2016-04-09 ENCOUNTER — Ambulatory Visit (INDEPENDENT_AMBULATORY_CARE_PROVIDER_SITE_OTHER): Payer: Medicare Other | Admitting: Internal Medicine

## 2016-04-09 ENCOUNTER — Encounter: Payer: Self-pay | Admitting: Internal Medicine

## 2016-04-09 VITALS — BP 150/80 | HR 72 | Temp 98.2°F | Resp 12 | Ht 65.5 in | Wt 153.0 lb

## 2016-04-09 DIAGNOSIS — I119 Hypertensive heart disease without heart failure: Secondary | ICD-10-CM

## 2016-04-09 DIAGNOSIS — G4700 Insomnia, unspecified: Secondary | ICD-10-CM | POA: Diagnosis not present

## 2016-04-09 DIAGNOSIS — R002 Palpitations: Secondary | ICD-10-CM | POA: Diagnosis not present

## 2016-04-09 DIAGNOSIS — Z853 Personal history of malignant neoplasm of breast: Secondary | ICD-10-CM

## 2016-04-09 MED ORDER — LORAZEPAM 1 MG PO TABS: 1.0000 mg | ORAL_TABLET | Freq: Every day | ORAL | Status: DC

## 2016-04-09 NOTE — Progress Notes (Signed)
   Subjective:    Patient ID: Lisa Cox, female    DOB: [birth year], 70 y.o.   MRN: [id number]  HPI The patient is a new 70 YO female coming in for heart skipping beats. She has had extensive workup with cardiology and goes into bigeminy at times. She has not had flare since last fall until the last 10 days or so. She is having them daily and sometimes makes her feel SOB. She denies chest pains or very fast heart rate. Her BP machine was getting HR 30 but she felt normal at the time and the repeat a few minutes later was in the 80s. She is taking her medicines as before. Denies any infection symptoms or nausea or vomiting or diarrhea. She denies significant sinus problems. She is between cardiologists right now and has visit with her new one (hers retired). Overall this is gradually improving but has never lasted this long before.   PMH, [facility], social history reviewed and updated.   Review of Systems  Constitutional: Negative for fever, chills, activity change, appetite change, fatigue and unexpected weight change.  HENT: Negative.   Eyes: Negative.   Respiratory: Positive for shortness of breath. Negative for cough, chest tightness and wheezing.   Cardiovascular: Positive for palpitations. Negative for chest pain and leg swelling.  Gastrointestinal: Negative for nausea, abdominal pain, diarrhea, constipation and abdominal distention.  Musculoskeletal: Positive for arthralgias. Negative for back pain and joint swelling.  Skin: Negative.   Neurological: Negative.   Psychiatric/Behavioral: Negative.       Objective:   Physical Exam  Constitutional: She is oriented to person, place, and time. She appears well-developed and well-nourished.  HENT:  Head: Normocephalic and atraumatic.  Eyes: EOM are normal.  Neck: Normal range of motion.  Cardiovascular: Normal rate and regular rhythm.   Sounds regular during exam for >1 minute  Pulmonary/Chest: Effort normal and breath sounds normal. No  respiratory distress. She has no wheezes. She has no rales.  Abdominal: Soft. Bowel sounds are normal. She exhibits no distension. There is no tenderness. There is no rebound.  Musculoskeletal: She exhibits no edema.  Neurological: She is alert and oriented to person, place, and time.  Skin: Skin is warm and dry.  Psychiatric: She has a normal mood and affect.   Filed Vitals:   04/09/16 1409  BP: 178/80  Pulse: 72  Temp: 98.2 F (36.8 C)  TempSrc: Oral  Resp: 12  Height: 5' 5.5" (1.664 m)  Weight: 153 lb (69.4 kg)  SpO2: 98%      Assessment & Plan:

## 2016-04-09 NOTE — Patient Instructions (Signed)
We have sent in the refill of the lorazepam today.   We are not checking the blood work today or the medicines.

## 2016-04-09 NOTE — Progress Notes (Signed)
Pre visit review using our clinic review tool, if applicable. No additional management support is needed unless otherwise documented below in the visit note. 

## 2016-04-10 DIAGNOSIS — G4700 Insomnia, unspecified: Secondary | ICD-10-CM | POA: Insufficient documentation

## 2016-04-10 HISTORY — DX: Insomnia, unspecified: G47.00

## 2016-04-10 NOTE — Assessment & Plan Note (Signed)
BP is mildly elevated due to stress of visit today, previously at goal typically. Does not wish to change regimen today and continue on ramipril and atenolol. Previous bad reaction to amlodipine.

## 2016-04-10 NOTE — Assessment & Plan Note (Signed)
Takes 1/2-1 lorazepam for sleeping and we did discuss the risks and potential harms of long term usage of this medication with her age and risk of increased falls, memory problems, and dependence. She feels that this is a QOL issue and wishes to continue with treatment.

## 2016-04-10 NOTE — Assessment & Plan Note (Signed)
Do not hear abnormality on exam today. Recommend she keep her follow up with cardiology. Recent EKG normal in April so the yield today is low and not done. Recent labs checked and normal so no repeat today.

## 2016-04-10 NOTE — Assessment & Plan Note (Signed)
Off tamoxifen now and monitoring yearly.

## 2016-04-19 ENCOUNTER — Encounter: Payer: Self-pay | Admitting: Internal Medicine

## 2016-04-19 ENCOUNTER — Ambulatory Visit (INDEPENDENT_AMBULATORY_CARE_PROVIDER_SITE_OTHER): Payer: Medicare Other | Admitting: Internal Medicine

## 2016-04-19 VITALS — BP 190/102 | HR 78 | Temp 98.9°F | Resp 12 | Ht 65.5 in | Wt 153.0 lb

## 2016-04-19 DIAGNOSIS — R309 Painful micturition, unspecified: Secondary | ICD-10-CM

## 2016-04-19 DIAGNOSIS — N3000 Acute cystitis without hematuria: Secondary | ICD-10-CM | POA: Diagnosis not present

## 2016-04-19 DIAGNOSIS — N390 Urinary tract infection, site not specified: Secondary | ICD-10-CM | POA: Insufficient documentation

## 2016-04-19 LAB — POCT URINALYSIS DIPSTICK
BILIRUBIN UA: NEGATIVE
Glucose, UA: NEGATIVE
KETONES UA: NEGATIVE
Nitrite, UA: POSITIVE
PH UA: 6
PROTEIN UA: NEGATIVE
SPEC GRAV UA: 1.015
Urobilinogen, UA: 1

## 2016-04-19 MED ORDER — SULFAMETHOXAZOLE-TRIMETHOPRIM 800-160 MG PO TABS: 1.0000 | ORAL_TABLET | Freq: Two times a day (BID) | ORAL | Status: DC

## 2016-04-19 NOTE — Progress Notes (Signed)
Pre visit review using our clinic review tool, if applicable. No additional management support is needed unless otherwise documented below in the visit note. 

## 2016-04-19 NOTE — Assessment & Plan Note (Signed)
POC u/a with signs of infection. Appears to be simple cystitis. Rx for bactrim for 5 days.

## 2016-04-19 NOTE — Patient Instructions (Signed)
We have sent in bactrim for the urine infection. Take 1 pill twice a day for 5 days starting today.

## 2016-04-19 NOTE — Progress Notes (Signed)
   Subjective:    Patient ID: Lisa Cox, female    DOB: 11-01-1945, 70 y.o.   MRN: [id number]  HPI The patient is a 70 YO female coming in for dysuria. She was not able to sleep it was so painful last night. Also having some frequency and urgency. Symptoms started with mild burning 5 days ago and she took pyridium which alleviated the symptoms for a couple days but it returned 1 day ago. No fevers or chills. No back pain.   Review of Systems  Constitutional: Positive for appetite change. Negative for fever, chills, activity change, fatigue and unexpected weight change.  Respiratory: Negative.   Cardiovascular: Negative.   Gastrointestinal: Positive for nausea. Negative for vomiting, abdominal pain, diarrhea and abdominal distention.  Genitourinary: Positive for dysuria, urgency and frequency. Negative for hematuria, flank pain, decreased urine volume, vaginal bleeding, difficulty urinating, vaginal pain and pelvic pain.  Musculoskeletal: Negative.       Objective:   Physical Exam  Constitutional: She is oriented to person, place, and time. She appears well-developed and well-nourished.  HENT:  Head: Normocephalic and atraumatic.  Eyes: EOM are normal.  Neck: Normal range of motion.  Cardiovascular: Normal rate and regular rhythm.   Pulmonary/Chest: Effort normal and breath sounds normal. No respiratory distress. She has no wheezes. She has no rales.  Abdominal: Soft. Bowel sounds are normal. She exhibits no distension. There is no tenderness. There is no rebound.  Mild suprapubic tenderness  Musculoskeletal: She exhibits no edema.  Neurological: She is alert and oriented to person, place, and time.  Skin: Skin is warm and dry.  Psychiatric: She has a normal mood and affect.   Filed Vitals:   04/19/16 1531  BP: 190/102  Pulse: 78  Temp: 98.9 F (37.2 C)  TempSrc: Oral  Resp: 12  Height: 5' 5.5" (1.664 m)  Weight: 153 lb (69.4 kg)  SpO2: 96%       Assessment & Plan:

## 2016-05-09 ENCOUNTER — Telehealth: Payer: Self-pay | Admitting: *Deleted

## 2016-05-09 MED ORDER — LORAZEPAM 1 MG PO TABS: 1.0000 mg | ORAL_TABLET | Freq: Every day | ORAL | Status: DC

## 2016-05-09 NOTE — Telephone Encounter (Signed)
Printed and signed.  

## 2016-05-09 NOTE — Telephone Encounter (Signed)
Faxed script back to [facility].../lmb 

## 2016-05-09 NOTE — Telephone Encounter (Signed)
Received fax pt requesting refill on her Lorazepam 1 mg. Last filled 04/09/16...Johny Chess

## 2016-05-24 ENCOUNTER — Ambulatory Visit (INDEPENDENT_AMBULATORY_CARE_PROVIDER_SITE_OTHER): Payer: Medicare Other | Admitting: Cardiology

## 2016-05-24 ENCOUNTER — Encounter: Payer: Self-pay | Admitting: Cardiology

## 2016-05-24 VITALS — BP 158/68 | HR 78 | Ht 65.5 in | Wt 155.6 lb

## 2016-05-24 DIAGNOSIS — E7800 Pure hypercholesterolemia, unspecified: Secondary | ICD-10-CM | POA: Diagnosis not present

## 2016-05-24 DIAGNOSIS — R008 Other abnormalities of heart beat: Secondary | ICD-10-CM

## 2016-05-24 DIAGNOSIS — I119 Hypertensive heart disease without heart failure: Secondary | ICD-10-CM | POA: Diagnosis not present

## 2016-05-24 NOTE — Patient Instructions (Signed)
Your physician wants you to follow-up in: 6 MONTHS WITH DR HARDING You will receive a reminder letter in the mail two months in advance. If you don't receive a letter, please call our office to schedule the follow-up appointment.   If you need a refill on your cardiac medications before your next appointment, please call your pharmacy.  

## 2016-05-24 NOTE — Progress Notes (Signed)
PCP: Hoyt Koch, MD  Clinic Note: Chief Complaint  Patient presents with  . Follow-up    cramping in occasionally. Pt states no other Sx.    HPI: Lisa Cox is a 70 y.o. female with a PMH below who presents today for six-month follow-up for her palpitations and hypercholesterolemia. She had an episode of syncope following a rectal bleed episode in December 2013.Marland Kitchen No further episodes since then. She apparently has a history of having ventricular bigeminy.  Lisa Cox is a former patient of Dr. Warren Danes whom he last saw on 12/19/2015. At that time she was having no active symptoms. She remains active caring for her several grandchildren on a regular basis.  She is on ramipril for her hypertension. Did not tolerate amlodipine due to edema. She has had labile fluctuating blood pressures in the past.   She was seen by Kerin Ransom in April because her blood pressure was elevated during urgent care evaluation for UTI. Her Altace was increased Presumably up to 20 mg, but she is only taking 10.  Recent Hospitalizations: None  Was seen by her PCP in mid June and was troubled by a spell of her palpitations. Denied rapid rate just the regular heartbeat.  She has also been having issues with painful urination.  Studies Reviewed:   48 hour Holter monitor September 2016:  NSR with frequent PVCs, some runs of bigeminy PVCs. Rare interpolated PVC. Rare PAC. No VT. No atrial fibrillation. No bradyarrhythmias. No tachycardia  Echocardiogram December 2012: EF 55-60%. Normal wall motion. No significant valvular lesions.   Interval History: Lisa Cox presents today saying overall she is doing better. Her skipped beats are still present, but pretty well-controlled and not very infrequent.. The main thing she notices occasional leg cramping. She also some mild swelling at the end of the day or the end of a long trip. No further episodes of syncope or near syncope. There was only the  one concerning episode when she had rectal bleed a few years ago, but she states that she ask he passed out after having had a bloody stool and being out of the bathroom. She doesn't remember even going to the bathroom however. No symptoms like that since.  He states that usually her blood pressure at home runs in the 120-130 mmHg range, and that she picked does feel a little bit stressed today meeting her new cardiologist.  No chest pain or shortness of breath with rest or exertion. No PND, orthopnea or edema. No palpitations, lightheadedness, dizziness, weakness or syncope/near syncope. 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 She enjoys gardening. No TIA/amaurosis fugax symptoms. No melena, hematochezia, hematuria, or epstaxis. No claudication.  ROS: A comprehensive was performed. ROS   Past Medical History:  Diagnosis Date  . Anxiety   . Bell's palsy 2009  . Cancer ([place]) Feb.2011   breast,lumpectomy right side followed by radiation  . Depression   . GERD (gastroesophageal reflux disease)   . Hyperlipidemia   . Hypertension   . Palpitations    Frequent PVCs with some bigeminy noted on 48-hour monitor. No arrhythmias.    Past Surgical History:  Procedure Laterality Date  . BREAST LUMPECTOMY  Feb. 2011   right breast,followed by radiation therapy  . EYE SURGERY  1953   skin removal in eye  . TUBAL LIGATION  1978    Prior to Admission medications   Medication Sig Start Date End Date Taking? Authorizing Provider  acetaminophen (TYLENOL) 500 MG tablet Take 1,000 mg by mouth every 6 (six) hours as needed. For cramping.   Yes Historical Provider, MD  aspirin 81 MG tablet Take 81 mg by mouth daily as needed (blood thinner). Does not take on a daily basis   Yes Historical Provider, MD  atenolol (TENORMIN) 25 MG tablet Take 25 mg by mouth 2 (two) times daily. Or as directed by your doctor   Yes Historical Provider, MD  b complex vitamins tablet Take 1  tablet by mouth daily as needed (supplement).    Yes Historical Provider, MD  carboxymethylcellulose (REFRESH TEARS) 0.5 % SOLN Place 1 drop into both eyes 3 (three) times daily as needed (dry eye).    Yes Historical Provider, MD  dimenhyDRINATE (DRAMAMINE) 50 MG tablet Take 50 mg by mouth every 8 (eight) hours as needed. Reported on 04/19/2016   Yes Historical Provider, MD  LORazepam (ATIVAN) 1 MG tablet Take 1 tablet (1 mg total) by mouth at bedtime. 05/09/16  Yes Hoyt Koch, MD  Omega-3 Fatty Acids (FISH OIL CONCENTRATE PO) Take 1 capsule by mouth daily.    Yes Historical Provider, MD  phenazopyridine (PYRIDIUM) 95 MG tablet Take 1 tablet (95 mg total) by mouth 3 (three) times daily as needed for pain. 01/23/16  Yes Gerre Pebbles, MD  ramipril (ALTACE) 10 MG capsule Take 1 capsules (10 mg total) by mouth daily. 02/02/16  Yes Luke K Kilroy, PA-C  ranitidine (ZANTAC) 75 MG tablet Take 75 mg by mouth at bedtime.   Yes Historical Provider, MD  sulfamethoxazole-trimethoprim (BACTRIM DS,SEPTRA DS) 800-160 MG tablet Take 1 tablet by mouth 2 (two) times daily. 04/19/16  Yes Hoyt Koch, MD -- Not on currently    Allergies  Allergen Reactions  . Amlodipine     Peripheral edema  . Crestor [Rosuvastatin Calcium]     Leg cramps  . Erythromycin     nausea  . Macrobid [Nitrofurantoin Monohyd Macro] Nausea And Vomiting  . Niacin And Related Itching  . Restoril     Reaction-"nervous"  . Zolpidem Tartrate     Reaction-"nervous"    Social History   Social History  . Marital status: Divorced    Spouse name: N/A  . Number of children: N/A  . Years of education: N/A   Social History Main Topics  . Smoking status: Never Smoker  . Smokeless tobacco: Never Used  . Alcohol use No  . Drug use: No  . Sexual activity: Not Currently   Other Topics Concern  . None   Social History Narrative  . None   Family History  Problem Relation Age of Onset  . Cancer Father     pancreatic    . Heart disease Father   . Heart attack Father   . Hypertension Sister   . Hypertension Brother   . Stroke Maternal Aunt     Wt Readings from Last 3 Encounters:  05/24/16 155 lb 9.6 oz (70.6 kg)  04/19/16 153 lb (69.4 kg)  04/09/16 153 lb (69.4 kg)    PHYSICAL EXAM BP (!) 158/68   Pulse 78   Ht 5' 5.5" (1.664 m)   Wt 155 lb 9.6 oz (70.6 kg)   BMI 25.50 kg/m  General appearance: alert, cooperative, appears stated age, no distress and Well-nourished, well-groomed. Neck: no adenopathy, no carotid bruit and no JVD Lungs: clear to auscultation bilaterally, normal percussion bilaterally and non-labored Heart: regular rate and rhythm, S1&S2 normal, no murmur, click, rub or  gallop; occasional ectopy. Nondisplaced PMI. Abdomen: soft, non-tender; bowel sounds normal; no masses,  no organomegaly; no HSM or HJR Extremities: extremities normal, atraumatic; no cyanosis, or edema  Pulses: 2+ and symmetric;  Skin: mobility and turgor normal, no edema and no evidence of bleeding or bruising  Neurologic: Mental status: Alert, oriented, thought content appropriate Cranial nerves: normal (II-XII grossly intact) Psych: Normal mood and affect. Pleasant. Answers questions appropriately. A &O 4    Adult ECG Report Not ordered  Other studies Reviewed: Additional studies/ records that were reviewed today include:  Recent Labs:   Lab Results  Component Value Date   CHOL 194 12/19/2015   HDL 41 (L) 12/19/2015   LDLCALC 127 12/19/2015   TRIG 130 12/19/2015   CHOLHDL 4.7 12/19/2015     ASSESSMENT / PLAN: Problem List Items Addressed This Visit    Ventricular bigeminy seen on cardiac monitor - Primary (Chronic)    Relatively stable. On a beta blocker. No anginal symptoms or heart failure symptoms. She supposedly had a stress test on the past I cannot find the results of. If these episodes increase, I may consider a recheck of an ischemic evaluation to exclude CAD. They tend to go away with  exercise which is reassuring.      Hypercholesterolemia (Chronic)    On omega-3 fatty acids, but not on a statin. She did not tolerate even low-dose Crestor because of cramps. Her cholesterol is borderline with a LDL less than 1:30 and HDL greater than 40. For now continue to monitor.      Benign hypertensive heart disease without heart failure (Chronic)    Her blood pressure is high today, which she attributes to white coat syndrome. Based on what she says her home recordings are, currently her pressure medications along, but low threshold for increasing her ACE inhibitor back to 20 mg.       Other Visit Diagnoses   None.     Current medicines are reviewed at length with the patient today. (+/- concerns) none The following changes have been made: none - continue to use when necessary atenolol for worsening episodes of palpitations. Otherwise no change.   Studies Ordered:   No orders of the defined types were placed in this encounter.   follow-up in: 6 MONTHS WITH DR Ellyn Hack   Glenetta Hew, M.D., M.S. Interventional Cardiologist   Pager # [phone] Phone # [phone] [address] [place], [place] [address]

## 2016-05-26 ENCOUNTER — Encounter: Payer: Self-pay | Admitting: Cardiology

## 2016-05-26 DIAGNOSIS — R008 Other abnormalities of heart beat: Secondary | ICD-10-CM | POA: Insufficient documentation

## 2016-05-26 NOTE — Assessment & Plan Note (Signed)
Her blood pressure is high today, which she attributes to white coat syndrome. Based on what she says her home recordings are, currently her pressure medications along, but low threshold for increasing her ACE inhibitor back to 20 mg.

## 2016-05-26 NOTE — Assessment & Plan Note (Signed)
On omega-3 fatty acids, but not on a statin. She did not tolerate even low-dose Crestor because of cramps. Her cholesterol is borderline with a LDL less than 1:30 and HDL greater than 40. For now continue to monitor.

## 2016-05-26 NOTE — Assessment & Plan Note (Signed)
Relatively stable. On a beta blocker. No anginal symptoms or heart failure symptoms. She supposedly had a stress test on the past I cannot find the results of. If these episodes increase, I may consider a recheck of an ischemic evaluation to exclude CAD. They tend to go away with exercise which is reassuring.

## 2016-07-27 ENCOUNTER — Ambulatory Visit: Payer: Medicare Other

## 2016-07-30 ENCOUNTER — Ambulatory Visit (INDEPENDENT_AMBULATORY_CARE_PROVIDER_SITE_OTHER): Payer: PPO

## 2016-07-30 DIAGNOSIS — Z23 Encounter for immunization: Secondary | ICD-10-CM

## 2016-08-15 DIAGNOSIS — L820 Inflamed seborrheic keratosis: Secondary | ICD-10-CM | POA: Diagnosis not present

## 2016-08-15 DIAGNOSIS — D485 Neoplasm of uncertain behavior of skin: Secondary | ICD-10-CM | POA: Diagnosis not present

## 2016-08-15 DIAGNOSIS — D2262 Melanocytic nevi of left upper limb, including shoulder: Secondary | ICD-10-CM | POA: Diagnosis not present

## 2016-08-15 DIAGNOSIS — D1801 Hemangioma of skin and subcutaneous tissue: Secondary | ICD-10-CM | POA: Diagnosis not present

## 2016-08-15 DIAGNOSIS — L821 Other seborrheic keratosis: Secondary | ICD-10-CM | POA: Diagnosis not present

## 2016-09-17 ENCOUNTER — Other Ambulatory Visit: Payer: Self-pay | Admitting: Internal Medicine

## 2016-09-18 NOTE — Telephone Encounter (Signed)
Faxed to pharmacy

## 2016-10-11 ENCOUNTER — Telehealth: Payer: Self-pay | Admitting: Cardiology

## 2016-10-11 NOTE — Telephone Encounter (Signed)
New message      Patient c/o nausea and heart skipping beats.  No chest pain or sob.  Please advise

## 2016-10-11 NOTE — Telephone Encounter (Signed)
Returned call to patient.She stated for the past week she has been having palpitations,nausea,pain across upper back.Stated she" just feels funny."No chest pain.Stated she is not having any palpitations at present.No appointments available today or tomorrow.Appointment scheduled with Kerin Ransom PA 10/16/16 at 8:30 am.Advised to go to ER if needed.Stated she will see her PCP today.

## 2016-10-12 ENCOUNTER — Ambulatory Visit (INDEPENDENT_AMBULATORY_CARE_PROVIDER_SITE_OTHER): Payer: PPO | Admitting: Internal Medicine

## 2016-10-12 ENCOUNTER — Encounter: Payer: Self-pay | Admitting: Internal Medicine

## 2016-10-12 VITALS — BP 126/78 | HR 88 | Resp 20 | Wt 155.0 lb

## 2016-10-12 DIAGNOSIS — J069 Acute upper respiratory infection, unspecified: Secondary | ICD-10-CM | POA: Diagnosis not present

## 2016-10-12 DIAGNOSIS — R002 Palpitations: Secondary | ICD-10-CM

## 2016-10-12 DIAGNOSIS — M546 Pain in thoracic spine: Secondary | ICD-10-CM | POA: Diagnosis not present

## 2016-10-12 MED ORDER — AZITHROMYCIN 250 MG PO TABS
ORAL_TABLET | ORAL | 1 refills | Status: DC
Start: 2016-10-12 — End: 2017-04-02

## 2016-10-12 MED ORDER — HYDROCODONE-HOMATROPINE 5-1.5 MG/5ML PO SYRP: 5.0000 mL | ORAL_SOLUTION | Freq: Four times a day (QID) | ORAL | 0 refills | Status: AC | PRN

## 2016-10-12 NOTE — Assessment & Plan Note (Signed)
C/w benign bigeminy in past, none today, declines ecg or event monitor, cont current tx,  to f/u any worsening symptoms or concerns

## 2016-10-12 NOTE — Progress Notes (Signed)
Pre visit review using our clinic review tool, if applicable. No additional management support is needed unless otherwise documented below in the visit note. 

## 2016-10-12 NOTE — Assessment & Plan Note (Signed)
Mild to mod, for antibx course,  to f/u any worsening symptoms or concerns 

## 2016-10-12 NOTE — Patient Instructions (Addendum)
Please take all new medication as prescribed - the antibiotic, and cough medicine if needed  Please continue all other medications as before, and refills have been done if requested.  Please have the pharmacy call with any other refills you may need.  Please keep your appointments with your specialists as you may have planned     

## 2016-10-12 NOTE — Progress Notes (Signed)
Subjective:    Patient ID: Lisa Cox, female    DOB: Jun 09, 1946, 70 y.o.   MRN: [id number]  HPI   Here with 2-3 days acute onset fever, facial pain, pressure, headache, general weakness and malaise, and greenish d/c, with mild ST and cough, nausea, intermittent few palpitations, and bilat upper thoracic area back pain and stiffness, but pt denies chest pain, wheezing, increased sob or doe, orthopnea, PND, increased LE swelling, dizziness or syncope.  Nausea and palpitations today improved over yesterday - none today  + hx of bigeminy with recommendation to take extra atenolol per cardiology a few yrs ago.  Pt denies polydipsia, polyuria,.  Past Medical History:  Diagnosis Date  . Anxiety   . Bell's palsy 2009  . Cancer ([place]) Feb.2011   breast,lumpectomy right side followed by radiation  . Depression   . GERD (gastroesophageal reflux disease)   . Hyperlipidemia   . Hypertension   . Palpitations    Frequent PVCs with some bigeminy noted on 48-hour monitor. No arrhythmias.   Past Surgical History:  Procedure Laterality Date  . BREAST LUMPECTOMY  Feb. 2011   right breast,followed by radiation therapy  . EYE SURGERY  1953   skin removal in eye  . TUBAL LIGATION  1978    reports that she has never smoked. She has never used smokeless tobacco. She reports that she does not drink alcohol or use drugs. family history includes Cancer in her father; Heart attack in her father; Heart disease in her father; Hypertension in her brother and sister; Stroke in her maternal aunt. Allergies  Allergen Reactions  . Amlodipine     Peripheral edema  . Crestor [Rosuvastatin Calcium]     Leg cramps  . Erythromycin     nausea  . Macrobid [Nitrofurantoin Monohyd Macro] Nausea And Vomiting  . Niacin And Related Itching  . Restoril     Reaction-"nervous"  . Zolpidem Tartrate     Reaction-"nervous"   Current Outpatient Prescriptions on File Prior to Visit  Medication Sig Dispense Refill  .  acetaminophen (TYLENOL) 500 MG tablet Take 1,000 mg by mouth every 6 (six) hours as needed. For cramping.    Marland Kitchen aspirin 81 MG tablet Take 81 mg by mouth daily as needed (blood thinner). Does not take on a daily basis    . atenolol (TENORMIN) 25 MG tablet Take 25 mg by mouth 2 (two) times daily. Or as directed by your doctor    . b complex vitamins tablet Take 1 tablet by mouth daily as needed (supplement).     . carboxymethylcellulose (REFRESH TEARS) 0.5 % SOLN Place 1 drop into both eyes 3 (three) times daily as needed (dry eye).     Marland Kitchen dimenhyDRINATE (DRAMAMINE) 50 MG tablet Take 50 mg by mouth every 8 (eight) hours as needed. Reported on 04/19/2016    . LORazepam (ATIVAN) 1 MG tablet TAKE 1 TABLET AT BEDTIME 30 tablet 2  . Omega-3 Fatty Acids (FISH OIL CONCENTRATE PO) Take 1 capsule by mouth daily.     . phenazopyridine (PYRIDIUM) 95 MG tablet Take 1 tablet (95 mg total) by mouth 3 (three) times daily as needed for pain. 10 tablet 0  . ramipril (ALTACE) 10 MG capsule Take 2 capsules (20 mg total) by mouth daily. 60 capsule 6  . ranitidine (ZANTAC) 75 MG tablet Take 75 mg by mouth at bedtime.    . sulfamethoxazole-trimethoprim (BACTRIM DS,SEPTRA DS) 800-160 MG tablet Take 1 tablet by mouth 2 (two)  times daily. 10 tablet 0   No current facility-administered medications on file prior to visit.    Review of Systems  Constitutional: Negative for unusual diaphoresis or night sweats HENT: Negative for ear swelling or discharge Eyes: Negative for worsening visual haziness  Respiratory: Negative for choking and stridor.   Gastrointestinal: Negative for distension or worsening eructation Genitourinary: Negative for retention or change in urine volume.  Musculoskeletal: Negative for other MSK pain or swelling Skin: Negative for color change and worsening wound Neurological: Negative for tremors and numbness other than noted  Psychiatric/Behavioral: Negative for decreased concentration or agitation  other than above   All other system neg per pt    Objective:   Physical Exam BP 126/78   Pulse 88   Resp 20   Wt 155 lb (70.3 kg)   SpO2 98%   BMI 25.40 kg/m  VS noted, mild ill Constitutional: Pt appears in no apparent distress HENT: Head: NCAT.  Right Ear: External ear normal.  Left Ear: External ear normal.  Bilat tm's with mild erythema.  Max sinus areas tender.  Pharynx with mild erythema, no exudate Eyes: . Pupils are equal, round, and reactive to light. Conjunctivae and EOM are normal Neck: Normal range of motion. Neck supple.  Cardiovascular: Normal rate and regular rhythm.   Pulmonary/Chest: Effort normal and breath sounds without rales or wheezing.  bilat upper thoracic paravertebral tender, mild Spine nontender Neurological: Pt is alert. Not confused , motor grossly intact Skin: Skin is warm. No rash, no LE edema Psychiatric: Pt behavior is normal. No agitation.  No other new exam findings    Assessment & Plan:

## 2016-10-12 NOTE — Assessment & Plan Note (Signed)
C/w msk likely related to recent coughing, for cough med prn, tylenol prn,  to f/u any worsening symptoms or concerns

## 2016-10-16 ENCOUNTER — Ambulatory Visit: Payer: PPO | Admitting: Cardiology

## 2016-10-17 ENCOUNTER — Ambulatory Visit: Payer: PPO | Admitting: Physician Assistant

## 2016-10-19 ENCOUNTER — Other Ambulatory Visit: Payer: Self-pay

## 2016-10-19 MED ORDER — RAMIPRIL 10 MG PO CAPS: 20.0000 mg | ORAL_CAPSULE | Freq: Every day | ORAL | 11 refills | Status: DC

## 2016-11-05 ENCOUNTER — Telehealth: Payer: Self-pay | Admitting: Internal Medicine

## 2016-11-05 NOTE — Telephone Encounter (Signed)
Called patient to schedule awv. Left msg for patient to call office to schedule appt.  °

## 2016-11-14 DIAGNOSIS — L820 Inflamed seborrheic keratosis: Secondary | ICD-10-CM | POA: Diagnosis not present

## 2016-11-30 ENCOUNTER — Encounter: Payer: Self-pay | Admitting: Cardiology

## 2016-11-30 ENCOUNTER — Ambulatory Visit (INDEPENDENT_AMBULATORY_CARE_PROVIDER_SITE_OTHER): Payer: PPO | Admitting: Cardiology

## 2016-11-30 VITALS — BP 152/76 | HR 70 | Ht 65.5 in | Wt 156.5 lb

## 2016-11-30 DIAGNOSIS — R002 Palpitations: Secondary | ICD-10-CM

## 2016-11-30 DIAGNOSIS — I493 Ventricular premature depolarization: Secondary | ICD-10-CM

## 2016-11-30 DIAGNOSIS — I119 Hypertensive heart disease without heart failure: Secondary | ICD-10-CM | POA: Diagnosis not present

## 2016-11-30 DIAGNOSIS — R008 Other abnormalities of heart beat: Secondary | ICD-10-CM | POA: Diagnosis not present

## 2016-11-30 DIAGNOSIS — E7800 Pure hypercholesterolemia, unspecified: Secondary | ICD-10-CM

## 2016-11-30 NOTE — Patient Instructions (Signed)
MAY CONTINUE TO USE ATENOLOL ON AN AS NEEDED BASIS WITH FREQUENT EXTRA HEART BEAT. IF YOU HAVE A ILLNESS WITH EXTRA BEATS - USE ALPRAZOLAM  NO OTHER CHANGES     Your physician wants you to follow-up in [place].You will receive a reminder letter in the mail two months in advance. If you don't receive a letter, please call our office to schedule the follow-up appointment.   If you need a refill on your cardiac medications before your next appointment, please call your pharmacy.

## 2016-11-30 NOTE — Progress Notes (Signed)
PCP: Hoyt Koch, MD  Clinic Note: Chief Complaint  Patient presents with  . Follow-up    Pt states no SX.   Lisa Cox Palpitations    HPI: Lisa Cox is a 71 y.o. female with a PMH below who presents today for six-month follow-up for her palpitations and hypercholesterolemia. She had an episode of syncope following a rectal bleed episode in December 2013.Lisa Cox No further episodes since then. She apparently has a history of having ventricular bigeminy.   Lisa Cox is a former patient of Dr. Warren Danes whom he last saw on 12/19/2015.  I saw her last in August 2017.  Recent Hospitalizations: None  Was seen by her PCP in mid June and was troubled by a spell of her palpitations. Denied rapid rate just the regular heartbeat.  She has also been having issues with painful urination.  Studies Reviewed:   48 hour Holter monitor September 2016:  NSR with frequent PVCs, some runs of bigeminy PVCs. Rare interpolated PVC. Rare PAC. No VT. No atrial fibrillation. No bradyarrhythmias. No tachycardia  Echocardiogram December 2012: EF 55-60%. Normal wall motion. No significant valvular lesions.   Interval History: Lisa Cox presents today saying overall she is doing better. Her skipped beats are still present, but pretty well-controlled and not very infrequent.. She says that she usually notes palpitations increasing when she is dealing with a viral illness or is under a lot of stress. This is when she starts feeling a little bit dyspneic as well associated with it. Oftentimes can also be associated with stress or fatigue. It seems that the Xanax works as well as the additional Atenolol dose for control of palpitations. She is now back to work and overall doing pretty well.  No further episodes of syncope or near syncope  Pressure is elevated here today, but she states at home was using the 180s over 70s.   No chest pain or shortness of breath with rest or exertion.  No PND,  orthopnea or edema.  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 She enjoys gardening. No TIA/amaurosis fugax symptoms. No claudication.  ROS: A comprehensive was performed. Review of Systems  Constitutional: Negative for diaphoresis.  HENT: Negative for congestion and nosebleeds.   Respiratory: Negative for cough and sputum production.   Cardiovascular:       Per history of present illness  Gastrointestinal: Negative for constipation and heartburn.  Genitourinary: Negative for hematuria.  Musculoskeletal: Negative for joint pain and myalgias.  Skin: Negative.   Neurological: Positive for dizziness (When she has a run of palpitations. Otherwise sometimes has vertigo symptoms).  All other systems reviewed and are negative.   Past Medical History:  Diagnosis Date  . Anxiety   . Bell's palsy 2009  . Cancer ([place]) Feb.2011   breast,lumpectomy right side followed by radiation  . Depression   . GERD (gastroesophageal reflux disease)   . Hyperlipidemia   . Hypertension   . Palpitations    Frequent PVCs with some bigeminy noted on 48-hour monitor. No arrhythmias.    Past Surgical History:  Procedure Laterality Date  . BREAST LUMPECTOMY  Feb. 2011   right breast,followed by radiation therapy  . EYE SURGERY  1953   skin removal in eye  . TUBAL LIGATION  1978    Current Meds  Medication Sig  . acetaminophen (TYLENOL) 500 MG tablet Take 1,000 mg by mouth every 6 (six) hours as needed. For cramping.  Lisa Cox aspirin 81 MG  tablet Take 81 mg by mouth daily as needed (blood thinner). Does not take on a daily basis  . atenolol (TENORMIN) 25 MG tablet Take 25 mg by mouth 2 (two) times daily. Or as directed by your doctor  . azithromycin (ZITHROMAX Z-PAK) 250 MG tablet 2 tab by mouth day 1, then 1 per day  . b complex vitamins tablet Take 1 tablet by mouth daily as needed (supplement).   . carboxymethylcellulose (REFRESH TEARS) 0.5 % SOLN Place 1 drop into both eyes 3  (three) times daily as needed (dry eye).   Lisa Cox dimenhyDRINATE (DRAMAMINE) 50 MG tablet Take 50 mg by mouth every 8 (eight) hours as needed. Reported on 04/19/2016  . LORazepam (ATIVAN) 1 MG tablet TAKE 1 TABLET AT BEDTIME  . Omega-3 Fatty Acids (FISH OIL CONCENTRATE PO) Take 1 capsule by mouth daily.   . phenazopyridine (PYRIDIUM) 95 MG tablet Take 1 tablet (95 mg total) by mouth 3 (three) times daily as needed for pain.  . ramipril (ALTACE) 10 MG capsule Take 2 capsules (20 mg total) by mouth daily.  . ranitidine (ZANTAC) 75 MG tablet Take 75 mg by mouth at bedtime.  . sulfamethoxazole-trimethoprim (BACTRIM DS,SEPTRA DS) 800-160 MG tablet Take 1 tablet by mouth 2 (two) times daily.     Allergies  Allergen Reactions  . Amlodipine     Peripheral edema  . Crestor [Rosuvastatin Calcium]     Leg cramps  . Erythromycin     nausea  . Macrobid [Nitrofurantoin Monohyd Macro] Nausea And Vomiting  . Niacin And Related Itching  . Restoril     Reaction-"nervous"  . Zolpidem Tartrate     Reaction-"nervous"    Social History   Social History  . Marital status: Divorced    Spouse name: N/A  . Number of children: N/A  . Years of education: N/A   Social History Main Topics  . Smoking status: Never Smoker  . Smokeless tobacco: Never Used  . Alcohol use No  . Drug use: No  . Sexual activity: Not Currently   Other Topics Concern  . None   Social History Narrative  . None   Family History  Problem Relation Age of Onset  . Cancer Father     pancreatic  . Heart disease Father   . Heart attack Father   . Hypertension Sister   . Hypertension Brother   . Stroke Maternal Aunt     Wt Readings from Last 3 Encounters:  11/30/16 71 kg (156 lb 8 oz)  10/12/16 70.3 kg (155 lb)  05/24/16 70.6 kg (155 lb 9.6 oz)    PHYSICAL EXAM BP (!) 152/76   Pulse 70   Ht 5' 5.5" (1.664 m)   Wt 71 kg (156 lb 8 oz)   BMI 25.65 kg/m  General appearance: alert, cooperative, appears stated age, no  distress and Well-nourished, well-groomed. Neck: no adenopathy, no carotid bruit and no JVD Lungs: clear to auscultation bilaterally, normal percussion bilaterally and non-labored Heart: regular rate and rhythm, S1&S2 normal, no murmur, click, rub or gallop; occasional ectopy. Nondisplaced PMI. Abdomen: soft, non-tender; bowel sounds normal; no masses,  no organomegaly; no HSM or HJR Extremities: extremities normal, atraumatic; no cyanosis, or edema  Pulses: 2+ and symmetric;   Neurologic: Mental status: Alert, oriented, thought content appropriate Psych: Normal mood and affect. Pleasant. Answers questions appropriately. A &O 4    Adult ECG Report Normal sinus rhythm with PVC. Heart rate 70. Otherwise normal axis, intervals and durations Stable  EKG  Other studies Reviewed: Additional studies/ records that were reviewed today include:  Recent Labs:   Lab Results  Component Value Date   CHOL 194 12/19/2015   HDL 41 (L) 12/19/2015   LDLCALC 127 12/19/2015   TRIG 130 12/19/2015   CHOLHDL 4.7 12/19/2015     ASSESSMENT / PLAN: Problem List Items Addressed This Visit    Ventricular bigeminy seen on cardiac monitor (Chronic)    Relatively stable on current dose of beta blocker. Okay to use when necessary dose of additional atenolol. He is only taking it once a day at this point.  Otherwise relatively stable. Relatively normal echo in 2012. No ischemic symptoms.      Hypercholesterolemia (Chronic)    Not currently on statin. Lipids are borderline at this point with lifestyle modification. She is taking fish oil. Will defer to PCP, but is borderline      Benign hypertensive heart disease without heart failure (Chronic)    Blood pressure again is high today during her visits, but she says at home is much better controlled. She is only taking once daily ACE inhibitor and was daily atenolol. If her pressures continue to elevate, would potentially consider adding a medication as I don't  think there is much benefit in increasing Toprol and further.      Relevant Orders   EKG 12-Lead   Palpitations    Well-controlled symptoms standpoint. I still hear some exam, but does not notice them. Continue current dose of beta blocker when necessary dosing.       Other Visit Diagnoses    Frequent PVCs    -  Primary   Relevant Orders   EKG 12-Lead      Current medicines are reviewed at length with the patient today. (+/- concerns) none The following changes have been made: none -  Patient Instructions  MAY CONTINUE TO USE ATENOLOL ON AN AS NEEDED BASIS WITH FREQUENT EXTRA HEART BEAT. IF YOU HAVE A ILLNESS WITH EXTRA BEATS - USE ALPRAZOLAM  NO OTHER CHANGES     Your physician wants you to follow-up in [place].You will receive a reminder letter in the mail two months in advance. If you don't receive a letter, please call our office to schedule the follow-up appointment.   If you need a refill on your cardiac medications before your next appointment, please call your pharmacy.   Studies Ordered:   Orders Placed This Encounter  Procedures  . EKG 12-Lead     Glenetta Hew, M.D., M.S. Interventional Cardiologist   Pager # [phone] Phone # [phone] [address] [place], [place] [address]

## 2016-12-02 ENCOUNTER — Encounter: Payer: Self-pay | Admitting: Cardiology

## 2016-12-02 NOTE — Assessment & Plan Note (Addendum)
Well-controlled symptoms standpoint. I still hear some exam, but does not notice them. Continue current dose of beta blocker when necessary dosing.

## 2016-12-02 NOTE — Assessment & Plan Note (Signed)
Relatively stable on current dose of beta blocker. Okay to use when necessary dose of additional atenolol. He is only taking it once a day at this point.  Otherwise relatively stable. Relatively normal echo in 2012. No ischemic symptoms.

## 2016-12-02 NOTE — Assessment & Plan Note (Signed)
Not currently on statin. Lipids are borderline at this point with lifestyle modification. She is taking fish oil. Will defer to PCP, but is borderline

## 2016-12-02 NOTE — Assessment & Plan Note (Signed)
Blood pressure again is high today during her visits, but she says at home is much better controlled. She is only taking once daily ACE inhibitor and was daily atenolol. If her pressures continue to elevate, would potentially consider adding a medication as I don't think there is much benefit in increasing Toprol and further.

## 2017-01-07 ENCOUNTER — Ambulatory Visit: Payer: PPO

## 2017-01-14 ENCOUNTER — Other Ambulatory Visit: Payer: Self-pay | Admitting: Hematology and Oncology

## 2017-01-14 ENCOUNTER — Other Ambulatory Visit: Payer: Self-pay | Admitting: Internal Medicine

## 2017-01-14 DIAGNOSIS — Z853 Personal history of malignant neoplasm of breast: Secondary | ICD-10-CM

## 2017-01-23 ENCOUNTER — Other Ambulatory Visit: Payer: Self-pay | Admitting: Internal Medicine

## 2017-02-13 ENCOUNTER — Ambulatory Visit
Admission: RE | Admit: 2017-02-13 | Discharge: 2017-02-13 | Disposition: A | Discharge status: Home / Self Care | Payer: PPO | Source: Ambulatory Visit | Attending: Internal Medicine | Admitting: Internal Medicine

## 2017-02-13 DIAGNOSIS — Z853 Personal history of malignant neoplasm of breast: Secondary | ICD-10-CM

## 2017-02-13 DIAGNOSIS — R928 Other abnormal and inconclusive findings on diagnostic imaging of breast: Secondary | ICD-10-CM | POA: Diagnosis not present

## 2017-02-13 HISTORY — DX: Personal history of irradiation: Z92.3

## 2017-02-15 DIAGNOSIS — K210 Gastro-esophageal reflux disease with esophagitis: Secondary | ICD-10-CM | POA: Diagnosis not present

## 2017-02-15 DIAGNOSIS — R1311 Dysphagia, oral phase: Secondary | ICD-10-CM | POA: Diagnosis not present

## 2017-03-28 ENCOUNTER — Telehealth: Payer: Self-pay | Admitting: Cardiology

## 2017-03-28 NOTE — Telephone Encounter (Signed)
Recommend using Propranolol 20 mg PRN q 8 hr for palpitations as opposed to additional Atenolol.  Glenetta Hew, MD

## 2017-03-28 NOTE — Telephone Encounter (Signed)
Returned call to patient-patient reports she is having more frequent "skipping beats" than usual.  Reports at times this causes her to feel extremely fatigued.  Reports increased x 2-3 weeks.   Denies recent illness, CP, dizziness, lightheadedness.  Reports intermittent SOB.  Reports this is occurring everyday.  Reports she takes 25mg  atenolol daily and takes extra 25mg  if needed.  Reports taking one tablet this AM and another 30 minutes ago for palpitations.  Reports HR reading in the 40s but states she doesn't think this is correct as "it is not counting the skipped beats".  Reports discussing with Dr. Ellyn Hack a more "short acting" BB medication that she could take but they never switched her.    Advised to continue atenolol as needed, monitor BP and HR and I would route to Dr. Ellyn Hack for further recommendations.  Appt with Dr. Ellyn Hack 6/12 at 8:20 AM

## 2017-03-28 NOTE — Telephone Encounter (Signed)
Please call,pt says her heart been skipping more lately and she is extremely tired.

## 2017-03-29 MED ORDER — PROPRANOLOL HCL 20 MG PO TABS: 20.0000 mg | ORAL_TABLET | Freq: Three times a day (TID) | ORAL | 6 refills | Status: DC | PRN

## 2017-03-29 NOTE — Telephone Encounter (Signed)
Spoke to patient . Instruction given  To use medication as needed  In addtion to continue to use atenolol daily. Patient verbalized understanding E-sent prescription with 6 refills.

## 2017-04-02 ENCOUNTER — Ambulatory Visit (INDEPENDENT_AMBULATORY_CARE_PROVIDER_SITE_OTHER): Payer: PPO | Admitting: Cardiology

## 2017-04-02 ENCOUNTER — Telehealth: Payer: Self-pay | Admitting: Cardiology

## 2017-04-02 VITALS — BP 138/78 | HR 67 | Ht 60.5 in | Wt 153.8 lb

## 2017-04-02 DIAGNOSIS — R008 Other abnormalities of heart beat: Secondary | ICD-10-CM | POA: Diagnosis not present

## 2017-04-02 DIAGNOSIS — I493 Ventricular premature depolarization: Secondary | ICD-10-CM | POA: Diagnosis not present

## 2017-04-02 DIAGNOSIS — E7800 Pure hypercholesterolemia, unspecified: Secondary | ICD-10-CM | POA: Diagnosis not present

## 2017-04-02 DIAGNOSIS — I119 Hypertensive heart disease without heart failure: Secondary | ICD-10-CM

## 2017-04-02 HISTORY — DX: Ventricular premature depolarization: I49.3

## 2017-04-02 NOTE — Patient Instructions (Addendum)
MEDICATION   Continue with current medications Until you have Exercise Tolerance Test   After test is completed ( if testing is prolong more than 2 weeks go head and start , per Dr Ellyn Hack) - increase Atenolol  50 mg in the morning - take Ramapril  In the bedtime  If you have to use the propanolol for palpation,wait at least 2 hours after taking Atenolol.     Schedule at [place] [address] Your physician has requested that you have an exercise tolerance test. For further information please visit [email or web address]. Please also follow instruction sheet, as given.      Your physician recommends that you schedule a follow-up appointment in [place].

## 2017-04-02 NOTE — Progress Notes (Signed)
PCP: Lisa Koch, MD  Clinic Note: Chief Complaint  Patient presents with  . Follow-up    pt c/o skip heart beat, and nausea, pt denied vomitting     HPI: Lisa Cox is a 71 y.o. female with a PMH below who presents today for four-month follow-up with complaints of palpitations and hypercholesterolemia.  Initial evaluation began in December 2013 setting of a rectal bleeding she had a syncopal episode. At that time she was noted to have ventricular bigeminy.   Lisa Cox is a former patient of Dr. Warren Cox whom he last saw on 12/19/2015.    Lisa Cox was last seen on 11/30/2016 -> I recommended that she continue to use baseline dose of atenolol, and recommended when necessary propranolol 20 mg PRN q8 hr.  The patient also noted that Xanax seemed to work to control palpitations.  Recent Hospitalizations: none  Studies Personally Reviewed - (if available, images/films reviewed: From Epic Chart or Care Everywhere)  N/a  Interval History: Lisa Cox returns today now complaining of more heart skipping and nausea and lightheadedness as well as some intermittent chest discomfort. She called couple days ago with similar complaint and noted that she was taking occasional when necessary dose of atenolol but was starting to feel fatigue associated with this. Apparently there was some concern of bradycardia, so she is therefore referred for follow-up visit.   Assessment which she's been noticing his over last couple weeks that she's been having off and on her heart skipping beats that has made her feel somewhat short of breath. She has had occasional episodes of sharp stabbing discomfort in her chest, but is also started noting headaches and like something is being pulled out of her head. She is not had any syncope or near-syncope associated with this but just has felt somewhat dizzy and fatigued. She has not noticed any rapid heart rates just irregular/skipping beats. No  syncope or near-syncope.  She really has not had any chest tightness or pressure with rest or exertion. No PND, orthopnea or edema. No syncope or near-syncope. No TIA or amaurosis fugax.  No claudication.  ROS: A comprehensive was performed. Review of Systems  Constitutional: Positive for malaise/fatigue.  HENT: Negative for congestion and nosebleeds.   Respiratory: Positive for shortness of breath. Negative for cough and wheezing.   Cardiovascular: Positive for palpitations.  Gastrointestinal: Negative for blood in stool and melena.  Musculoskeletal: Negative for falls and myalgias.  Neurological: Positive for dizziness. Negative for focal weakness.  Endo/Heme/Allergies: Negative for environmental allergies.  Psychiatric/Behavioral: Negative for depression. The patient is nervous/anxious.   All other systems reviewed and are negative.   I have reviewed and (if needed) personally updated the patient's problem list, medications, allergies, past medical and surgical history, social and family history.   Past Medical History:  Diagnosis Date  . Anxiety   . Bell's palsy 2009  . Cancer ([place]) Feb.2011   breast,lumpectomy right side followed by radiation  . Depression   . GERD (gastroesophageal reflux disease)   . Hyperlipidemia   . Hypertension   . Palpitations    Frequent PVCs with some bigeminy noted on 48-hour monitor. No arrhythmias.  . Personal history of radiation therapy     Past Surgical History:  Procedure Laterality Date  . BREAST LUMPECTOMY  Feb. 2011   right breast,followed by radiation therapy  . EYE SURGERY  1953   skin removal in eye  . [place]  48 hour Holter monitor September 2016: NSR with frequent PVCs, some runs of bigeminy PVCs. Rare interpolated PVC. Rare PAC. No VT. No atrial fibrillation. No bradyarrhythmias. No tachycardia   Echocardiogram December 2012: EF 55-60%. Normal wall motion. No significant valvular lesions.  Current Meds    Medication Sig  . acetaminophen (TYLENOL) 500 MG tablet Take 1,000 mg by mouth every 6 (six) hours as needed. For cramping.  Marland Kitchen aspirin 81 MG tablet Take 81 mg by mouth daily as needed (blood thinner). Does not take on a daily basis  . atenolol (TENORMIN) 25 MG tablet Take 25 mg by mouth 2 (two) times daily. Or as directed by your doctor  . b complex vitamins tablet Take 1 tablet by mouth daily as needed (supplement).   . carboxymethylcellulose (REFRESH TEARS) 0.5 % SOLN Place 1 drop into both eyes 3 (three) times daily as needed (dry eye).   Marland Kitchen dimenhyDRINATE (DRAMAMINE) 50 MG tablet Take 50 mg by mouth every 8 (eight) hours as needed. Reported on 04/19/2016  . LORazepam (ATIVAN) 1 MG tablet Take 0.5 mg by mouth daily.  . Omega-3 Fatty Acids (FISH OIL CONCENTRATE PO) Take 1 capsule by mouth daily.   . phenazopyridine (PYRIDIUM) 95 MG tablet Take 1 tablet (95 mg total) by mouth 3 (three) times daily as needed for pain.  Marland Kitchen propranolol (INDERAL) 20 MG tablet Take 1 tablet (20 mg total) by mouth every 8 (eight) hours as needed.  . ramipril (ALTACE) 10 MG capsule Take 2 capsules (20 mg total) by mouth daily.  . ranitidine (ZANTAC) 150 MG tablet Take 150 mg by mouth daily.    Allergies  Allergen Reactions  . Amlodipine     Peripheral edema  . Crestor [Rosuvastatin Calcium]     Leg cramps  . Erythromycin     nausea  . Macrobid [Nitrofurantoin Monohyd Macro] Nausea And Vomiting  . Niacin And Related Itching  . Restoril     Reaction-"nervous"  . Zolpidem Tartrate     Reaction-"nervous"    Social History   Social History  . Marital status: Divorced    Spouse name: N/A  . Number of children: N/A  . Years of education: N/A   Social History Main Topics  . Smoking status: Never Smoker  . Smokeless tobacco: Never Used  . Alcohol use No  . Drug use: No  . Sexual activity: Not Currently   Other Topics Concern  . None   Social History Narrative  . None    family history includes  Cancer in her father; Heart attack in her father; Heart disease in her father; Hypertension in her brother and sister; Stroke in her maternal aunt.  Wt Readings from Last 3 Encounters:  04/02/17 153 lb 12.8 oz (69.8 kg)  11/30/16 156 lb 8 oz (71 kg)  10/12/16 155 lb (70.3 kg)    PHYSICAL EXAM BP 138/78   Pulse 67   Ht 5' 0.5" (1.537 m)   Wt 153 lb 12.8 oz (69.8 kg)   BMI 29.54 kg/m  General appearance: alert, cooperative, appears stated age, no distress. Well-nourished, well-groomed HEENT: [place]/AT, EOMI, MMM, anicteric sclera Neck: no adenopathy, no carotid bruit and no JVD Lungs: clear to auscultation bilaterally, normal percussion bilaterally and non-labored Heart: regular rate and rhythm - with frequent PVCs noted by listening closely, S1 &S2 normal, no murmur, click, rub or gallop; nondisplaced PMI. Occasional ectopy Abdomen: soft, non-tender; bowel sounds normal; no masses,  no organomegaly; no HJR Extremities: extremities normal, atraumatic,  no cyanosis, or edema  Pulses: 2+ and symmetric;  Neurologic: Mental status: Alert & oriented x 3, thought content appropriate; non-focal exam.  Pleasant mood & affect.    Adult ECG Report  Rate: 67 ;  Rhythm: normal sinus rhythm, premature ventricular contractions (PVC) and Cannot rule out anterior mitral age undetermined;   Narrative Interpretation: Otherwise normal EKG  Other studies Reviewed: Additional studies/ records that were reviewed today include:  Recent Labs:  n/a  ASSESSMENT / PLAN: Problem List Items Addressed This Visit    Benign hypertensive heart disease without heart failure (Chronic)    Her blood pressure actually looks pretty good today. With his increasing the beta blocker dosage should help that.      Relevant Orders   EKG 12-Lead (Completed)   EXERCISE TOLERANCE TEST   Frequent unifocal PVCs - Primary (Chronic)    Depending on what the echo shows, may need to consider further evaluations of true arrhythmia is  seen.      Relevant Orders   EKG 12-Lead (Completed)   EXERCISE TOLERANCE TEST   Hypercholesterolemia (Chronic)   Relevant Orders   EKG 12-Lead (Completed)   Ventricular bigeminy seen on cardiac monitor (Chronic)    She still has lots of PVCs on her EKGs and on exam. Unfortunately we really can't increase her beta blocker dose with her resting heart rate being as low as this.   Plan is to order a treadmill stress test in order to evaluate her chronotropic response to exercise. Plan is going to be increased atenolol to 50 mg and have her take ramipril at night. She can still use when necessary propranolol which is short-acting.      Relevant Orders   EKG 12-Lead (Completed)   EXERCISE TOLERANCE TEST      Current medicines are reviewed at length with the patient today. (+/- concerns) none The following changes have been made: single  Patient Instructions  MEDICATION   Continue with current medications Until you have Exercise Tolerance Test   After test is completed ( if testing is prolong more than 2 weeks go head and start , per Dr Ellyn Hack) - increase Atenolol  50 mg in the morning - take Ramapril  In the bedtime  If you have to use the propanolol for palpation,wait at least 2 hours after taking Atenolol.     Schedule at [place] [facility] Your physician has requested that you have an exercise tolerance test. For further information please visit [email or web address]. Please also follow instruction sheet, as given.      Your physician recommends that you schedule a follow-up appointment in [place].    Studies Ordered:   Orders Placed This Encounter  Procedures  . EXERCISE TOLERANCE TEST  . EKG 12-Lead      Glenetta Hew, M.D., M.S. Interventional Cardiologist   Pager # [phone] Phone # [phone] [address] [place], [place] [address]

## 2017-04-04 ENCOUNTER — Encounter: Payer: Self-pay | Admitting: Cardiology

## 2017-04-04 NOTE — Assessment & Plan Note (Signed)
Depending on what the echo shows, may need to consider further evaluations of true arrhythmia is seen.

## 2017-04-04 NOTE — Assessment & Plan Note (Signed)
Her blood pressure actually looks pretty good today. With his increasing the beta blocker dosage should help that.

## 2017-04-04 NOTE — Assessment & Plan Note (Addendum)
She still has lots of PVCs on her EKGs and on exam. Unfortunately we really can't increase her beta blocker dose with her resting heart rate being as low as this.   Plan is to order a treadmill stress test in order to evaluate her chronotropic response to exercise. Plan is going to be increased atenolol to 50 mg and have her take ramipril at night. She can still use when necessary propranolol which is short-acting.

## 2017-04-13 NOTE — Telephone Encounter (Signed)
Error. DH

## 2017-04-19 ENCOUNTER — Encounter: Payer: PPO | Admitting: Internal Medicine

## 2017-04-21 HISTORY — PX: OTHER SURGICAL HISTORY: SHX169

## 2017-04-22 DIAGNOSIS — Z779 Other contact with and (suspected) exposures hazardous to health: Secondary | ICD-10-CM | POA: Diagnosis not present

## 2017-04-22 DIAGNOSIS — Z6825 Body mass index (BMI) 25.0-25.9, adult: Secondary | ICD-10-CM | POA: Diagnosis not present

## 2017-04-22 DIAGNOSIS — Z124 Encounter for screening for malignant neoplasm of cervix: Secondary | ICD-10-CM | POA: Diagnosis not present

## 2017-04-25 ENCOUNTER — Telehealth (HOSPITAL_COMMUNITY): Payer: Self-pay

## 2017-04-25 DIAGNOSIS — H2513 Age-related nuclear cataract, bilateral: Secondary | ICD-10-CM | POA: Diagnosis not present

## 2017-04-25 DIAGNOSIS — H353131 Nonexudative age-related macular degeneration, bilateral, early dry stage: Secondary | ICD-10-CM | POA: Diagnosis not present

## 2017-04-25 DIAGNOSIS — H52203 Unspecified astigmatism, bilateral: Secondary | ICD-10-CM | POA: Diagnosis not present

## 2017-04-25 NOTE — Telephone Encounter (Signed)
Encounter complete. 

## 2017-04-26 ENCOUNTER — Telehealth (HOSPITAL_COMMUNITY): Payer: Self-pay

## 2017-04-26 NOTE — Telephone Encounter (Signed)
Encounter complete. 

## 2017-04-29 LAB — HM PAP SMEAR

## 2017-04-30 ENCOUNTER — Ambulatory Visit (HOSPITAL_COMMUNITY)
Admission: RE | Admit: 2017-04-30 | Discharge: 2017-04-30 | Disposition: A | Discharge status: Home / Self Care | Payer: PPO | Source: Ambulatory Visit | Attending: Cardiovascular Disease | Admitting: Cardiovascular Disease

## 2017-04-30 DIAGNOSIS — I119 Hypertensive heart disease without heart failure: Secondary | ICD-10-CM | POA: Diagnosis not present

## 2017-04-30 DIAGNOSIS — I493 Ventricular premature depolarization: Secondary | ICD-10-CM | POA: Diagnosis not present

## 2017-04-30 DIAGNOSIS — R008 Other abnormalities of heart beat: Secondary | ICD-10-CM

## 2017-04-30 LAB — EXERCISE TOLERANCE TEST
CHL CUP MPHR: 147 {beats}/min
CHL CUP RESTING HR STRESS: 76 {beats}/min
CSEPEW: 9 METS
CSEPPHR: 125 {beats}/min
Exercise duration (min): 7 min
Exercise duration (sec): 19 s
Percent HR: 85 %
RPE: 18

## 2017-05-06 ENCOUNTER — Encounter: Payer: Self-pay | Admitting: Cardiology

## 2017-05-06 ENCOUNTER — Ambulatory Visit (INDEPENDENT_AMBULATORY_CARE_PROVIDER_SITE_OTHER): Payer: PPO | Admitting: Cardiology

## 2017-05-06 VITALS — BP 158/73 | HR 66 | Ht 65.5 in | Wt 156.4 lb

## 2017-05-06 DIAGNOSIS — R002 Palpitations: Secondary | ICD-10-CM | POA: Diagnosis not present

## 2017-05-06 DIAGNOSIS — I119 Hypertensive heart disease without heart failure: Secondary | ICD-10-CM

## 2017-05-06 DIAGNOSIS — R008 Other abnormalities of heart beat: Secondary | ICD-10-CM | POA: Diagnosis not present

## 2017-05-06 DIAGNOSIS — I493 Ventricular premature depolarization: Secondary | ICD-10-CM | POA: Diagnosis not present

## 2017-05-06 NOTE — Patient Instructions (Signed)
NO CHANGE WITH MEDICATION AT PRESENT TIME   CONTINUE TO USE PROPANOLOL AS NEEDED.     MONITOR BLOOD PRESSURE ABOUT EVERY OTHER DAY - DIFFERENT TIMES OF THE DAY  WOULD LIKE SYSTOLIC BLOOD PRESSURE ( TOP NUMBER OF YOUR BLOOD PRESSURE) TO BE BELOW 150. CONTACT OFFICE IF IT REMAINS ABOVE THAT NUMBER.     Your physician wants you to follow-up in [place].You will receive a reminder letter in the mail two months in advance. If you don't receive a letter, please call our office to schedule the follow-up appointment.   If you need a refill on your cardiac medications before your next appointment, please call your pharmacy.

## 2017-05-06 NOTE — Progress Notes (Signed)
PCP: Hoyt Koch, MD  Clinic Note: Chief Complaint  Patient presents with  . Follow-up    Palpitations    HPI: Lisa Cox is a 71 y.o. female who is being seen today for follow-up evaluation of frequent PVCs at the request of Hoyt Koch, *. MARLEIGH KAYLOR is a former patient of Dr. Warren Danes whom he last saw on 12/19/2015.  LAMIAH MARMOL was last seen on June 12 where she was complaining about more heart skipping nausea and lightheadedness and intermittent chest discomfort. She was noticing fatigue with taking additional atenolol. Eyes increased atenolol to 50 mg and have her take the mandible at night. We did a GXT.  Recent Hospitalizations: n/a  Studies Personally Reviewed - (if available, images/films reviewed: From Epic Chart or Care Everywhere)  GXT (off of Beta Blocker)  Blood pressure demonstrated a normal response to exercise.  There was no ST segment deviation noted during stress.  No T wave inversion was noted during stress.   Frequent monomorphic PVCs, likely of RV outflow tract origin, are seen at rest, during exercise and during recovery/  Interval History: Kylina returns today overall doing fairly well. She was excited to see the results of her monitor.  Unfortunately, she went for her GXT having been told to hold her beta blocker as opposed to continuing. Therefore we were not able to really see the effect of the beta blocker on her PVCs. She has RVOT PVCs-do better with a calcium channel blocker it beta blockers not controlled. However she seems to be doing fine with her current dose of atenolol 50 mg daily. Very rarely having to use propranolol when necessary. She is not noticing any prolonged spells of rapid irregular heartbeats.  Using when necessary propranolol may be once or twice (as many as 3 time2) a week. Her sharp stabbing chest discomfort comes and goes, but is not routine restenosis any particular activity. No other  chest pain or shortness of breath with rest or exertion. No PND, orthopnea or edema. No palpitations, lightheadedness, dizziness, weakness or syncope/near syncope. No TIA/amaurosis fugax symptoms.  No claudication.  ROS: A comprehensive was performed. Review of Systems  Constitutional: Negative for malaise/fatigue.  HENT: Negative for congestion and nosebleeds.   Respiratory: Positive for shortness of breath (No change).   Cardiovascular:       Per history of present illness  Gastrointestinal: Negative for abdominal pain, blood in stool, heartburn, melena and nausea.  Genitourinary: Negative for hematuria.  Musculoskeletal: Positive for joint pain. Negative for myalgias.  Neurological: Negative for dizziness.  Endo/Heme/Allergies: Negative for environmental allergies.  Psychiatric/Behavioral: The patient is nervous/anxious.   All other systems reviewed and are negative.  I have reviewed and (if needed) personally updated the patient's problem list, medications, allergies, past medical and surgical history, social and family history.   Past Medical History:  Diagnosis Date  . Anxiety   . Bell's palsy 2009  . Cancer ([place]) Feb.2011   breast,lumpectomy right side followed by radiation  . Depression   . GERD (gastroesophageal reflux disease)   . Hyperlipidemia   . Hypertension   . Palpitations    Frequent PVCs with some bigeminy noted on 48-hour monitor. No arrhythmias.  . Personal history of radiation therapy     Past Surgical History:  Procedure Laterality Date  . BREAST LUMPECTOMY  Feb. 2011   right breast,followed by radiation therapy  . EYE SURGERY  1953   skin removal in eye  .  TUBAL LIGATION  1978    Current Meds  Medication Sig  . acetaminophen (TYLENOL) 500 MG tablet Take 1,000 mg by mouth every 6 (six) hours as needed. For cramping.  Marland Kitchen aspirin 81 MG tablet Take 81 mg by mouth daily as needed (blood thinner). Does not take on a daily basis  . atenolol (TENORMIN)  25 MG tablet Take 25 mg by mouth 2 (two) times daily. Or as directed by your doctor  . b complex vitamins tablet Take 1 tablet by mouth daily as needed (supplement).   . Calcium Citrate-Vitamin D (CALCIUM + D PO) Take 2 tablets by mouth daily.  . carboxymethylcellulose (REFRESH TEARS) 0.5 % SOLN Place 1 drop into both eyes 3 (three) times daily as needed (dry eye).   Marland Kitchen dimenhyDRINATE (DRAMAMINE) 50 MG tablet Take 50 mg by mouth every 8 (eight) hours as needed. Reported on 04/19/2016  . LORazepam (ATIVAN) 1 MG tablet Take 0.5 mg by mouth daily.  . Multiple Vitamins-Minerals (PRESERVISION AREDS 2) CAPS Take 2 capsules by mouth daily.  . Omega-3 Fatty Acids (FISH OIL CONCENTRATE PO) Take 1 capsule by mouth daily.   . phenazopyridine (PYRIDIUM) 95 MG tablet Take 1 tablet (95 mg total) by mouth 3 (three) times daily as needed for pain.  Marland Kitchen propranolol (INDERAL) 20 MG tablet Take 1 tablet (20 mg total) by mouth every 8 (eight) hours as needed.  . ramipril (ALTACE) 10 MG capsule Take 2 capsules (20 mg total) by mouth daily.  . ranitidine (ZANTAC) 150 MG tablet Take 150 mg by mouth daily.    Allergies  Allergen Reactions  . Amlodipine     Peripheral edema  . Crestor [Rosuvastatin Calcium]     Leg cramps  . Erythromycin     nausea  . Macrobid [Nitrofurantoin Monohyd Macro] Nausea And Vomiting  . Niacin And Related Itching  . Restoril     Reaction-"nervous"  . Zolpidem Tartrate     Reaction-"nervous"    Social History   Social History  . Marital status: Divorced    Spouse name: N/A  . Number of children: N/A  . Years of education: N/A   Social History Main Topics  . Smoking status: Never Smoker  . Smokeless tobacco: Never Used  . Alcohol use No  . Drug use: No  . Sexual activity: Not Currently   Other Topics Concern  . None   Social History Narrative  . None    family history includes Cancer in her father; Heart attack in her father; Heart disease in her father; Hypertension  in her brother and sister; Stroke in her maternal aunt.  Wt Readings from Last 3 Encounters:  05/06/17 156 lb 6.4 oz (70.9 kg)  04/02/17 153 lb 12.8 oz (69.8 kg)  11/30/16 156 lb 8 oz (71 kg)    PHYSICAL EXAM BP (!) 158/73   Pulse 66   Ht 5' 5.5" (1.664 m)   Wt 156 lb 6.4 oz (70.9 kg)   SpO2 97%   BMI 25.63 kg/m  General appearance: alert, cooperative, appears stated age, no distress. Well-nourished, well-groomed. Neck: no adenopathy, no carotid bruit and no JVD Lungs: clear to auscultation bilaterally, normal percussion bilaterally and non-labored Heart: RRR with occasional ectopy. Normal S1 and S2. No M/R/G. Nondisplaced PMI Abdomen: soft, non-tender; bowel sounds normal; no masses,  no organomegaly; no HJR Extremities: extremities normal, atraumatic, no cyanosis, or edema  Pulses: 2+ and symmetric;  Neurologic: Mental status: Alert & oriented x 3, thought content appropriate;  non-focal exam.  Pleasant mood & affect.    Adult ECG Report n/a  Other studies Reviewed: Additional studies/ records that were reviewed today include:  Recent Labs:  Managed by PCP Lab Results  Component Value Date   CREATININE 0.95 (H) 12/19/2015   BUN 11 12/19/2015   NA 139 12/19/2015   K 4.2 12/19/2015   CL 102 12/19/2015   CO2 29 12/19/2015   Lab Results  Component Value Date   CHOL 194 12/19/2015   HDL 41 (L) 12/19/2015   LDLCALC 127 12/19/2015   TRIG 130 12/19/2015   CHOLHDL 4.7 12/19/2015     ASSESSMENT / PLAN: Problem List Items Addressed This Visit    Benign hypertensive heart disease without heart failure (Chronic)    Blood pressure looks pretty good today on current dose of ACE inhibitor and beta blocker - she indicates that she has not taken her blood pressure medications yet today..  Last visit, we split up beta blocker an ACE inhibitor dosing times.   No orthostatic dizziness, but not quite as controlled now. Continue to monitor. I've asked that she follow her blood  pressures at home to see if we potentially need increased dosing.      Frequent unifocal PVCs - Primary (Chronic)    PVCs seem to be RVOT in nature. Currently relatively well-controlled with atenolol. If these persist, I may consider switching from atenolol to diltiazem for RVOT PVCs. For now will continue with 50 mg atenolol with when necessary propranolol. Otherwise low threshold to convert to calcium channel blocker.      Palpitations (Chronic)    Controlled. See PVCs      Ventricular bigeminy seen on cardiac monitor (Chronic)    PVCs did not seem to get worse with exertion, and fact they got better and then came back with rest.  Relatively normal chronotropic response, however she was not on beta blocker.         Current medicines are reviewed at length with the patient today. (+/- concerns) n/an/a The following changes have been made: No change  Patient Instructions  NO CHANGE WITH MEDICATION AT PRESENT TIME   CONTINUE TO USE PROPANOLOL AS NEEDED.     MONITOR BLOOD PRESSURE ABOUT EVERY OTHER DAY - DIFFERENT TIMES OF THE DAY  WOULD LIKE SYSTOLIC BLOOD PRESSURE ( TOP NUMBER OF YOUR BLOOD PRESSURE) TO BE BELOW 150. CONTACT OFFICE IF IT REMAINS ABOVE THAT NUMBER.     Your physician wants you to follow-up in [place].You will receive a reminder letter in the mail two months in advance. If you don't receive a letter, please call our office to schedule the follow-up appointment.   If you need a refill on your cardiac medications before your next appointment, please call your pharmacy.    Studies Ordered:   No orders of the defined types were placed in this encounter.     Glenetta Hew, M.D., M.S. Interventional Cardiologist   Pager # [phone] Phone # [phone] [address] [place], [place] [address]

## 2017-05-07 ENCOUNTER — Ambulatory Visit: Payer: PPO | Admitting: Cardiology

## 2017-05-08 ENCOUNTER — Encounter: Payer: Self-pay | Admitting: Cardiology

## 2017-05-08 NOTE — Assessment & Plan Note (Addendum)
Blood pressure looks pretty good today on current dose of ACE inhibitor and beta blocker - she indicates that she has not taken her blood pressure medications yet today..  Last visit, we split up beta blocker an ACE inhibitor dosing times.   No orthostatic dizziness, but not quite as controlled now. Continue to monitor. I've asked that she follow her blood pressures at home to see if we potentially need increased dosing.

## 2017-05-08 NOTE — Assessment & Plan Note (Signed)
PVCs did not seem to get worse with exertion, and fact they got better and then came back with rest.  Relatively normal chronotropic response, however she was not on beta blocker.

## 2017-05-08 NOTE — Assessment & Plan Note (Signed)
Controlled. See PVCs

## 2017-05-08 NOTE — Assessment & Plan Note (Signed)
PVCs seem to be RVOT in nature. Currently relatively well-controlled with atenolol. If these persist, I may consider switching from atenolol to diltiazem for RVOT PVCs. For now will continue with 50 mg atenolol with when necessary propranolol. Otherwise low threshold to convert to calcium channel blocker.

## 2017-05-11 LAB — HM PAP SMEAR

## 2017-05-15 DIAGNOSIS — Z872 Personal history of diseases of the skin and subcutaneous tissue: Secondary | ICD-10-CM | POA: Diagnosis not present

## 2017-05-15 DIAGNOSIS — D225 Melanocytic nevi of trunk: Secondary | ICD-10-CM | POA: Diagnosis not present

## 2017-05-15 DIAGNOSIS — L821 Other seborrheic keratosis: Secondary | ICD-10-CM | POA: Diagnosis not present

## 2017-05-15 DIAGNOSIS — D1801 Hemangioma of skin and subcutaneous tissue: Secondary | ICD-10-CM | POA: Diagnosis not present

## 2017-05-15 DIAGNOSIS — M7130 Other bursal cyst, unspecified site: Secondary | ICD-10-CM | POA: Diagnosis not present

## 2017-05-15 DIAGNOSIS — L814 Other melanin hyperpigmentation: Secondary | ICD-10-CM | POA: Diagnosis not present

## 2017-05-22 ENCOUNTER — Other Ambulatory Visit: Payer: Self-pay | Admitting: Internal Medicine

## 2017-05-22 NOTE — Telephone Encounter (Signed)
Last dispensed 03/29/2017 with 30 tabs for 30 days

## 2017-06-05 ENCOUNTER — Other Ambulatory Visit: Payer: Self-pay

## 2017-06-05 MED ORDER — ATENOLOL 25 MG PO TABS: 25.0000 mg | ORAL_TABLET | Freq: Two times a day (BID) | ORAL | 3 refills | Status: DC

## 2017-06-14 ENCOUNTER — Ambulatory Visit (INDEPENDENT_AMBULATORY_CARE_PROVIDER_SITE_OTHER): Payer: PPO | Admitting: Internal Medicine

## 2017-06-14 ENCOUNTER — Other Ambulatory Visit: Payer: PPO

## 2017-06-14 VITALS — BP 132/84 | HR 94 | Temp 98.4°F | Resp 16 | Wt 154.0 lb

## 2017-06-14 DIAGNOSIS — N3000 Acute cystitis without hematuria: Secondary | ICD-10-CM

## 2017-06-14 DIAGNOSIS — R300 Dysuria: Secondary | ICD-10-CM

## 2017-06-14 LAB — POCT URINALYSIS DIPSTICK
GLUCOSE UA: NEGATIVE
Ketones, UA: NEGATIVE
Nitrite, UA: POSITIVE
Protein, UA: NEGATIVE
SPEC GRAV UA: 1.015 (ref 1.010–1.025)
Urobilinogen, UA: 2 E.U./dL — AB
pH, UA: 6 (ref 5.0–8.0)

## 2017-06-14 MED ORDER — CEPHALEXIN 500 MG PO CAPS: 500.0000 mg | ORAL_CAPSULE | Freq: Three times a day (TID) | ORAL | 0 refills | Status: AC

## 2017-06-14 NOTE — Patient Instructions (Signed)
Please take all new medication as prescribed - the antibiotic  The culture should return in 2-3 days, and we will call if the results show the antibiotic might need to be changed  Please continue all other medications as before, and refills have been done if requested.  Please have the pharmacy call with any other refills you may need.  Please keep your appointments with your specialists as you may have planned

## 2017-06-14 NOTE — Progress Notes (Signed)
Subjective:    Patient ID: Lisa Cox, female    DOB: 06/22/1946, 71 y.o.   MRN: [id number]  HPI   Here with c/o 2-3 days onset dysuria, freq and urgency but Denies urinary symptoms such as flank pain, hematuria or n/v, fever, chills.  Pt denies chest pain, increased sob or doe, wheezing, orthopnea, PND, increased LE swelling, palpitations, dizziness or syncope.   Pt denies polydipsia, polyuria.  Last UTI > 1 yr Past Medical History:  Diagnosis Date  . Anxiety   . Bell's palsy 2009  . Cancer ([place]) Feb.2011   breast,lumpectomy right side followed by radiation  . Depression   . GERD (gastroesophageal reflux disease)   . Hyperlipidemia   . Hypertension   . Palpitations    Frequent PVCs with some bigeminy noted on 48-hour monitor. No arrhythmias.  . Personal history of radiation therapy    Past Surgical History:  Procedure Laterality Date  . BREAST LUMPECTOMY  Feb. 2011   right breast,followed by radiation therapy  . EYE SURGERY  1953   skin removal in eye  . TUBAL LIGATION  1978    reports that she has never smoked. She has never used smokeless tobacco. She reports that she does not drink alcohol or use drugs. family history includes Cancer in her father; Heart attack in her father; Heart disease in her father; Hypertension in her brother and sister; Stroke in her maternal aunt. Allergies  Allergen Reactions  . Amlodipine     Peripheral edema  . Crestor [Rosuvastatin Calcium]     Leg cramps  . Erythromycin     nausea  . Macrobid [Nitrofurantoin Monohyd Macro] Nausea And Vomiting  . Niacin And Related Itching  . Restoril     Reaction-"nervous"  . Zolpidem Tartrate     Reaction-"nervous"   Current Outpatient Prescriptions on File Prior to Visit  Medication Sig Dispense Refill  . acetaminophen (TYLENOL) 500 MG tablet Take 1,000 mg by mouth every 6 (six) hours as needed. For cramping.    Marland Kitchen aspirin 81 MG tablet Take 81 mg by mouth daily as needed (blood thinner). Does  not take on a daily basis    . atenolol (TENORMIN) 25 MG tablet Take 1 tablet (25 mg total) by mouth 2 (two) times daily. Or as directed by your doctor 180 tablet 3  . b complex vitamins tablet Take 1 tablet by mouth daily as needed (supplement).     . Calcium Citrate-Vitamin D (CALCIUM + D PO) Take 2 tablets by mouth daily.    . carboxymethylcellulose (REFRESH TEARS) 0.5 % SOLN Place 1 drop into both eyes 3 (three) times daily as needed (dry eye).     Marland Kitchen dimenhyDRINATE (DRAMAMINE) 50 MG tablet Take 50 mg by mouth every 8 (eight) hours as needed. Reported on 04/19/2016    . LORazepam (ATIVAN) 1 MG tablet TAKE 1 TABLET BY MOUTH AT BEDTIME 30 tablet 0  . Multiple Vitamins-Minerals (PRESERVISION AREDS 2) CAPS Take 2 capsules by mouth daily.    . Omega-3 Fatty Acids (FISH OIL CONCENTRATE PO) Take 1 capsule by mouth daily.     . phenazopyridine (PYRIDIUM) 95 MG tablet Take 1 tablet (95 mg total) by mouth 3 (three) times daily as needed for pain. 10 tablet 0  . propranolol (INDERAL) 20 MG tablet Take 1 tablet (20 mg total) by mouth every 8 (eight) hours as needed. 45 tablet 6  . ramipril (ALTACE) 10 MG capsule Take 2 capsules (20 mg total) by  mouth daily. 60 capsule 11  . ranitidine (ZANTAC) 150 MG tablet Take 150 mg by mouth daily.     No current facility-administered medications on file prior to visit.    Review of Systems All other system neg per pt    Objective:   Physical Exam BP 132/84   Pulse 94   Temp 98.4 F (36.9 C) (Oral)   Resp 16   Wt 154 lb (69.9 kg)   SpO2 97%   BMI 25.24 kg/m  VS noted,  Constitutional: Pt appears in NAD HENT: Head: NCAT.  Right Ear: External ear normal.  Left Ear: External ear normal.  Eyes: . Pupils are equal, round, and reactive to light. Conjunctivae and EOM are normal Nose: without d/c or deformity Neck: Neck supple. Gross normal ROM Cardiovascular: Normal rate and regular rhythm.   Pulmonary/Chest: Effort normal and breath sounds without rales or  wheezing.  Abd:  Soft,  ND, + BS, no organomegaly with mild low mid abd tender, no flank tedner Neurological: Pt is alert. At baseline orientation, motor grossly intact Skin: Skin is warm. No rashes, other new lesions, no LE edema Psychiatric: Pt behavior is normal without agitation  No other exam findings  POCT urinalysis dipstick  Order: [id number]  Status:  Final result Visible to patient:  No (Not Released) Dx:  Dysuria    Ref Range & Units 16:32 (06/14/17) 74yr ago (04/19/16) 36yr ago (01/23/16)   Color, UA  orange  orange  yellow    Clarity, UA  cloudy  cloudy  cloudy     Glucose, UA  neg  neg  negative    Bilirubin, UA  1+  neg  negative    Ketones, UA  neg  neg     Spec Grav, UA 1.010 - 1.025 1.015  1.015R  1.015R    Blood, UA  10 Ery/ uL  1+  trace-lysed     pH, UA 5.0 - 8.0 6.0  6.0R  7.0R    Protein, UA  neg  neg  negative    Urobilinogen, UA 0.2 or 1.0 E.U./dL 2.0   1.0R  0.2R    Nitrite, UA  pos  pos  Positive     Leukocytes, UA Negative Large (3+)   moderate (2+)   Trace     Ketones, POC UA               Assessment & Plan:

## 2017-06-14 NOTE — Assessment & Plan Note (Signed)
Likely with typical symptoms and abnormal Udip; for empiric antibx, urine culture and further tx pending results

## 2017-06-17 LAB — URINE CULTURE

## 2017-07-08 ENCOUNTER — Encounter: Payer: Self-pay | Admitting: Internal Medicine

## 2017-07-08 ENCOUNTER — Other Ambulatory Visit: Payer: PPO

## 2017-07-08 ENCOUNTER — Ambulatory Visit (INDEPENDENT_AMBULATORY_CARE_PROVIDER_SITE_OTHER): Payer: PPO | Admitting: Internal Medicine

## 2017-07-08 VITALS — BP 138/90 | HR 65 | Temp 97.9°F | Ht 65.5 in | Wt 156.0 lb

## 2017-07-08 DIAGNOSIS — R300 Dysuria: Secondary | ICD-10-CM

## 2017-07-08 DIAGNOSIS — N3000 Acute cystitis without hematuria: Secondary | ICD-10-CM

## 2017-07-08 LAB — POCT URINALYSIS DIPSTICK
Blood, UA: 10
Glucose, UA: NEGATIVE
KETONES UA: NEGATIVE
Nitrite, UA: POSITIVE
PH UA: 6 (ref 5.0–8.0)
PROTEIN UA: NEGATIVE
Spec Grav, UA: 1.015 (ref 1.010–1.025)
UROBILINOGEN UA: 1 U/dL

## 2017-07-08 MED ORDER — SULFAMETHOXAZOLE-TRIMETHOPRIM 800-160 MG PO TABS: 1.0000 | ORAL_TABLET | Freq: Two times a day (BID) | ORAL | 0 refills | Status: DC

## 2017-07-08 NOTE — Progress Notes (Signed)
Abstracted and sent to scan  

## 2017-07-08 NOTE — Progress Notes (Signed)
   Subjective:    Patient ID: Lisa Cox, female    DOB: 29-Sep-1946, 71 y.o.   MRN: [id number]  HPI The patient is a 71 YO female coming in for possible UTI with frequency and burning. Had recent UTI about 3 weeks ago treated successfully with keflex. Symptoms returned about 2-3 days ago. She denies fevers or chills. No abdominal or back pain. Is worsening. Has tried increasing water intake without relief. Overall worsening.   Review of Systems  Constitutional: Negative for activity change, appetite change, fatigue, fever and unexpected weight change.  Respiratory: Negative.   Cardiovascular: Negative.   Gastrointestinal: Negative.   Genitourinary: Positive for dysuria and frequency. Negative for decreased urine volume, flank pain, pelvic pain, urgency and vaginal bleeding.  Skin: Negative.   Neurological: Negative.       Objective:   Physical Exam  Constitutional: She is oriented to person, place, and time. She appears well-developed and well-nourished.  HENT:  Head: Normocephalic and atraumatic.  Eyes: EOM are normal.  Neck: Normal range of motion.  Cardiovascular: Normal rate and regular rhythm.   Pulmonary/Chest: Effort normal.  Abdominal: Soft. Bowel sounds are normal. She exhibits no distension. There is no tenderness. There is no rebound.  Musculoskeletal: She exhibits no edema.  Neurological: She is alert and oriented to person, place, and time.  Skin: Skin is warm and dry.   Vitals:   07/08/17 1602  BP: 138/90  Pulse: 65  Temp: 97.9 F (36.6 C)  TempSrc: Oral  SpO2: 96%  Weight: 156 lb (70.8 kg)  Height: 5' 5.5" (1.664 m)      Assessment & Plan:

## 2017-07-08 NOTE — Patient Instructions (Signed)
We have sent in the bactrim for the urine infection. Take 1 pill twice a day until gone.

## 2017-07-09 NOTE — Assessment & Plan Note (Signed)
U/A in the office positive for signs of infection. Treating with bactrim. Sending for culture to ensure no resistance.

## 2017-07-10 LAB — URINE CULTURE
MICRO NUMBER:: 81023948
SPECIMEN QUALITY: ADEQUATE

## 2017-07-12 ENCOUNTER — Ambulatory Visit (INDEPENDENT_AMBULATORY_CARE_PROVIDER_SITE_OTHER): Payer: PPO | Admitting: Internal Medicine

## 2017-07-12 ENCOUNTER — Encounter: Payer: Self-pay | Admitting: Internal Medicine

## 2017-07-12 ENCOUNTER — Other Ambulatory Visit (INDEPENDENT_AMBULATORY_CARE_PROVIDER_SITE_OTHER): Payer: PPO

## 2017-07-12 VITALS — BP 120/70 | HR 64 | Temp 98.6°F | Ht 65.5 in | Wt 155.0 lb

## 2017-07-12 DIAGNOSIS — G4700 Insomnia, unspecified: Secondary | ICD-10-CM | POA: Diagnosis not present

## 2017-07-12 DIAGNOSIS — Z853 Personal history of malignant neoplasm of breast: Secondary | ICD-10-CM

## 2017-07-12 DIAGNOSIS — I10 Essential (primary) hypertension: Secondary | ICD-10-CM

## 2017-07-12 DIAGNOSIS — Z0000 Encounter for general adult medical examination without abnormal findings: Secondary | ICD-10-CM

## 2017-07-12 DIAGNOSIS — E785 Hyperlipidemia, unspecified: Secondary | ICD-10-CM

## 2017-07-12 DIAGNOSIS — Z23 Encounter for immunization: Secondary | ICD-10-CM | POA: Diagnosis not present

## 2017-07-12 DIAGNOSIS — E7800 Pure hypercholesterolemia, unspecified: Secondary | ICD-10-CM

## 2017-07-12 HISTORY — DX: Encounter for general adult medical examination without abnormal findings: Z00.00

## 2017-07-12 LAB — LIPID PANEL
CHOLESTEROL: 186 mg/dL (ref 0–200)
HDL: 39.1 mg/dL (ref >39.00)
LDL CALC: 122 mg/dL — AB (ref 0–99)
NonHDL: 146.9
Total CHOL/HDL Ratio: 5
Triglycerides: 127 mg/dL (ref 0.0–149.0)
VLDL: 25.4 mg/dL (ref 0.0–40.0)

## 2017-07-12 LAB — COMPREHENSIVE METABOLIC PANEL
ALBUMIN: 4.1 g/dL (ref 3.5–5.2)
ALT: 19 U/L (ref 0–35)
AST: 20 U/L (ref 0–37)
Alkaline Phosphatase: 67 U/L (ref 39–117)
BUN: 16 mg/dL (ref 6–23)
CHLORIDE: 101 meq/L (ref 96–112)
CO2: 26 mEq/L (ref 19–32)
CREATININE: 1.19 mg/dL (ref 0.40–1.20)
Calcium: 9.3 mg/dL (ref 8.4–10.5)
GFR: 47.19 mL/min — ABNORMAL LOW (ref >60.00)
Glucose, Bld: 85 mg/dL (ref 70–99)
Potassium: 4.6 mEq/L (ref 3.5–5.1)
SODIUM: 134 meq/L — AB (ref 135–145)
Total Bilirubin: 0.6 mg/dL (ref 0.2–1.2)
Total Protein: 6.6 g/dL (ref 6.0–8.3)

## 2017-07-12 LAB — CBC
HEMATOCRIT: 39.5 % (ref 36.0–46.0)
Hemoglobin: 13.4 g/dL (ref 12.0–15.0)
MCHC: 34 g/dL (ref 30.0–36.0)
MCV: 92.6 fl (ref 78.0–100.0)
Platelets: 177 10*3/uL (ref 150.0–400.0)
RBC: 4.26 Mil/uL (ref 3.87–5.11)
RDW: 13.7 % (ref 11.5–15.5)
WBC: 6.8 10*3/uL (ref 4.0–10.5)

## 2017-07-12 NOTE — Assessment & Plan Note (Signed)
Up to date on mammogram and no signs of recurrence.

## 2017-07-12 NOTE — Assessment & Plan Note (Signed)
Checking lipid panel and adjust as needed for LDL <130. Not on meds currently.

## 2017-07-12 NOTE — Assessment & Plan Note (Signed)
BP elevated without therapy (initially started for her ventricular bigeminy) of atenolol and propranolol and ramipril. Checking CMP and adjust as needed. BP at goal today.

## 2017-07-12 NOTE — Assessment & Plan Note (Signed)
Given flu and pneumonia 23 at visit today. Counseled about shingrix and tdap and she declines today. Reminded about next pneumonia next year. Counseled about sun safety and mole surveillance. Colonoscopy and mammogram up to date. Pap smear aged out. Given 10 year screening recommendations.

## 2017-07-12 NOTE — Progress Notes (Signed)
   Subjective:    Patient ID: Lisa Cox, female    DOB: 20-Apr-1946, 71 y.o.   MRN: [id number]  HPI Here for medicare wellness and follow up, no new complaints. Please see A/P for status and treatment of chronic medical problems.   HPI #2: Here for follow up of her insomnia (taking lorazepam nightly, denies side effects, able to fall asleep and stay asleep to her satisfaction, feels rested during the day, no falls), and her cholesterol (previously on statin, not no meds now, no lipid panel in about 1.5 years), and her blood pressure (taking atenolol and ramipril, takes propranolol prn for palpitations, denies headaches or chest pains or SOB, denies missing doses). No new concerns. About to finish antibiotics for UTI.   Diet: average Physical activity: sedentary Depression/mood screen: negative Hearing: intact to whispered voice Visual acuity: grossly norma with lensl, performs annual eye exam  ADLs: capable Fall risk: none Home safety: good Cognitive evaluation: intact to orientation, naming, recall and repetition EOL planning: adv directives discussed  I have personally reviewed and have noted 1. The patient's medical and social history - reviewed today no changes 2. Their use of alcohol, tobacco or illicit drugs 3. Their current medications and supplements 4. The patient's functional ability including ADL's, fall risks, home safety risks and hearing or visual impairment. 5. Diet and physical activities 6. Evidence for depression or mood disorders 7. Care team reviewed and updated (available in snapshot)  Review of Systems  Constitutional: Negative.   HENT: Negative.   Eyes: Negative.   Respiratory: Negative for cough, chest tightness and shortness of breath.   Cardiovascular: Positive for palpitations. Negative for chest pain and leg swelling.       Stable  Gastrointestinal: Negative for abdominal distention, abdominal pain, constipation, diarrhea, nausea and vomiting.    Musculoskeletal: Negative.   Skin: Negative.   Neurological: Negative.   Psychiatric/Behavioral: Negative.       Objective:   Physical Exam  Constitutional: She is oriented to person, place, and time. She appears well-developed and well-nourished.  HENT:  Head: Normocephalic and atraumatic.  Eyes: EOM are normal.  Neck: Normal range of motion.  Cardiovascular: Normal rate and regular rhythm.   Carotids without bruit bilaterally  Pulmonary/Chest: Effort normal and breath sounds normal. No respiratory distress. She has no wheezes. She has no rales.  Abdominal: Soft. Bowel sounds are normal. She exhibits no distension. There is no tenderness. There is no rebound.  Musculoskeletal: She exhibits no edema.  Neurological: She is alert and oriented to person, place, and time. Coordination normal.  Skin: Skin is warm and dry.  Psychiatric: She has a normal mood and affect.   Vitals:   07/12/17 0814  BP: 120/70  Pulse: 64  Temp: 98.6 F (37 C)  TempSrc: Oral  SpO2: 99%  Weight: 155 lb (70.3 kg)  Height: 5' 5.5" (1.664 m)      Assessment & Plan:  Flu shot and pneumonia 23 given at visit

## 2017-07-12 NOTE — Assessment & Plan Note (Signed)
Taking lorazepam nightly and reminded her about the long term risk of addiction, falls, and memory changes. She is willing to accept risk as her sleep improves her QOL.

## 2017-07-12 NOTE — Addendum Note (Signed)
Addended by: Raford Pitcher R on: 07/12/2017 08:54 AM   Modules accepted: Orders

## 2017-07-12 NOTE — Patient Instructions (Signed)
We will check the labs today and have given you the flu shot and the pneumonia shot.   Health Maintenance, Female Adopting a healthy lifestyle and getting preventive care can go a long way to promote health and wellness. Talk with your health care provider about what schedule of regular examinations is right for you. This is a good chance for you to check in with your provider about disease prevention and staying healthy. In between checkups, there are plenty of things you can do on your own. Experts have done a lot of research about which lifestyle changes and preventive measures are most likely to keep you healthy. Ask your health care provider for more information. Weight and diet Eat a healthy diet  Be sure to include plenty of vegetables, fruits, low-fat dairy products, and lean protein.  Do not eat a lot of foods high in solid fats, added sugars, or salt.  Get regular exercise. This is one of the most important things you can do for your health. ? Most adults should exercise for at least 150 minutes each week. The exercise should increase your heart rate and make you sweat (moderate-intensity exercise). ? Most adults should also do strengthening exercises at least twice a week. This is in addition to the moderate-intensity exercise.  Maintain a healthy weight  Body mass index (BMI) is a measurement that can be used to identify possible weight problems. It estimates body fat based on height and weight. Your health care provider can help determine your BMI and help you achieve or maintain a healthy weight.  For females 17 years of age and older: ? A BMI below 18.5 is considered underweight. ? A BMI of 18.5 to 24.9 is normal. ? A BMI of 25 to 29.9 is considered overweight. ? A BMI of 30 and above is considered obese.  Watch levels of cholesterol and blood lipids  You should start having your blood tested for lipids and cholesterol at 73 years of age, then have this test every 5  years.  You may need to have your cholesterol levels checked more often if: ? Your lipid or cholesterol levels are high. ? You are older than 73 years of age. ? You are at high risk for heart disease.  Cancer screening Lung Cancer  Lung cancer screening is recommended for adults 11-33 years old who are at high risk for lung cancer because of a history of smoking.  A yearly low-dose CT scan of the lungs is recommended for people who: ? Currently smoke. ? Have quit within the past 15 years. ? Have at least a 30-pack-year history of smoking. A pack year is smoking an average of one pack of cigarettes a day for 1 year.  Yearly screening should continue until it has been 15 years since you quit.  Yearly screening should stop if you develop a health problem that would prevent you from having lung cancer treatment.  Breast Cancer  Practice breast self-awareness. This means understanding how your breasts normally appear and feel.  It also means doing regular breast self-exams. Let your health care provider know about any changes, no matter how small.  If you are in your 20s or 30s, you should have a clinical breast exam (CBE) by a health care provider every 1-3 years as part of a regular health exam.  If you are 65 or older, have a CBE every year. Also consider having a breast X-ray (mammogram) every year.  If you have a family history  of breast cancer, talk to your health care provider about genetic screening.  If you are at high risk for breast cancer, talk to your health care provider about having an MRI and a mammogram every year.  Breast cancer gene (BRCA) assessment is recommended for women who have family members with BRCA-related cancers. BRCA-related cancers include: ? Breast. ? Ovarian. ? Tubal. ? Peritoneal cancers.  Results of the assessment will determine the need for genetic counseling and BRCA1 and BRCA2 testing.  Cervical Cancer Your health care provider may  recommend that you be screened regularly for cancer of the pelvic organs (ovaries, uterus, and vagina). This screening involves a pelvic examination, including checking for microscopic changes to the surface of your cervix (Pap test). You may be encouraged to have this screening done every 3 years, beginning at age 21.  For women ages 30-65, health care providers may recommend pelvic exams and Pap testing every 3 years, or they may recommend the Pap and pelvic exam, combined with testing for human papilloma virus (HPV), every 5 years. Some types of HPV increase your risk of cervical cancer. Testing for HPV may also be done on women of any age with unclear Pap test results.  Other health care providers may not recommend any screening for nonpregnant women who are considered low risk for pelvic cancer and who do not have symptoms. Ask your health care provider if a screening pelvic exam is right for you.  If you have had past treatment for cervical cancer or a condition that could lead to cancer, you need Pap tests and screening for cancer for at least 20 years after your treatment. If Pap tests have been discontinued, your risk factors (such as having a new sexual partner) need to be reassessed to determine if screening should resume. Some women have medical problems that increase the chance of getting cervical cancer. In these cases, your health care provider may recommend more frequent screening and Pap tests.  Colorectal Cancer  This type of cancer can be detected and often prevented.  Routine colorectal cancer screening usually begins at 73 years of age and continues through 73 years of age.  Your health care provider may recommend screening at an earlier age if you have risk factors for colon cancer.  Your health care provider may also recommend using home test kits to check for hidden blood in the stool.  A small camera at the end of a tube can be used to examine your colon directly  (sigmoidoscopy or colonoscopy). This is done to check for the earliest forms of colorectal cancer.  Routine screening usually begins at age 50.  Direct examination of the colon should be repeated every 5-10 years through 73 years of age. However, you may need to be screened more often if early forms of precancerous polyps or small growths are found.  Skin Cancer  Check your skin from head to toe regularly.  Tell your health care provider about any new moles or changes in moles, especially if there is a change in a mole's shape or color.  Also tell your health care provider if you have a mole that is larger than the size of a pencil eraser.  Always use sunscreen. Apply sunscreen liberally and repeatedly throughout the day.  Protect yourself by wearing long sleeves, pants, a wide-brimmed hat, and sunglasses whenever you are outside.  Heart disease, diabetes, and high blood pressure  High blood pressure causes heart disease and increases the risk of stroke. High   blood pressure is more likely to develop in: ? People who have blood pressure in the high end of the normal range (130-139/85-89 mm Hg). ? People who are overweight or obese. ? People who are African American.  If you are 18-39 years of age, have your blood pressure checked every 3-5 years. If you are 40 years of age or older, have your blood pressure checked every year. You should have your blood pressure measured twice-once when you are at a hospital or clinic, and once when you are not at a hospital or clinic. Record the average of the two measurements. To check your blood pressure when you are not at a hospital or clinic, you can use: ? An automated blood pressure machine at a pharmacy. ? A home blood pressure monitor.  If you are between 55 years and 79 years old, ask your health care provider if you should take aspirin to prevent strokes.  Have regular diabetes screenings. This involves taking a blood sample to check your  fasting blood sugar level. ? If you are at a normal weight and have a low risk for diabetes, have this test once every three years after 73 years of age. ? If you are overweight and have a high risk for diabetes, consider being tested at a younger age or more often. Preventing infection Hepatitis B  If you have a higher risk for hepatitis B, you should be screened for this virus. You are considered at high risk for hepatitis B if: ? You were born in a country where hepatitis B is common. Ask your health care provider which countries are considered high risk. ? Your parents were born in a high-risk country, and you have not been immunized against hepatitis B (hepatitis B vaccine). ? You have HIV or AIDS. ? You use needles to inject street drugs. ? You live with someone who has hepatitis B. ? You have had sex with someone who has hepatitis B. ? You get hemodialysis treatment. ? You take certain medicines for conditions, including cancer, organ transplantation, and autoimmune conditions.  Hepatitis C  Blood testing is recommended for: ? Everyone born from 1945 through 1965. ? Anyone with known risk factors for hepatitis C.  Sexually transmitted infections (STIs)  You should be screened for sexually transmitted infections (STIs) including gonorrhea and chlamydia if: ? You are sexually active and are younger than 73 years of age. ? You are older than 73 years of age and your health care provider tells you that you are at risk for this type of infection. ? Your sexual activity has changed since you were last screened and you are at an increased risk for chlamydia or gonorrhea. Ask your health care provider if you are at risk.  If you do not have HIV, but are at risk, it may be recommended that you take a prescription medicine daily to prevent HIV infection. This is called pre-exposure prophylaxis (PrEP). You are considered at risk if: ? You are sexually active and do not regularly use condoms  or know the HIV status of your partner(s). ? You take drugs by injection. ? You are sexually active with a partner who has HIV.  Talk with your health care provider about whether you are at high risk of being infected with HIV. If you choose to begin PrEP, you should first be tested for HIV. You should then be tested every 3 months for as long as you are taking PrEP. Pregnancy  If you are   premenopausal and you may become pregnant, ask your health care provider about preconception counseling.  If you may become pregnant, take 400 to 800 micrograms (mcg) of folic acid every day.  If you want to prevent pregnancy, talk to your health care provider about birth control (contraception). Osteoporosis and menopause  Osteoporosis is a disease in which the bones lose minerals and strength with aging. This can result in serious bone fractures. Your risk for osteoporosis can be identified using a bone density scan.  If you are 41 years of age or older, or if you are at risk for osteoporosis and fractures, ask your health care provider if you should be screened.  Ask your health care provider whether you should take a calcium or vitamin D supplement to lower your risk for osteoporosis.  Menopause may have certain physical symptoms and risks.  Hormone replacement therapy may reduce some of these symptoms and risks. Talk to your health care provider about whether hormone replacement therapy is right for you. Follow these instructions at home:  Schedule regular health, dental, and eye exams.  Stay current with your immunizations.  Do not use any tobacco products including cigarettes, chewing tobacco, or electronic cigarettes.  If you are pregnant, do not drink alcohol.  If you are breastfeeding, limit how much and how often you drink alcohol.  Limit alcohol intake to no more than 1 drink per day for nonpregnant women. One drink equals 12 ounces of beer, 5 ounces of wine, or 1 ounces of hard  liquor.  Do not use street drugs.  Do not share needles.  Ask your health care provider for help if you need support or information about quitting drugs.  Tell your health care provider if you often feel depressed.  Tell your health care provider if you have ever been abused or do not feel safe at home. This information is not intended to replace advice given to you by your health care provider. Make sure you discuss any questions you have with your health care provider. Document Released: 04/23/2011 Document Revised: 03/15/2016 Document Reviewed: 07/12/2015 Elsevier Interactive Patient Education  [employer].

## 2017-07-17 ENCOUNTER — Encounter: Payer: Self-pay | Admitting: Internal Medicine

## 2017-07-23 ENCOUNTER — Other Ambulatory Visit: Payer: Self-pay

## 2017-07-23 MED ORDER — LORAZEPAM 1 MG PO TABS: 1.0000 mg | ORAL_TABLET | Freq: Every day | ORAL | 5 refills | Status: DC

## 2017-07-23 NOTE — Telephone Encounter (Signed)
Received a refill request from [facility] on [address] for a refill of lorazepam. Last refill was 05/22/17 per [facility]. Last OV was 07/12/17. Please advise.

## 2017-10-07 ENCOUNTER — Ambulatory Visit: Payer: PPO | Admitting: Internal Medicine

## 2017-10-07 ENCOUNTER — Encounter: Payer: Self-pay | Admitting: Internal Medicine

## 2017-10-07 VITALS — BP 130/90 | HR 63 | Temp 97.8°F | Ht 65.5 in | Wt 158.0 lb

## 2017-10-07 DIAGNOSIS — R300 Dysuria: Secondary | ICD-10-CM | POA: Diagnosis not present

## 2017-10-07 DIAGNOSIS — N3000 Acute cystitis without hematuria: Secondary | ICD-10-CM

## 2017-10-07 LAB — POCT URINALYSIS DIPSTICK
BILIRUBIN UA: NEGATIVE
Glucose, UA: NEGATIVE
KETONES UA: NEGATIVE
NITRITE UA: NEGATIVE
PROTEIN UA: NEGATIVE
SPEC GRAV UA: 1.02 (ref 1.010–1.025)
UROBILINOGEN UA: 1 U/dL
pH, UA: 5.5 (ref 5.0–8.0)

## 2017-10-07 MED ORDER — SULFAMETHOXAZOLE-TRIMETHOPRIM 800-160 MG PO TABS: 1.0000 | ORAL_TABLET | Freq: Two times a day (BID) | ORAL | 0 refills | Status: DC

## 2017-10-07 NOTE — Patient Instructions (Signed)
We have sent in the bactrim to help the bladder infection. Take 1 pill twice a day.

## 2017-10-07 NOTE — Progress Notes (Signed)
   Subjective:    Patient ID: Lisa Cox, female    DOB: 1946-06-11, 71 y.o.   MRN: [id number]  HPI The patient is a 71 YO female coming in for possible UTI. Started about 2 days ago and is burning with urination. She has been also having some bladder pain and spasms. She is taking cranberry juice and trying to drink more liquids which is not helping. Overall symptoms are stable since onset. Denies fevers or chills. Denies abdominal pain or back pain.   Review of Systems  Constitutional: Negative.   Respiratory: Negative.   Cardiovascular: Negative.   Gastrointestinal: Negative.   Genitourinary: Positive for dysuria, frequency and urgency. Negative for decreased urine volume, flank pain, hematuria, pelvic pain, vaginal bleeding, vaginal discharge and vaginal pain.  Musculoskeletal: Negative.       Objective:   Physical Exam  Constitutional: She is oriented to person, place, and time. She appears well-developed and well-nourished.  HENT:  Head: Normocephalic and atraumatic.  Eyes: EOM are normal.  Neck: Normal range of motion.  Cardiovascular: Normal rate and regular rhythm.  Pulmonary/Chest: Effort normal and breath sounds normal. No respiratory distress. She has no wheezes. She has no rales.  Abdominal: Soft. Bowel sounds are normal. She exhibits no distension. There is no tenderness. There is no rebound.  Neurological: She is alert and oriented to person, place, and time. Coordination normal.  Skin: Skin is warm and dry.   Vitals:   10/07/17 1443  BP: 130/90  Pulse: 63  Temp: 97.8 F (36.6 C)  TempSrc: Oral  SpO2: 100%  Weight: 158 lb (71.7 kg)  Height: 5' 5.5" (1.664 m)      Assessment & Plan:

## 2017-10-07 NOTE — Assessment & Plan Note (Signed)
U/A consistent with UTI. Rx for bactrim given allergies to macrobid and previous pan-sensitive e coli.

## 2017-10-31 ENCOUNTER — Other Ambulatory Visit: Payer: Self-pay | Admitting: Cardiology

## 2017-11-13 ENCOUNTER — Encounter: Payer: Self-pay | Admitting: Cardiology

## 2017-11-13 ENCOUNTER — Ambulatory Visit: Payer: PPO | Admitting: Cardiology

## 2017-11-13 DIAGNOSIS — I119 Hypertensive heart disease without heart failure: Secondary | ICD-10-CM | POA: Diagnosis not present

## 2017-11-13 DIAGNOSIS — I493 Ventricular premature depolarization: Secondary | ICD-10-CM

## 2017-11-13 NOTE — Patient Instructions (Signed)
NO CHANGES WITH MEDICATIONS     Your physician wants you to follow-up in [place]. You will receive a reminder letter in the mail two months in advance. If you don't receive a letter, please call our office to schedule the follow-up appointment.    If you need a refill on your cardiac medications before your next appointment, please call your pharmacy.

## 2017-11-13 NOTE — Progress Notes (Signed)
PCP: Hoyt Koch, MD  Clinic Note: Chief Complaint  Patient presents with  . Follow-up    6 months  . Palpitations    PVCs    HPI: Lisa Cox is a 72 y.o. female who is being seen today for six-month follow-up evaluation of frequent PVCs at the request of Hoyt Koch, *. Lisa Cox is a former patient of Dr. Warren Danes whom he last saw on 12/19/2015.  Lisa Cox was last seen on May 06, 2017 to review the results of her monitor that showed RVOT PVCs.  She had had a GXT done unfortunately off of beta-blockers which showed frequent monomorphic RVOT pattern PVCs.  She had not had to use that much in the way of any as needed propranolol.  Recent Hospitalizations: n/a  Studies Personally Reviewed - (if available, images/films reviewed: From Epic Chart or Care Everywhere) None  Interval History: Lisa Cox returns today overall doing fairly well.  She tells me that she really is not noticing palpitations nearly as much.  Very rarely in the last several months that she had.  Usually everything is controlled with the atenolol at current dose.  She did notice them when she was dealing with a UTI several months ago, but notes that since that is completed she is doing okay now.  She seems to become tolerant of her palpitations not noticing when they are present, only noticing when they are very frequent.  That is only occurred a couple times in the last several months.  Usually resolved with propranolol. She denies any significant chest pain or dyspnea with rest or exertion. No PND, orthopnea or edema. No palpitations, lightheadedness, dizziness, weakness or syncope/near syncope. No TIA/amaurosis fugax symptoms.  No claudication.  ROS: A comprehensive was performed. Review of Systems  Constitutional: Negative for malaise/fatigue.  HENT: Negative for congestion and nosebleeds.   Respiratory: Positive for shortness of breath (Baseline).   Cardiovascular:      Per history of present illness  Gastrointestinal: Negative for abdominal pain, blood in stool, heartburn, melena and nausea.  Genitourinary: Negative for hematuria.  Musculoskeletal: Positive for joint pain. Negative for myalgias.  Neurological: Negative for dizziness.  Endo/Heme/Allergies: Negative for environmental allergies.  Psychiatric/Behavioral: The patient is nervous/anxious.   All other systems reviewed and are negative.  I have reviewed and (if needed) personally updated the patient's problem list, medications, allergies, past medical and surgical history, social and family history.   Past Medical History:  Diagnosis Date  . Anxiety   . Bell's palsy 2009  . Cancer ([place]) Feb.2011   breast,lumpectomy right side followed by radiation  . Depression   . GERD (gastroesophageal reflux disease)   . Hyperlipidemia   . Hypertension   . Palpitations    Frequent PVCs with some bigeminy noted on 48-hour monitor. No arrhythmias.  . Personal history of radiation therapy     Past Surgical History:  Procedure Laterality Date  . BREAST LUMPECTOMY  Feb. 2011   right breast,followed by radiation therapy  . EYE SURGERY  1953   skin removal in eye  . GXT  04/2017   Exercise tolerance test off of beta-blockers: Normal blood pressure response to exercise.  No EKG changes to suggest ischemic changes.  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  Seen at rest, exercise and recovery.  . TUBAL LIGATION  1978    Current Meds  Medication Sig  . acetaminophen (TYLENOL) 500 MG tablet Take 1,000 mg by mouth every  6 (six) hours as needed. For cramping.  Marland Kitchen aspirin 81 MG tablet Take 81 mg by mouth daily as needed (blood thinner). Does not take on a daily basis  . atenolol (TENORMIN) 25 MG tablet Take 1 tablet (25 mg total) by mouth 2 (two) times daily. Or as directed by your doctor  . b complex vitamins tablet Take 1 tablet by mouth daily as needed (supplement).   . Calcium Citrate-Vitamin  D (CALCIUM + D PO) Take 2 tablets by mouth daily.  . carboxymethylcellulose (REFRESH TEARS) 0.5 % SOLN Place 1 drop into both eyes 3 (three) times daily as needed (dry eye).   Marland Kitchen dimenhyDRINATE (DRAMAMINE) 50 MG tablet Take 50 mg by mouth every 8 (eight) hours as needed. Reported on 04/19/2016  . LORazepam (ATIVAN) 1 MG tablet Take 1 tablet (1 mg total) by mouth at bedtime.  . Multiple Vitamins-Minerals (PRESERVISION AREDS 2) CAPS Take 2 capsules by mouth daily.  . Omega-3 Fatty Acids (FISH OIL CONCENTRATE PO) Take 1 capsule by mouth daily.   . phenazopyridine (PYRIDIUM) 95 MG tablet Take 1 tablet (95 mg total) by mouth 3 (three) times daily as needed for pain.  Marland Kitchen propranolol (INDERAL) 20 MG tablet Take 1 tablet (20 mg total) by mouth every 8 (eight) hours as needed.  . ramipril (ALTACE) 10 MG capsule TAKE 2 CAPSULES BY MOUTH EVERY DAY  . ranitidine (ZANTAC) 150 MG tablet Take 150 mg by mouth daily.  Marland Kitchen sulfamethoxazole-trimethoprim (BACTRIM DS,SEPTRA DS) 800-160 MG tablet Take 1 tablet by mouth 2 (two) times daily.  - stopped Bactrim after UTI  Allergies  Allergen Reactions  . Amlodipine     Peripheral edema  . Crestor [Rosuvastatin Calcium]     Leg cramps  . Erythromycin     nausea  . Macrobid [Nitrofurantoin Monohyd Macro] Nausea And Vomiting  . Niacin And Related Itching  . Restoril     Reaction-"nervous"  . Zolpidem Tartrate     Reaction-"nervous"    Social History   Socioeconomic History  . Marital status: Divorced    Spouse name: None  . Number of children: None  . Years of education: None  . Highest education level: None  Social Needs  . Financial resource strain: None  . Food insecurity - worry: None  . Food insecurity - inability: None  . Transportation needs - medical: None  . Transportation needs - non-medical: None  Occupational History  . None  Tobacco Use  . Smoking status: Never Smoker  . Smokeless tobacco: Never Used  Substance and Sexual Activity  .  Alcohol use: No  . Drug use: No  . Sexual activity: Not Currently  Other Topics Concern  . None  Social History Narrative  . None    family history includes Cancer in her father; Heart attack in her father; Heart disease in her father; Hypertension in her brother and sister; Stroke in her maternal aunt.  Wt Readings from Last 3 Encounters:  11/13/17 160 lb (72.6 kg)  10/07/17 158 lb (71.7 kg)  07/12/17 155 lb (70.3 kg)    PHYSICAL EXAM BP 136/60   Pulse 66   Ht 5' 5.5" (1.664 m)   Wt 160 lb (72.6 kg)   BMI 26.22 kg/m   Physical Exam  Constitutional: She is oriented to person, place, and time. She appears well-developed and well-nourished. No distress.  Well-groomed.  Healthy-appearing  HENT:  Head: Normocephalic and atraumatic.  Neck: Normal range of motion. Neck supple. No hepatojugular reflux and  no JVD present. Carotid bruit is not present.  Cardiovascular: Normal rate, regular rhythm, normal heart sounds and normal pulses. Frequent extrasystoles are present. PMI is not displaced. Exam reveals no gallop and no friction rub.  No murmur heard. Pulmonary/Chest: Breath sounds normal. No respiratory distress. She has no wheezes. She has no rales.  Musculoskeletal: Normal range of motion. She exhibits no edema.  Neurological: She is alert and oriented to person, place, and time. No cranial nerve deficit.  Psychiatric: She has a normal mood and affect. Her behavior is normal. Judgment and thought content normal.  Nursing note and vitals reviewed.   Adult ECG Report n/a  Other studies Reviewed: Additional studies/ records that were reviewed today include:  Recent Labs:  Managed by PCP Lab Results  Component Value Date   CREATININE 1.19 07/12/2017   BUN 16 07/12/2017   NA 134 (L) 07/12/2017   K 4.6 07/12/2017   CL 101 07/12/2017   CO2 26 07/12/2017   Lab Results  Component Value Date   CHOL 186 07/12/2017   HDL 39.10 07/12/2017   LDLCALC 122 (H) 07/12/2017   TRIG  127.0 07/12/2017   CHOLHDL 5 07/12/2017     ASSESSMENT / PLAN: Problem List Items Addressed This Visit    Benign hypertensive heart disease without heart failure (Chronic)    Relatively stable blood pressure on current dose of ACE inhibitor and beta-blocker.  Not having any orthostatic symptoms.  No major changes.  She does have in her blood pressure room to tolerate propranolol.      Frequent unifocal PVCs (Chronic)    RVOT PVCs essentially well controlled with atenolol.  Very infrequent use of propranolol.    trolled, I on my exam she had significant amount of ectopy, but did not feel it.  As such, with benign PVCs, I am reluctant to adjust medications to much.  Low threshold to convert to calcium channel blocker from beta-blocker.         Current medicines are reviewed at length with the patient today. (+/- concerns) n/a The following changes have been made: No change  Patient Instructions  NO CHANGES WITH MEDICATIONS     Your physician wants you to follow-up in [place]. You will receive a reminder letter in the mail two months in advance. If you don't receive a letter, please call our office to schedule the follow-up appointment.    If you need a refill on your cardiac medications before your next appointment, please call your pharmacy.    Studies Ordered:   No orders of the defined types were placed in this encounter.     Glenetta Hew, M.D., M.S. Interventional Cardiologist   Pager # [phone] Phone # [phone] [address]. [place], [place] [address]

## 2017-11-14 ENCOUNTER — Encounter: Payer: Self-pay | Admitting: Cardiology

## 2017-11-14 NOTE — Assessment & Plan Note (Signed)
Relatively stable blood pressure on current dose of ACE inhibitor and beta-blocker.  Not having any orthostatic symptoms.  No major changes.  She does have in her blood pressure room to tolerate propranolol.

## 2017-11-14 NOTE — Assessment & Plan Note (Addendum)
RVOT PVCs essentially well controlled with atenolol.  Very infrequent use of propranolol.    trolled, I on my exam she had significant amount of ectopy, but did not feel it.  As such, with benign PVCs, I am reluctant to adjust medications to much.  Low threshold to convert to calcium channel blocker from beta-blocker.

## 2017-11-29 ENCOUNTER — Ambulatory Visit: Payer: Self-pay | Admitting: *Deleted

## 2017-11-29 NOTE — Telephone Encounter (Signed)
Called in c/o urinary burning and frequency for 3 days.   See triage notes.   She has decided to go to the urgent care center.   I offered her an appt at the [facility] but she preferred to go to urgent care instead. Reason for Disposition . Age > 50 years  Answer Assessment - Initial Assessment Questions 1. SEVERITY: "How bad is the pain?"  (e.g., Scale 1-10; mild, moderate, or severe)   - MILD (1-3): complains slightly about urination hurting   - MODERATE (4-7): interferes with normal activities     - SEVERE (8-10): excruciating, unwilling or unable to urinate because of the pain      Having burning 2. FREQUENCY: "How many times have you had painful urination today?"      Frequent   And also nausea maybe from the Pyridine. 3. PATTERN: "Is pain present every time you urinate or just sometimes?"      Yes 4. ONSET: "When did the painful urination start?"      3 days ago 5. FEVER: "Do you have a fever?" If so, ask: "What is your temperature, how was it measured, and when did it start?"     I'm not feeling good today.    Not really 6. PAST UTI: "Have you had a urine infection before?" If so, ask: "When was the last time?" and "What happened that time?"      Yes many times.  About every couple of months 7. CAUSE: "What do you think is causing the painful urination?"  (e.g., UTI, scratch, Herpes sore)     UTI 8. OTHER SYMPTOMS: "Do you have any other symptoms?" (e.g., flank pain, vaginal discharge, genital sores, urgency, blood in urine)     No other symptoms. 9. PREGNANCY: "Is there any chance you are pregnant?" "When was your last menstrual period?"     Not asked due to age.  Protocols used: [place]

## 2017-12-02 ENCOUNTER — Encounter: Payer: Self-pay | Admitting: Family

## 2017-12-02 ENCOUNTER — Ambulatory Visit (INDEPENDENT_AMBULATORY_CARE_PROVIDER_SITE_OTHER): Payer: PPO | Admitting: Family

## 2017-12-02 ENCOUNTER — Other Ambulatory Visit: Payer: PPO

## 2017-12-02 VITALS — BP 130/80 | HR 73 | Temp 98.2°F | Ht 65.0 in | Wt 159.0 lb

## 2017-12-02 DIAGNOSIS — N390 Urinary tract infection, site not specified: Secondary | ICD-10-CM

## 2017-12-02 DIAGNOSIS — N3090 Cystitis, unspecified without hematuria: Secondary | ICD-10-CM

## 2017-12-02 DIAGNOSIS — R300 Dysuria: Secondary | ICD-10-CM

## 2017-12-02 LAB — POC URINALSYSI DIPSTICK (AUTOMATED)
Bilirubin, UA: NEGATIVE
Blood, UA: 10
Glucose, UA: NEGATIVE
Ketones, UA: NEGATIVE
NITRITE UA: NEGATIVE
Protein, UA: 15
Spec Grav, UA: 1.025 (ref 1.010–1.025)
UROBILINOGEN UA: 0.2 U/dL
pH, UA: 6 (ref 5.0–8.0)

## 2017-12-02 MED ORDER — PHENAZOPYRIDINE HCL 100 MG PO TABS: 100.0000 mg | ORAL_TABLET | Freq: Three times a day (TID) | ORAL | 0 refills | Status: DC | PRN

## 2017-12-02 MED ORDER — CIPROFLOXACIN HCL 250 MG PO TABS: 250.0000 mg | ORAL_TABLET | Freq: Two times a day (BID) | ORAL | 0 refills | Status: DC

## 2017-12-02 NOTE — Progress Notes (Signed)
Lisa Cox is a 72 y.o. female with the following history as recorded in EpicCare:  Patient Active Problem List   Diagnosis Date Noted  . Routine general medical examination at a health care facility 07/12/2017  . Essential hypertension 07/12/2017  . Frequent unifocal PVCs 04/02/2017  . Ventricular bigeminy seen on cardiac monitor 05/26/2016  . UTI (urinary tract infection) 04/19/2016  . Insomnia 04/10/2016  . History of breast cancer 09/29/2011  . Hypercholesterolemia 07/11/2011  . Benign hypertensive heart disease without heart failure 07/11/2011  . Palpitations     Current Outpatient Medications  Medication Sig Dispense Refill  . acetaminophen (TYLENOL) 500 MG tablet Take 1,000 mg by mouth every 6 (six) hours as needed. For cramping.    Marland Kitchen aspirin 81 MG tablet Take 81 mg by mouth daily as needed (blood thinner). Does not take on a daily basis    . atenolol (TENORMIN) 25 MG tablet Take 1 tablet (25 mg total) by mouth 2 (two) times daily. Or as directed by your doctor 180 tablet 3  . b complex vitamins tablet Take 1 tablet by mouth daily as needed (supplement).     . Calcium Citrate-Vitamin D (CALCIUM + D PO) Take 2 tablets by mouth daily.    . carboxymethylcellulose (REFRESH TEARS) 0.5 % SOLN Place 1 drop into both eyes 3 (three) times daily as needed (dry eye).     Marland Kitchen dimenhyDRINATE (DRAMAMINE) 50 MG tablet Take 50 mg by mouth every 8 (eight) hours as needed. Reported on 04/19/2016    . LORazepam (ATIVAN) 1 MG tablet Take 1 tablet (1 mg total) by mouth at bedtime. 30 tablet 5  . Multiple Vitamins-Minerals (PRESERVISION AREDS 2) CAPS Take 2 capsules by mouth daily.    . Omega-3 Fatty Acids (FISH OIL CONCENTRATE PO) Take 1 capsule by mouth daily.     . phenazopyridine (PYRIDIUM) 95 MG tablet Take 1 tablet (95 mg total) by mouth 3 (three) times daily as needed for pain. 10 tablet 0  . propranolol (INDERAL) 20 MG tablet Take 1 tablet (20 mg total) by mouth every 8 (eight) hours as  needed. 45 tablet 6  . ramipril (ALTACE) 10 MG capsule TAKE 2 CAPSULES BY MOUTH EVERY DAY 60 capsule 2  . ranitidine (ZANTAC) 150 MG tablet Take 150 mg by mouth daily.    Marland Kitchen sulfamethoxazole-trimethoprim (BACTRIM DS,SEPTRA DS) 800-160 MG tablet Take 1 tablet by mouth 2 (two) times daily. 10 tablet 0   No current facility-administered medications for this visit.     Allergies: Amlodipine; Crestor [rosuvastatin calcium]; Erythromycin; Macrobid [nitrofurantoin monohyd macro]; Niacin and related; Restoril; and Zolpidem tartrate  Past Medical History:  Diagnosis Date  . Anxiety   . Bell's palsy 2009  . Cancer ([place]) Feb.2011   breast,lumpectomy right side followed by radiation  . Depression   . GERD (gastroesophageal reflux disease)   . Hyperlipidemia   . Hypertension   . Palpitations    Frequent PVCs with some bigeminy noted on 48-hour monitor. No arrhythmias.  . Personal history of radiation therapy     Past Surgical History:  Procedure Laterality Date  . BREAST LUMPECTOMY  Feb. 2011   right breast,followed by radiation therapy  . EYE SURGERY  1953   skin removal in eye  . GXT  04/2017   Exercise tolerance test off of beta-blockers: Normal blood pressure response to exercise.  No EKG changes to suggest ischemic changes.  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  Seen at rest, exercise  and recovery.  . TUBAL LIGATION  1978    Family History  Problem Relation Age of Onset  . Cancer Father        pancreatic  . Heart disease Father   . Heart attack Father   . Hypertension Sister   . Hypertension Brother   . Stroke Maternal Aunt     Social History   Tobacco Use  . Smoking status: Never Smoker  . Smokeless tobacco: Never Used  Substance Use Topics  . Alcohol use: No    Subjective:  Patient presents with concerns for possible UTI; complaining of burning/ frequency x 4-5 days; in reviewing notes, she has had 3 UTIs since August 2018; denies any blood in her urine; +  pelvic discomfort; known history of hematuria- has been under care of urology for this in the past;   Objective:  Vitals:   12/02/17 1606  BP: 130/80  Pulse: 73  Temp: 98.2 F (36.8 C)  TempSrc: Oral  SpO2: 98%  Weight: 159 lb 0.6 oz (72.1 kg)  Height: 5\' 5"  (1.651 m)    General: Well developed, well nourished, in no acute distress  Skin : Warm and dry.  Head: Normocephalic and atraumatic  Eyes: Sclera and conjunctiva clear; pupils round and reactive to light; extraocular movements intact  Ears: External normal; canals clear; tympanic membranes normal  Oropharynx: Pink, supple. No suspicious lesions  Neck: Supple without thyromegaly, adenopathy  Lungs: Respirations unlabored; clear to auscultation bilaterally without wheeze, rales, rhonchi  CVS exam: normal rate and regular rhythm.  Neurologic: Alert and oriented; speech intact; face symmetrical; moves all extremities well; CNII-XII intact without focal deficit  Assessment:  1. Dysuria     Plan:  Will treat for UTI; check urine culture as well; refer to urology due to frequency of infections; Rx for Cipro 250 mg bid x 5 days; increase fluids, rest and follow-up worse, no better.   No Follow-up on file.  Orders Placed This Encounter  Procedures  . Urine Culture    Standing Status:   Future    Standing Expiration Date:   12/02/2018    Requested Prescriptions    No prescriptions requested or ordered in this encounter

## 2017-12-03 LAB — URINE CULTURE
MICRO NUMBER:: 90180005
Result:: NO GROWTH
SPECIMEN QUALITY:: ADEQUATE

## 2017-12-23 ENCOUNTER — Other Ambulatory Visit: Payer: Self-pay | Admitting: Internal Medicine

## 2017-12-23 NOTE — Telephone Encounter (Signed)
Copied from [place] [id number]. Topic: Quick Communication - Rx Refill/Question >> Dec 23, 2017  4:23 PM Oliver Pila B wrote: Medication: Tamiflu  Pt states someone in her household has the flu and the other physician asked to have pt call her pcp to ask for the medication

## 2017-12-24 DIAGNOSIS — N3020 Other chronic cystitis without hematuria: Secondary | ICD-10-CM | POA: Diagnosis not present

## 2017-12-24 MED ORDER — OSELTAMIVIR PHOSPHATE 75 MG PO CAPS: 75.0000 mg | ORAL_CAPSULE | Freq: Every day | ORAL | 0 refills | Status: DC

## 2017-12-24 NOTE — Telephone Encounter (Signed)
Left VM to call back to determine if pt having any symptoms.

## 2017-12-24 NOTE — Telephone Encounter (Signed)
Patient informed. 

## 2017-12-24 NOTE — Telephone Encounter (Signed)
Pt reports her granddaughter was dx with the flu yesterday; pt has been caregiver for her. Requesting [facility] be called in for her. Pt is asymptomatic presently. Please advise.  [phone] [id number] - [place], [place]

## 2017-12-24 NOTE — Telephone Encounter (Signed)
Sent in 1 pill daily.

## 2018-01-07 DIAGNOSIS — N3020 Other chronic cystitis without hematuria: Secondary | ICD-10-CM | POA: Diagnosis not present

## 2018-01-07 DIAGNOSIS — R3129 Other microscopic hematuria: Secondary | ICD-10-CM | POA: Diagnosis not present

## 2018-01-27 DIAGNOSIS — N3020 Other chronic cystitis without hematuria: Secondary | ICD-10-CM | POA: Diagnosis not present

## 2018-01-27 DIAGNOSIS — N952 Postmenopausal atrophic vaginitis: Secondary | ICD-10-CM | POA: Diagnosis not present

## 2018-02-03 ENCOUNTER — Other Ambulatory Visit: Payer: Self-pay | Admitting: Internal Medicine

## 2018-02-04 NOTE — Telephone Encounter (Signed)
Control database checked last refill: 12/22/2017 LOV:  Acute 10/07/2017, CPE 07/12/2017

## 2018-02-14 ENCOUNTER — Other Ambulatory Visit: Payer: Self-pay | Admitting: Obstetrics and Gynecology

## 2018-02-14 DIAGNOSIS — Z1231 Encounter for screening mammogram for malignant neoplasm of breast: Secondary | ICD-10-CM

## 2018-03-12 ENCOUNTER — Ambulatory Visit
Admission: RE | Admit: 2018-03-12 | Discharge: 2018-03-12 | Disposition: A | Discharge status: Home / Self Care | Payer: PPO | Source: Ambulatory Visit | Attending: Obstetrics and Gynecology | Admitting: Obstetrics and Gynecology

## 2018-03-12 DIAGNOSIS — Z1231 Encounter for screening mammogram for malignant neoplasm of breast: Secondary | ICD-10-CM | POA: Diagnosis not present

## 2018-04-23 DIAGNOSIS — H52203 Unspecified astigmatism, bilateral: Secondary | ICD-10-CM | POA: Diagnosis not present

## 2018-04-23 DIAGNOSIS — H353131 Nonexudative age-related macular degeneration, bilateral, early dry stage: Secondary | ICD-10-CM | POA: Diagnosis not present

## 2018-04-23 DIAGNOSIS — H2513 Age-related nuclear cataract, bilateral: Secondary | ICD-10-CM | POA: Diagnosis not present

## 2018-04-24 ENCOUNTER — Other Ambulatory Visit: Payer: Self-pay | Admitting: Cardiology

## 2018-04-28 DIAGNOSIS — Z6825 Body mass index (BMI) 25.0-25.9, adult: Secondary | ICD-10-CM | POA: Diagnosis not present

## 2018-04-28 DIAGNOSIS — Z01419 Encounter for gynecological examination (general) (routine) without abnormal findings: Secondary | ICD-10-CM | POA: Diagnosis not present

## 2018-05-17 ENCOUNTER — Other Ambulatory Visit: Payer: Self-pay | Admitting: Cardiology

## 2018-05-19 ENCOUNTER — Other Ambulatory Visit: Payer: Self-pay | Admitting: Cardiology

## 2018-05-30 ENCOUNTER — Other Ambulatory Visit (INDEPENDENT_AMBULATORY_CARE_PROVIDER_SITE_OTHER): Payer: PPO

## 2018-05-30 ENCOUNTER — Ambulatory Visit: Payer: Self-pay

## 2018-05-30 ENCOUNTER — Ambulatory Visit (INDEPENDENT_AMBULATORY_CARE_PROVIDER_SITE_OTHER): Payer: PPO | Admitting: Internal Medicine

## 2018-05-30 ENCOUNTER — Encounter: Payer: Self-pay | Admitting: Internal Medicine

## 2018-05-30 ENCOUNTER — Other Ambulatory Visit: Payer: PPO

## 2018-05-30 VITALS — BP 130/70 | HR 37 | Temp 98.1°F | Ht 65.0 in | Wt 157.0 lb

## 2018-05-30 DIAGNOSIS — R002 Palpitations: Secondary | ICD-10-CM

## 2018-05-30 LAB — CBC
HCT: 38.5 % (ref 36.0–46.0)
Hemoglobin: 13 g/dL (ref 12.0–15.0)
MCHC: 33.9 g/dL (ref 30.0–36.0)
MCV: 91.5 fl (ref 78.0–100.0)
PLATELETS: 186 10*3/uL (ref 150.0–400.0)
RBC: 4.21 Mil/uL (ref 3.87–5.11)
RDW: 13.7 % (ref 11.5–15.5)
WBC: 8.4 10*3/uL (ref 4.0–10.5)

## 2018-05-30 LAB — COMPREHENSIVE METABOLIC PANEL
ALT: 13 U/L (ref 0–35)
AST: 15 U/L (ref 0–37)
Albumin: 3.9 g/dL (ref 3.5–5.2)
Alkaline Phosphatase: 78 U/L (ref 39–117)
BUN: 11 mg/dL (ref 6–23)
CALCIUM: 9.3 mg/dL (ref 8.4–10.5)
CHLORIDE: 100 meq/L (ref 96–112)
CO2: 31 meq/L (ref 19–32)
Creatinine, Ser: 0.99 mg/dL (ref 0.40–1.20)
GFR: 58.21 mL/min — AB (ref >60.00)
Glucose, Bld: 91 mg/dL (ref 70–99)
Potassium: 4.2 mEq/L (ref 3.5–5.1)
Sodium: 137 mEq/L (ref 135–145)
Total Bilirubin: 0.5 mg/dL (ref 0.2–1.2)
Total Protein: 6.7 g/dL (ref 6.0–8.3)

## 2018-05-30 LAB — TSH: TSH: 4.17 u[IU]/mL (ref 0.35–4.50)

## 2018-05-30 LAB — T4, FREE: Free T4: 0.91 ng/dL (ref 0.60–1.60)

## 2018-05-30 LAB — TROPONIN I: TNIDX: 0 ug/l (ref 0.00–0.06)

## 2018-05-30 NOTE — Progress Notes (Signed)
   Subjective:    Patient ID: Lisa Cox, female    DOB: 01/18/1946, 72 y.o.   MRN: [id number]  HPI The patient is a 72 YO female coming in for heart palpitations. She has had workup for palpitations in the past and had multifocal pvcs. She took her atenolol this morning and around lunchtime got some pressure and palpitations with nausea in her chest. This has passed since. She did not take propranolol since not enough time had passed. She checked her pulse and it was jumping around but 1 reading was in the 30s. She denies headaches or chest pains. Denies fevers or chills. Has not been sleeping well lately and is drinking some increased amount of coca cola.   Review of Systems  Constitutional: Positive for activity change, appetite change and fatigue.  HENT: Negative.   Eyes: Negative.   Respiratory: Negative for cough, chest tightness and shortness of breath.   Cardiovascular: Positive for palpitations. Negative for chest pain and leg swelling.  Gastrointestinal: Positive for nausea. Negative for abdominal distention, abdominal pain, constipation, diarrhea and vomiting.  Musculoskeletal: Negative.   Skin: Negative.   Neurological: Negative.   Psychiatric/Behavioral: Positive for sleep disturbance.      Objective:   Physical Exam  Constitutional: She is oriented to person, place, and time. She appears well-developed and well-nourished.  HENT:  Head: Normocephalic and atraumatic.  Eyes: EOM are normal.  Neck: Normal range of motion.  Cardiovascular: Normal rate and regular rhythm.  Pulmonary/Chest: Effort normal and breath sounds normal. No respiratory distress. She has no wheezes. She has no rales.  Abdominal: Soft. Bowel sounds are normal. She exhibits no distension. There is no tenderness. There is no rebound.  Musculoskeletal: She exhibits no edema.  Neurological: She is alert and oriented to person, place, and time. Coordination normal.  Skin: Skin is warm and dry.    Psychiatric: She has a normal mood and affect.   Vitals:   05/30/18 1439  BP: 130/70  Pulse: (!) 37  Temp: 98.1 F (36.7 C)  TempSrc: Oral  SpO2: 97%  Weight: 157 lb (71.2 kg)  Height: 5\' 5"  (1.651 m)   EKG: Rate 73, sinus, frequent PVCs, axis normal, intervals normal, no st or t wave changes. No change when compared to 2018.    Assessment & Plan:

## 2018-05-30 NOTE — Telephone Encounter (Signed)
Pt. States today she noticed "heart skipping beats." Has had palpitations in the past.Denies chest pain or shortness of breath. States at times she has some nausea. No other symptoms. Checked pulse with BP machine - gets 30's to 50's."I know the machine isn't picking up all the beats." Appointment made for today. Reason for Disposition . [1] Palpitations AND [2] no improvement after using CARE ADVICE  Answer Assessment - Initial Assessment Questions 1. DESCRIPTION: "Please describe your heart rate or heart beat that you are having" (e.g., fast/slow, regular/irregular, skipped or extra beats, "palpitations")     Skipping beats 2. ONSET: "When did it start?" (Minutes, hours or days)      Today 3. DURATION: "How long does it last" (e.g., seconds, minutes, hours)     Continuous  4. PATTERN "Does it come and go, or has it been constant since it started?"  "Does it get worse with exertion?"   "Are you feeling it now?"     Constant 5. TAP: "Using your hand, can you tap out what you are feeling on a chair or table in front of you, so that I can hear?" (Note: not all patients can do this)       Yes 6. HEART RATE: "Can you tell me your heart rate?" "How many beats in 15 seconds?"  (Note: not all patients can do this)       55 7. RECURRENT SYMPTOM: "Have you ever had this before?" If so, ask: "When was the last time?" and "What happened that time?"      Yes 8. CAUSE: "What do you think is causing the palpitations?"     Unsure 9. CARDIAC HISTORY: "Do you have any history of heart disease?" (e.g., heart attack, angina, bypass surgery, angioplasty, arrhythmia)      No 10. OTHER SYMPTOMS: "Do you have any other symptoms?" (e.g., dizziness, chest pain, sweating, difficulty breathing)       Nausea  11. PREGNANCY: "Is there any chance you are pregnant?" "When was your last menstrual period?"       No  Protocols used: HEART RATE AND HEARTBEAT QUESTIONS-A-AH

## 2018-05-30 NOTE — Telephone Encounter (Signed)
Noted  

## 2018-05-30 NOTE — Patient Instructions (Signed)
We will check the labs today and call you back with the results.    

## 2018-05-30 NOTE — Assessment & Plan Note (Signed)
Several PVCs on EKG done in the office. No significant change from prior although symptoms were resolving prior to EKG today. Checking CBC, CMP, thyroid and troponin today. She is advised that this could be related to lack of sleep and possible increase in caffeine. If more episodes call cardiology for visit.

## 2018-06-13 ENCOUNTER — Ambulatory Visit: Payer: PPO | Admitting: Internal Medicine

## 2018-06-20 DIAGNOSIS — L814 Other melanin hyperpigmentation: Secondary | ICD-10-CM | POA: Diagnosis not present

## 2018-06-20 DIAGNOSIS — L570 Actinic keratosis: Secondary | ICD-10-CM | POA: Diagnosis not present

## 2018-06-20 DIAGNOSIS — L821 Other seborrheic keratosis: Secondary | ICD-10-CM | POA: Diagnosis not present

## 2018-06-20 DIAGNOSIS — L578 Other skin changes due to chronic exposure to nonionizing radiation: Secondary | ICD-10-CM | POA: Diagnosis not present

## 2018-06-20 DIAGNOSIS — D229 Melanocytic nevi, unspecified: Secondary | ICD-10-CM | POA: Diagnosis not present

## 2018-06-26 DIAGNOSIS — H2512 Age-related nuclear cataract, left eye: Secondary | ICD-10-CM | POA: Diagnosis not present

## 2018-06-26 DIAGNOSIS — H25812 Combined forms of age-related cataract, left eye: Secondary | ICD-10-CM | POA: Diagnosis not present

## 2018-07-18 ENCOUNTER — Ambulatory Visit: Payer: PPO

## 2018-07-30 ENCOUNTER — Other Ambulatory Visit: Payer: Self-pay | Admitting: Internal Medicine

## 2018-07-30 NOTE — Telephone Encounter (Signed)
Control database checked last refill: 06/28/2018 LOV: acute 05/30/18, cpe 07/12/17 NOV: none

## 2018-08-05 ENCOUNTER — Ambulatory Visit (INDEPENDENT_AMBULATORY_CARE_PROVIDER_SITE_OTHER): Payer: PPO

## 2018-08-05 DIAGNOSIS — Z23 Encounter for immunization: Secondary | ICD-10-CM | POA: Diagnosis not present

## 2018-08-06 NOTE — Progress Notes (Signed)
Cardiology Office Note   Date:  08/07/2018   ID:  Lisa Cox, DOB 1946/04/12, MRN [id number]  PCP:  Hoyt Koch, MD  Cardiologist: [place]  Chief Complaint  Patient presents with  . Palpitations  . Hypertension     History of Present Illness: Lisa Cox is a 73 y.o. female who presents for ongoing assessment and management of frequent PVC's and hypertension.She remained on atenolol.Other history includes GERD, Anxiety, Depression, and Bell's Palsy. Dr. Ellyn Hack felt that the PVC's were related to RVOT PVC's and were controlled on current regimen. She was asymptomatic. Could potentially convert to CCB as an option.   She states that her BP is rising, and her palpitations have worsened over the last few weeks. She has brought BP recordings with her. Running into the 156/90 range. She does not feel that her medications are helping with her symptoms.    Past Medical History:  Diagnosis Date  . Anxiety   . Bell's palsy 2009  . Cancer ([place]) Feb.2011   breast,lumpectomy right side followed by radiation  . Depression   . GERD (gastroesophageal reflux disease)   . Hyperlipidemia   . Hypertension   . Palpitations    Frequent PVCs with some bigeminy noted on 48-hour monitor. No arrhythmias.  . Personal history of radiation therapy     Past Surgical History:  Procedure Laterality Date  . BREAST LUMPECTOMY  Feb. 2011   right breast,followed by radiation therapy  . EYE SURGERY  1953   skin removal in eye  . GXT  04/2017   Exercise tolerance test off of beta-blockers: Normal blood pressure response to exercise.  No EKG changes to suggest ischemic changes.  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  Seen at rest, exercise and recovery.  . TUBAL LIGATION  1978     Current Outpatient Medications  Medication Sig Dispense Refill  . acetaminophen (TYLENOL) 500 MG tablet Take 1,000 mg by mouth every 6 (six) hours as needed. For cramping.    Marland Kitchen aspirin 81 MG  tablet Take 81 mg by mouth daily as needed (blood thinner). Does not take on a daily basis    . b complex vitamins tablet Take 1 tablet by mouth daily as needed (supplement).     . Calcium Citrate-Vitamin D (CALCIUM + D PO) Take 2 tablets by mouth daily.    . carboxymethylcellulose (REFRESH TEARS) 0.5 % SOLN Place 1 drop into both eyes 3 (three) times daily as needed (dry eye).     Marland Kitchen dimenhyDRINATE (DRAMAMINE) 50 MG tablet Take 50 mg by mouth every 8 (eight) hours as needed. Reported on 04/19/2016    . LORazepam (ATIVAN) 1 MG tablet TAKE 1 TABLET BY MOUTH EVERYDAY AT BEDTIME 30 tablet 2  . Multiple Vitamins-Minerals (PRESERVISION AREDS 2) CAPS Take 2 capsules by mouth daily.    . Omega-3 Fatty Acids (FISH OIL CONCENTRATE PO) Take 1 capsule by mouth daily.     Marland Kitchen oseltamivir (TAMIFLU) 75 MG capsule Take 1 capsule (75 mg total) by mouth daily. 7 capsule 0  . phenazopyridine (PYRIDIUM) 100 MG tablet Take 1 tablet (100 mg total) by mouth 3 (three) times daily as needed for pain. 10 tablet 0  . ramipril (ALTACE) 10 MG capsule TAKE 2 CAPSULES BY MOUTH EVERY DAY 60 capsule 7  . ranitidine (ZANTAC) 150 MG tablet Take 150 mg by mouth daily.    Marland Kitchen diltiazem (CARDIZEM) 30 MG tablet Take 1 tablet (30 mg total) by mouth 3 (three)  times daily. 90 tablet 1   No current facility-administered medications for this visit.     Allergies:   Amlodipine; Crestor [rosuvastatin calcium]; Erythromycin; Macrobid [nitrofurantoin monohyd macro]; Niacin and related; Restoril; and Zolpidem tartrate    Social History:  The patient  reports that she has never smoked. She has never used smokeless tobacco. She reports that she does not drink alcohol or use drugs.   Family History:  The patient's family history includes Breast cancer (age of onset: 30) in her paternal aunt; Cancer in her father; Heart attack in her father; Heart disease in her father; Hypertension in her brother and sister; Stroke in her maternal aunt.    ROS:  All other systems are reviewed and negative. Unless otherwise mentioned in H&P    PHYSICAL EXAM: VS:  BP (!) 156/82   Pulse 74   Ht 5\' 5"  (1.651 m)   Wt 156 lb (70.8 kg)   BMI 25.96 kg/m  , BMI Body mass index is 25.96 kg/m. GEN: Well nourished, well developed, in no acute distress HEENT: normal Neck: no JVD, carotid bruits, or masses Cardiac:IRRR; no murmurs, rubs, or gallops,no edema  Respiratory:  Clear to auscultation bilaterally, normal work of breathing GI: soft, nontender, nondistended, + BS MS: no deformity or atrophy Skin: warm and dry, no rash Neuro:  Strength and sensation are intact Psych: euthymic mood, full affect   EKG:  NSR rate of 74   Recent Labs: 05/30/2018: ALT 13; BUN 11; Creatinine, Ser 0.99; Hemoglobin 13.0; Platelets 186.0; Potassium 4.2; Sodium 137; TSH 4.17    Lipid Panel    Component Value Date/Time   CHOL 186 07/12/2017 0857   TRIG 127.0 07/12/2017 0857   HDL 39.10 07/12/2017 0857   CHOLHDL 5 07/12/2017 0857   VLDL 25.4 07/12/2017 0857   LDLCALC 122 (H) 07/12/2017 0857      Wt Readings from Last 3 Encounters:  08/07/18 156 lb (70.8 kg)  05/30/18 157 lb (71.2 kg)  12/02/17 159 lb 0.6 oz (72.1 kg)     Other studies Reviewed: Echocardiogram 2011-10-22 Left ventricle: The cavity size was normal. Wall thickness was increased in a pattern of moderate LVH. Systolic function was normal. The estimated ejection fraction was in the range of 55% to 60%. Wall motion was normal; there were no regional wall motion abnormalities. - Left atrium: The atrium was mildly dilated. - Atrial septum: No defect or patent foramen ovale was identified.  ASSESSMENT AND PLAN:  1. Paroxysmal RVOT PVC's: She has been on atenolol and prn propanolol which she reports that is not helping with her symptoms. I will try diltiazem 30 mg TID as trial to evaluate if this is helping her symptoms. She will reduce ramipril to 10 mg daily and can take another 10 mg  if BP remains higher. With addition of CCB do not want to cause hypotension. She will have a 14 day Zio placed to evaluate HR response to new regimen. She is to take her BP daily and record this.   2. Hypertension: BP is elevated today despite taking atenolol and ramipril As above changing to diltiazem and reducing ramipril to 10 mg daily from 20 mg daily unless BP rises. She is to keep track of this and record.    Current medicines are reviewed at length with the patient today.    Labs/ tests ordered today include: 14 day Lina Sar. [place] Pugh, ANP, AACC   08/07/2018 9:29 AM    [facility] [place] [phone] Fax [phone]

## 2018-08-07 ENCOUNTER — Encounter: Payer: Self-pay | Admitting: Adult Health

## 2018-08-07 ENCOUNTER — Ambulatory Visit: Payer: PPO | Admitting: Adult Health

## 2018-08-07 VITALS — BP 156/82 | HR 74 | Ht 65.0 in | Wt 156.0 lb

## 2018-08-07 DIAGNOSIS — R002 Palpitations: Secondary | ICD-10-CM

## 2018-08-07 DIAGNOSIS — I472 Ventricular tachycardia: Secondary | ICD-10-CM | POA: Diagnosis not present

## 2018-08-07 DIAGNOSIS — I4729 Other ventricular tachycardia: Secondary | ICD-10-CM

## 2018-08-07 DIAGNOSIS — I10 Essential (primary) hypertension: Secondary | ICD-10-CM

## 2018-08-07 MED ORDER — DILTIAZEM HCL 30 MG PO TABS: 30.0000 mg | ORAL_TABLET | Freq: Three times a day (TID) | ORAL | 1 refills | Status: DC

## 2018-08-07 NOTE — Patient Instructions (Signed)
Medication Instructions:  STOP ATENOLOL  STOP PROPRANOLOL  START DILTIAZEM 30MG  THREE TIMES DAILY  If you need a refill on your cardiac medications before your next appointment, please call your pharmacy.  Testing/Procedures: Your physician has recommended that you wear a 7 DAY ZIO-PATCH monitor. The Zio patch cardiac monitor continuously records heart rhythm data for up to 14 days, this is for patients being evaluated for multiple types heart rhythms. For the first 24 hours post application, please avoid getting the Zio monitor wet in the shower or by excessive sweating during exercise. After that, feel free to carry on with regular activities. Keep soaps and lotions away from the ZIO XT Patch.  This will be placed at our [facility] location - [address].       Special Instructions: PLEASE PURCHASE AND WEAR COMPRESSION STOCKINGS DAILY AND OFF AT BEDTIME. Compression stockings are elastic socks that squeeze the legs. They help to increase blood flow to the legs and to decrease swelling in the legs from fluid retention, and reduce the chance of developing blood clots in the lower legs.   Your physician has requested that you regularly monitor and record your blood pressure readings at home. Please use the same machine at the same time of day to check your readings and record them to bring to your follow-up visit.  Follow-Up: You will need a follow up appointment in 2 WEEKS.  You may see  DR Ellyn Hack or one of the following Advanced Practice Providers on your designated Care Team:   . Jory Sims, DNP, ANP . Rosaria Ferries, PA-C  Labwork: If you have labs (blood work) drawn today and your tests are completely normal, you will receive your results only by: Marland Kitchen MyChart Message (if you have MyChart) OR . A paper copy in the mail If you have any lab test that is abnormal or we need to change your treatment, we will call you to review the results.  At [facility], you and  your health needs are our priority.  As part of our continuing mission to provide you with exceptional heart care, we have created designated Provider Care Teams.  These Care Teams include your primary Cardiologist (physician) and Advanced Practice Providers (APPs -  Physician Assistants and Nurse Practitioners) who all work together to provide you with the care you need, when you need it.  Thank you for choosing [facility] at [facility]!!

## 2018-08-13 ENCOUNTER — Telehealth: Payer: Self-pay | Admitting: Cardiology

## 2018-08-13 ENCOUNTER — Ambulatory Visit (INDEPENDENT_AMBULATORY_CARE_PROVIDER_SITE_OTHER): Payer: PPO

## 2018-08-13 DIAGNOSIS — R002 Palpitations: Secondary | ICD-10-CM

## 2018-08-13 DIAGNOSIS — I472 Ventricular tachycardia: Secondary | ICD-10-CM | POA: Diagnosis not present

## 2018-08-13 DIAGNOSIS — I4729 Other ventricular tachycardia: Secondary | ICD-10-CM

## 2018-08-13 NOTE — Telephone Encounter (Signed)
New message   Pt c/o medication issue:  1. Name of Medication: diltiazem (CARDIZEM) 30 MG tablet  2. How are you currently taking this medication (dosage and times per day)? 3 times daily  3. Are you having a reaction (difficulty breathing--STAT)? No   4. What is your medication issue? Patient states that she feels off balance, pressure in head, blood pressure is up and down, hr is 103 on today.

## 2018-08-13 NOTE — Telephone Encounter (Signed)
Agree, thank you

## 2018-08-13 NOTE — Telephone Encounter (Signed)
Called patient back about her message. Patient complaining of feeling off balance, head feeling heavy, elevated BP (162/82), pressure in her head, and elevated HR (103). Patient stated she has been taking Diltiazem 30 mg with meals. Informed patient that she needs to take her Diltiazem every 8 hours, that she is probably taking her Diltiazem to close together. Patient wanted to know what she can take for her BP. Asked patient if she had taken her Ramipril. Patient stated no. Encouraged patient to take her Ramipril 10 mg and if her BP is still elevated that she can take another 10 mg, per Jory Sims NP note on 08/07/18. Patient verbalized understanding. Patient will start taking her Diltiazem every 8 hours. Patient will take her Ramipiril 10 mg now, and if her BP is still elevated she will take another 10 mg.

## 2018-08-22 HISTORY — PX: NM MYOVIEW LTD: HXRAD82

## 2018-08-22 HISTORY — PX: TRANSTHORACIC ECHOCARDIOGRAM: SHX275

## 2018-08-24 DIAGNOSIS — R002 Palpitations: Secondary | ICD-10-CM | POA: Diagnosis not present

## 2018-08-27 ENCOUNTER — Other Ambulatory Visit: Payer: Self-pay

## 2018-08-27 DIAGNOSIS — I493 Ventricular premature depolarization: Secondary | ICD-10-CM

## 2018-08-27 DIAGNOSIS — I472 Ventricular tachycardia: Secondary | ICD-10-CM

## 2018-08-27 DIAGNOSIS — I4729 Other ventricular tachycardia: Secondary | ICD-10-CM

## 2018-08-28 ENCOUNTER — Ambulatory Visit: Payer: PPO | Admitting: Cardiology

## 2018-08-28 ENCOUNTER — Encounter: Payer: Self-pay | Admitting: Cardiology

## 2018-08-28 VITALS — BP 146/77 | HR 88 | Ht 65.5 in | Wt 156.0 lb

## 2018-08-28 DIAGNOSIS — I493 Ventricular premature depolarization: Secondary | ICD-10-CM

## 2018-08-28 DIAGNOSIS — I119 Hypertensive heart disease without heart failure: Secondary | ICD-10-CM

## 2018-08-28 DIAGNOSIS — I10 Essential (primary) hypertension: Secondary | ICD-10-CM

## 2018-08-28 DIAGNOSIS — R008 Other abnormalities of heart beat: Secondary | ICD-10-CM | POA: Diagnosis not present

## 2018-08-28 MED ORDER — ATENOLOL 25 MG PO TABS: 25.0000 mg | ORAL_TABLET | Freq: Two times a day (BID) | ORAL | 3 refills | Status: DC

## 2018-08-28 MED ORDER — DILTIAZEM HCL 30 MG PO TABS: 30.0000 mg | ORAL_TABLET | Freq: Three times a day (TID) | ORAL | 1 refills | Status: DC | PRN

## 2018-08-28 NOTE — Patient Instructions (Addendum)
Medication Instructions:  Restart  Ramipril 10 mg one tablet a day Restart atenolol  25 mg twice a day   may use  Diltiazem  As need for frequent episodes of palpitaions   If you need a refill on your cardiac medications before your next appointment, please call your pharmacy.   Lab work: NOT  NEEDED  If you have labs (blood work) drawn today and your tests are completely normal, you will receive your results only by: Marland Kitchen MyChart Message (if you have MyChart) OR . A paper copy in the mail If you have any lab test that is abnormal or we need to change your treatment, we will call you to review the results.  Testing/Procedures: SCHEDULE AT [place] has requested that you have an echocardiogram. Echocardiography is a painless test that uses sound waves to create images of your heart. It provides your doctor with information about the size and shape of your heart and how well your heart's chambers and valves are working. This procedure takes approximately one hour. There are no restrictions for this procedure.  AND  SCHEDULE AT [place] Your physician has requested that you have a lexiscan myoview. For further information please visit [email or web address]. Please follow instruction sheet, as given.      Follow-Up: At [facility], you and your health needs are our priority.  As part of our continuing mission to provide you with exceptional heart care, we have created designated Provider Care Teams.  These Care Teams include your primary Cardiologist (physician) and Advanced Practice Providers (APPs -  Physician Assistants and Nurse Practitioners) who all work together to provide you with the care you need, when you need it. . Your physician recommends that you schedule a follow-up appointment in 2 [place]. .   Any Other Special Instructions Will Be Listed Below (If Applicable).  You have been referred to  [place]

## 2018-08-28 NOTE — Progress Notes (Signed)
PCP: Hoyt Koch, MD  Clinic Note: Chief Complaint  Patient presents with  . Follow-up    DISCUSS MONITOR  . Palpitations    Significant PVCs on monitor    HPI: Lisa Cox is a 72 y.o. female who is being seen today for six-month follow-up evaluation of frequent PVCs at the request of Hoyt Koch, *. Lisa Cox is a former patient of Dr. Warren Danes whom he last saw on 12/19/2015.  Lisa Cox was last seen on May 06, 2017 to review the results of her monitor that showed RVOT PVCs.  She had had a GXT done unfortunately off of beta-blockers which showed frequent monomorphic RVOT pattern PVCs.  She had not had to use that much in the way of any as needed propranolol.  Saw KL 10/17 - Diltiazem 30 mg TID.  Recent Hospitalizations: n/a  Studies Personally Reviewed - (if available, images/films reviewed: From Epic Chart or Care Everywhere)  Cardiac Event Monitor October 2019: Predominantly sinus rhythm with first-degree block. Minimum heart rate 63 bpm. Maximum heart rate 139 bpm. Average is 85 bpm. 17.5% PVCs.  V bigem & Trigem noted.    Interval History: Lisa Cox returns today noticing that she really does not feel as well taking the diltiazem that she did when she was taking the atenolol.  She still feels palpitations off and on but has not had any real dyspnea or dizziness associated with them.  She says that she still has bursts of time when she has significant palpitations but of late they have not been as bad, she just does not feel well taking the diltiazem. She is not had any chest pain or pressure with rest or exertion she has not had any spells where she feels like her heart is racing for prolonged period time.  She is not noticing any PND, orthopnea or edema.  She does have some exertional dyspnea but not anything out of the ordinary. No syncope or near syncope, TIA or amaurosis fugax. She said that for the most part her palpitations have been  controlled with atenolol she had not had to use much in the way of any Inderal.  The Inderal/propranolol did not seem to help when she did use it.  ROS: A comprehensive was performed. Review of Systems  Constitutional: Negative for malaise/fatigue.  HENT: Negative for congestion and nosebleeds.   Respiratory: Positive for shortness of breath (Baseline).   Cardiovascular:       Per history of present illness  Gastrointestinal: Negative for abdominal pain, blood in stool, heartburn, melena and nausea.  Genitourinary: Negative for hematuria.  Musculoskeletal: Positive for joint pain. Negative for myalgias.  Neurological: Negative for dizziness.  Endo/Heme/Allergies: Negative for environmental allergies.  Psychiatric/Behavioral: The patient is nervous/anxious.   All other systems reviewed and are negative.  I have reviewed and (if needed) personally updated the patient's problem list, medications, allergies, past medical and surgical history, social and family history.   Past Medical History:  Diagnosis Date  . Anxiety   . Bell's palsy 2009  . Cancer ([place]) Feb.2011   breast,lumpectomy right side followed by radiation  . Depression   . GERD (gastroesophageal reflux disease)   . Hyperlipidemia   . Hypertension   . Palpitations    Frequent PVCs with some bigeminy noted on 48-hour monitor. No arrhythmias.  . Personal history of radiation therapy     Past Surgical History:  Procedure Laterality Date  . BREAST LUMPECTOMY  Feb. 2011  right breast,followed by radiation therapy  . CARDIAC EVENT MONITOR     Predominantly sinus rhythm with first-degree block. Minimum heart rate 63 bpm. Maximum heart rate 139 bpm. Average is 85 bpm. 17.5% PVCs.  V bigem & Trigem noted.    Marland Kitchen EYE SURGERY  1953   skin removal in eye  . GXT  04/2017   Exercise tolerance test off of beta-blockers: Normal blood pressure response to exercise.  No EKG changes to suggest ischemic changes.  Frequent monomorphic  PVCs with likely RVOT origin.  Seen at rest, exercise and recovery.  . TUBAL LIGATION  1978    Current Meds  Medication Sig  . acetaminophen (TYLENOL) 500 MG tablet Take 1,000 mg by mouth every 6 (six) hours as needed. For cramping.  Marland Kitchen aspirin 81 MG tablet Take 81 mg by mouth daily as needed (blood thinner). Does not take on a daily basis  . b complex vitamins tablet Take 1 tablet by mouth daily as needed (supplement).   . Calcium Citrate-Vitamin D (CALCIUM + D PO) Take 2 tablets by mouth daily.  . carboxymethylcellulose (REFRESH TEARS) 0.5 % SOLN Place 1 drop into both eyes 3 (three) times daily as needed (dry eye).   Marland Kitchen diltiazem (CARDIZEM) 30 MG tablet Take 1 tablet (30 mg total) by mouth 3 (three) times daily as needed.  . dimenhyDRINATE (DRAMAMINE) 50 MG tablet Take 50 mg by mouth every 8 (eight) hours as needed. Reported on 04/19/2016  . famotidine (PEPCID) 10 MG tablet Take 10 mg by mouth at bedtime.  Marland Kitchen LORazepam (ATIVAN) 1 MG tablet TAKE 1 TABLET BY MOUTH EVERYDAY AT BEDTIME  . Multiple Vitamins-Minerals (PRESERVISION AREDS 2) CAPS Take 2 capsules by mouth daily.  . Omega-3 Fatty Acids (FISH OIL CONCENTRATE PO) Take 1 capsule by mouth daily.   . phenazopyridine (PYRIDIUM) 100 MG tablet Take 1 tablet (100 mg total) by mouth 3 (three) times daily as needed for pain.  . ramipril (ALTACE) 10 MG capsule TAKE 2 CAPSULES BY MOUTH EVERY DAY  . [DISCONTINUED] diltiazem (CARDIZEM) 30 MG tablet Take 1 tablet (30 mg total) by mouth 3 (three) times daily.  - stopped Bactrim after UTI  Allergies  Allergen Reactions  . Amlodipine     Peripheral edema  . Crestor [Rosuvastatin Calcium]     Leg cramps  . Erythromycin     nausea  . Macrobid [Nitrofurantoin Monohyd Macro] Nausea And Vomiting  . Niacin And Related Itching  . Restoril     Reaction-"nervous"  . Zolpidem Tartrate     Reaction-"nervous"    Social History   Tobacco Use  . Smoking status: Never Smoker  . Smokeless tobacco: Never  Used  Substance Use Topics  . Alcohol use: No  . Drug use: No   Social History   Social History Narrative  . Not on file   Family History family history includes Breast cancer (age of onset: 75) in her paternal aunt; Cancer in her father; Heart attack in her father; Heart disease in her father; Hypertension in her brother and sister; Stroke in her maternal aunt.  Wt Readings from Last 3 Encounters:  08/28/18 156 lb (70.8 kg)  08/07/18 156 lb (70.8 kg)  05/30/18 157 lb (71.2 kg)    PHYSICAL EXAM BP (!) 146/77   Pulse 88   Ht 5' 5.5" (1.664 m)   Wt 156 lb (70.8 kg)   BMI 25.56 kg/m   Physical Exam  Constitutional: She is oriented to person, place, and  time. She appears well-developed and well-nourished. No distress.  Well-groomed.  Healthy-appearing  HENT:  Head: Normocephalic and atraumatic.  Neck: Normal range of motion. Neck supple. No hepatojugular reflux and no JVD present. Carotid bruit is not present.  Cardiovascular: Normal rate, regular rhythm, normal heart sounds and normal pulses. Frequent extrasystoles are present. PMI is not displaced. Exam reveals no gallop and no friction rub.  No murmur heard. Pulmonary/Chest: Breath sounds normal. No respiratory distress. She has no wheezes. She has no rales.  Musculoskeletal: Normal range of motion. She exhibits no edema.  Neurological: She is alert and oriented to person, place, and time. No cranial nerve deficit.  Psychiatric: She has a normal mood and affect. Her behavior is normal. Judgment and thought content normal.  Nursing note and vitals reviewed.   Adult ECG Report n/a  Other studies Reviewed: Additional studies/ records that were reviewed today include:  Recent Labs:  Managed by PCP Lab Results  Component Value Date   CREATININE 0.99 05/30/2018   BUN 11 05/30/2018   NA 137 05/30/2018   K 4.2 05/30/2018   CL 100 05/30/2018   CO2 31 05/30/2018   Lab Results  Component Value Date   CHOL 186 07/12/2017     HDL 39.10 07/12/2017   LDLCALC 122 (H) 07/12/2017   TRIG 127.0 07/12/2017   CHOLHDL 5 07/12/2017    ASSESSMENT / PLAN: Problem List Items Addressed This Visit    Benign hypertensive heart disease without heart failure (Chronic)    Seems to be relatively euvolemic on exam.  Has not had a diuretic requirement.  We will put her back on beta-blocker and ACE inhibitor.  Recheck 2D echocardiogram.      Relevant Medications   diltiazem (CARDIZEM) 30 MG tablet   atenolol (TENORMIN) 25 MG tablet   Essential hypertension (Chronic)    Blood pressure is little bit higher today than usual.  She was not sure what to do with a random pill so we will restart ramipril and atenolol, DC diltiazem and use the diltiazem as PRN is as opposed to Inderal.      Relevant Medications   diltiazem (CARDIZEM) 30 MG tablet   atenolol (TENORMIN) 25 MG tablet   Frequent unifocal PVCs - Primary (Chronic)    PVCs appear to be RVOT in nature.  Have been well controlled with atenolol in the past.  She does not feel that well with diltiazem.  There is some thought that the calcium channel blockers may be effective.  However symptomatically she just does not feel well. Plan, return back to her previous dosing of atenolol with as needed use of diltiazem as opposed to Inderal.  Given high frequency (17%) PVCs on monitor, have to be concerned that this could be potentially an ischemic etiology.  --Given the extent of PVCs, I am concerned of the long-term effect.  Will refer to electrophysiology to discuss other potential treatment options versus monitoring etc.  Plan: check 2D echocardiogram and Myoview stress test to exclude any structural normality or ischemic etiology.      Relevant Medications   diltiazem (CARDIZEM) 30 MG tablet   atenolol (TENORMIN) 25 MG tablet   Other Relevant Orders   ECHOCARDIOGRAM COMPLETE   Ambulatory referral to Cardiology   Ventricular bigeminy seen on cardiac monitor (Chronic)    In  the past she had a treadmill test that did not show worsening PVCs on exertion, however with a significant amount noted, need to exclude ischemic etiology more definitively. Plan:  Lexiscan Myoview and 2D echo.      Relevant Medications   diltiazem (CARDIZEM) 30 MG tablet   atenolol (TENORMIN) 25 MG tablet   Other Relevant Orders   ECHOCARDIOGRAM COMPLETE   Ambulatory referral to Cardiology      Current medicines are reviewed at length with the patient today. (+/- concerns) n/a The following changes have been made: No change  Patient Instructions  Medication Instructions:  Restart  Ramipril 10 mg one tablet a day Restart atenolol  25 mg twice a day   may use  Diltiazem  As need for frequent episodes of palpitaions   If you need a refill on your cardiac medications before your next appointment, please call your pharmacy.   Lab work: NOT  NEEDED  If you have labs (blood work) drawn today and your tests are completely normal, you will receive your results only by: Marland Kitchen MyChart Message (if you have MyChart) OR . A paper copy in the mail If you have any lab test that is abnormal or we need to change your treatment, we will call you to review the results.  Testing/Procedures: SCHEDULE AT [place] has requested that you have an echocardiogram. Echocardiography is a painless test that uses sound waves to create images of your heart. It provides your doctor with information about the size and shape of your heart and how well your heart's chambers and valves are working. This procedure takes approximately one hour. There are no restrictions for this procedure.  AND  SCHEDULE AT [place] Your physician has requested that you have a lexiscan myoview. For further information please visit [email or web address]. Please follow instruction sheet, as given.      Follow-Up: At [facility], you and your health needs are our  priority.  As part of our continuing mission to provide you with exceptional heart care, we have created designated Provider Care Teams.  These Care Teams include your primary Cardiologist (physician) and Advanced Practice Providers (APPs -  Physician Assistants and Nurse Practitioners) who all work together to provide you with the care you need, when you need it. . Your physician recommends that you schedule a follow-up appointment in 2 [place]. .   Any Other Special Instructions Will Be Listed Below (If Applicable).  You have been referred to  [facility] ELECTROPHYSIOLOGIST       Studies Ordered:   Orders Placed This Encounter  Procedures  . Ambulatory referral to Cardiology  . ECHOCARDIOGRAM COMPLETE  [facility]    Glenetta Hew, M.D., M.S. Interventional Cardiologist   Pager # [phone] Phone # [phone] [address] [place], [place] [address]

## 2018-08-29 ENCOUNTER — Other Ambulatory Visit: Payer: Self-pay | Admitting: Adult Health

## 2018-08-30 ENCOUNTER — Encounter: Payer: Self-pay | Admitting: Cardiology

## 2018-08-30 NOTE — Assessment & Plan Note (Signed)
In the past she had a treadmill test that did not show worsening PVCs on exertion, however with a significant amount noted, need to exclude ischemic etiology more definitively. Plan: Lexiscan Myoview and 2D echo.

## 2018-08-30 NOTE — Assessment & Plan Note (Signed)
Blood pressure is little bit higher today than usual.  She was not sure what to do with a random pill so we will restart ramipril and atenolol, DC diltiazem and use the diltiazem as PRN is as opposed to Inderal.

## 2018-08-30 NOTE — Assessment & Plan Note (Addendum)
PVCs appear to be RVOT in nature.  Have been well controlled with atenolol in the past.  She does not feel that well with diltiazem.  There is some thought that the calcium channel blockers may be effective.  However symptomatically she just does not feel well. Plan, return back to her previous dosing of atenolol with as needed use of diltiazem as opposed to Inderal.  Given high frequency (17%) PVCs on monitor, have to be concerned that this could be potentially an ischemic etiology.  --Given the extent of PVCs, I am concerned of the long-term effect.  Will refer to electrophysiology to discuss other potential treatment options versus monitoring etc.  Plan: check 2D echocardiogram and Myoview stress test to exclude any structural normality or ischemic etiology.

## 2018-08-30 NOTE — Assessment & Plan Note (Signed)
Seems to be relatively euvolemic on exam.  Has not had a diuretic requirement.  We will put her back on beta-blocker and ACE inhibitor.  Recheck 2D echocardiogram.

## 2018-09-02 ENCOUNTER — Encounter (HOSPITAL_COMMUNITY): Payer: PPO

## 2018-09-02 ENCOUNTER — Ambulatory Visit (HOSPITAL_COMMUNITY): Payer: PPO | Attending: Cardiology

## 2018-09-02 ENCOUNTER — Other Ambulatory Visit: Payer: Self-pay

## 2018-09-02 DIAGNOSIS — I493 Ventricular premature depolarization: Secondary | ICD-10-CM | POA: Diagnosis not present

## 2018-09-02 DIAGNOSIS — R008 Other abnormalities of heart beat: Secondary | ICD-10-CM | POA: Diagnosis not present

## 2018-09-09 ENCOUNTER — Telehealth (HOSPITAL_COMMUNITY): Payer: Self-pay

## 2018-09-09 NOTE — Telephone Encounter (Signed)
Encounter complete. 

## 2018-09-11 ENCOUNTER — Ambulatory Visit (HOSPITAL_COMMUNITY)
Admission: RE | Admit: 2018-09-11 | Discharge: 2018-09-11 | Disposition: A | Discharge status: Home / Self Care | Payer: PPO | Source: Ambulatory Visit | Attending: Cardiovascular Disease | Admitting: Cardiovascular Disease

## 2018-09-11 DIAGNOSIS — I493 Ventricular premature depolarization: Secondary | ICD-10-CM | POA: Diagnosis not present

## 2018-09-11 DIAGNOSIS — I4729 Other ventricular tachycardia: Secondary | ICD-10-CM

## 2018-09-11 DIAGNOSIS — I472 Ventricular tachycardia: Secondary | ICD-10-CM | POA: Diagnosis not present

## 2018-09-11 LAB — MYOCARDIAL PERFUSION IMAGING
CHL CUP NUCLEAR SRS: 0
CHL CUP NUCLEAR SSS: 1
CSEPPHR: 104 {beats}/min
Rest HR: 65 {beats}/min
SDS: 1
TID: 1.35

## 2018-09-11 MED ORDER — AMINOPHYLLINE 25 MG/ML IV SOLN
75.0000 mg | Freq: Once | INTRAVENOUS | Status: AC
  Administered 2018-09-11: 75 mg via INTRAVENOUS

## 2018-09-11 MED ORDER — TECHNETIUM TC 99M TETROFOSMIN IV KIT
31.1000 | PACK | Freq: Once | INTRAVENOUS | Status: AC | PRN
  Administered 2018-09-11: 31.1 via INTRAVENOUS
  Filled 2018-09-11: qty 32

## 2018-09-11 MED ORDER — REGADENOSON 0.4 MG/5ML IV SOLN
0.4000 mg | Freq: Once | INTRAVENOUS | Status: AC
  Administered 2018-09-11: 0.4 mg via INTRAVENOUS

## 2018-09-11 MED ORDER — TECHNETIUM TC 99M TETROFOSMIN IV KIT
9.9000 | PACK | Freq: Once | INTRAVENOUS | Status: AC | PRN
  Administered 2018-09-11: 9.9 via INTRAVENOUS
  Filled 2018-09-11: qty 10

## 2018-09-15 ENCOUNTER — Encounter: Payer: Self-pay | Admitting: *Deleted

## 2018-09-16 ENCOUNTER — Ambulatory Visit (INDEPENDENT_AMBULATORY_CARE_PROVIDER_SITE_OTHER): Payer: PPO | Admitting: Cardiology

## 2018-09-16 ENCOUNTER — Encounter: Payer: Self-pay | Admitting: Cardiology

## 2018-09-16 VITALS — BP 126/70 | HR 76 | Ht 66.0 in | Wt 157.0 lb

## 2018-09-16 DIAGNOSIS — I493 Ventricular premature depolarization: Secondary | ICD-10-CM

## 2018-09-16 DIAGNOSIS — I10 Essential (primary) hypertension: Secondary | ICD-10-CM

## 2018-09-16 MED ORDER — FLECAINIDE ACETATE 100 MG PO TABS: 100.0000 mg | ORAL_TABLET | Freq: Two times a day (BID) | ORAL | 3 refills | Status: DC

## 2018-09-16 NOTE — Progress Notes (Signed)
Demetrios Isaacs - figured that she needs more control.  Glenetta Hew, MD

## 2018-09-16 NOTE — Patient Instructions (Addendum)
Medication Instructions:  Your physician has recommended you make the following change in your medication:  1. START Flecainide 100 mg twice daily -- you will start this medication one week before the stress test  * If you need a refill on your cardiac medications before your next appointment, please call your pharmacy.   Labwork: None ordered  Testing/Procedures: Your physician has requested that you have an exercise tolerance test - you will start your Flecainide one week before this testing. For further information please visit [email or web address]. Please also follow instruction sheet, as given.  Follow-Up: Your physician recommends that you schedule a follow-up appointment in: 3 months with Dr. Curt Bears.  *Please note that any paperwork needing to be filled out by the provider will need to be addressed at the front desk prior to seeing the provider. Please note that any FMLA, disability or other documents regarding health condition is subject to a $25.00 charge that must be received prior to completion of paperwork in the form of a money order or check.  Thank you for choosing [facility]!!   Trinidad Curet, RN [phone]  Any Other Special Instructions Will Be Listed Below (If Applicable). Flecainide tablets What is this medicine? FLECAINIDE (FLEK a nide) is an antiarrhythmic drug. This medicine is used to prevent irregular heart rhythm. It can also slow down fast heartbeats called tachycardia. This medicine may be used for other purposes; ask your health care provider or pharmacist if you have questions. COMMON BRAND NAME(S): Tambocor What should I tell my health care provider before I take this medicine? They need to know if you have any of these conditions: -abnormal levels of potassium in the blood -heart disease including heart rhythm and heart rate problems -kidney or liver disease -recent heart attack -an unusual or allergic reaction to flecainide, local  anesthetics, other medicines, foods, dyes, or preservatives -pregnant or trying to get pregnant -breast-feeding How should I use this medicine? Take this medicine by mouth with a glass of water. Follow the directions on the prescription label. You can take this medicine with or without food. Take your doses at regular intervals. Do not take your medicine more often than directed. Do not stop taking this medicine suddenly. This may cause serious, heart-related side effects. If your doctor wants you to stop the medicine, the dose may be slowly lowered over time to avoid any side effects. Talk to your pediatrician regarding the use of this medicine in children. While this drug may be prescribed for children as young as 1 year of age for selected conditions, precautions do apply. Overdosage: If you think you have taken too much of this medicine contact a poison control center or emergency room at once. NOTE: This medicine is only for you. Do not share this medicine with others. What if I miss a dose? If you miss a dose, take it as soon as you can. If it is almost time for your next dose, take only that dose. Do not take double or extra doses. What may interact with this medicine? Do not take this medicine with any of the following medications: -amoxapine -arsenic trioxide -certain antibiotics like clarithromycin, erythromycin, gatifloxacin, gemifloxacin, levofloxacin, moxifloxacin, sparfloxacin, or troleandomycin -certain antidepressants called tricyclic antidepressants like amitriptyline, imipramine, or nortriptyline -certain medicines to control heart rhythm like disopyramide, dofetilide, encainide, moricizine, procainamide, propafenone, and quinidine -cisapride -cyclobenzaprine -delavirdine -droperidol -haloperidol -haw[place]orn -imatinib -levomethadyl -maprotiline -medicines for malaria like chloroquine and halofantrine -pentamidine -phenothiazines like chlorpromazine, mesoridazine,  prochlorperazine, thioridazine -pimozide -quinine -  ranolazine -ritonavir -sertindole -ziprasidone This medicine may also interact with the following medications: -cimetidine -medicines for angina or high blood pressure -medicines to control heart rhythm like amiodarone and digoxin This list may not describe all possible interactions. Give your health care provider a list of all the medicines, herbs, non-prescription drugs, or dietary supplements you use. Also tell them if you smoke, drink alcohol, or use illegal drugs. Some items may interact with your medicine. What should I watch for while using this medicine? Visit your doctor or health care professional for regular checks on your progress. Because your condition and the use of this medicine carries some risk, it is a good idea to carry an identification card, necklace or bracelet with details of your condition, medications and doctor or health care professional. Check your blood pressure and pulse rate regularly. Ask your health care professional what your blood pressure and pulse rate should be, and when you should contact him or her. Your doctor or health care professional also may schedule regular blood tests and electrocardiograms to check your progress. You may get drowsy or dizzy. Do not drive, use machinery, or do anything that needs mental alertness until you know how this medicine affects you. Do not stand or sit up quickly, especially if you are an older patient. This reduces the risk of dizzy or fainting spells. Alcohol can make you more dizzy, increase flushing and rapid heartbeats. Avoid alcoholic drinks. What side effects may I notice from receiving this medicine? Side effects that you should report to your doctor or health care professional as soon as possible: -chest pain, continued irregular heartbeats -difficulty breathing -swelling of the legs or feet -trembling, shaking -unusually weak or tired Side effects that usually  do not require medical attention (report to your doctor or health care professional if they continue or are bothersome): -blurred vision -constipation -headache -nausea, vomiting -stomach pain This list may not describe all possible side effects. Call your doctor for medical advice about side effects. You may report side effects to FDA at 1-800-FDA-1088. Where should I keep my medicine? Keep out of the reach of children. Store at room temperature between 15 and 30 degrees C (59 and 86 degrees F). Protect from light. Keep container tightly closed. Throw away any unused medicine after the expiration date. NOTE: This sheet is a summary. It may not cover all possible information. If you have questions about this medicine, talk to your doctor, pharmacist, or health care provider.  2018 Elsevier/Gold Standard (2008-02-11 16:46:09)

## 2018-09-16 NOTE — Progress Notes (Addendum)
Electrophysiology Office Note   Date:  09/16/2018   ID:  Lisa Cox, DOB 12/19/1945, MRN [id number]  PCP:  Hoyt Koch, MD  Cardiologist:  Ellyn Hack Primary Electrophysiologist:  Hailly Fess Meredith Leeds, MD    No chief complaint on file.    History of Present Illness: Lisa Cox is a 72 y.o. female who is being seen today for the evaluation of PVCs at the request of Lisa Cox. Presenting today for electrophysiology evaluation.  Is a history of hypertension, hyperlipidemia, and palpitations.  She was found to have a high burden of PVCs.  She was put on diltiazem, but did not tolerate the medication.  She continues to have palpitations, but minimal dyspnea or dizziness.  She is back on her atenolol after wearing her monitor.  Her PVC burden has gone down since starting the atenolol.  She does get mildly short of breath when she exerts herself, and she feels that her PVCs may contribute to this.  Today, she denies symptoms of palpitations, chest pain, shortness of breath, orthopnea, PND, lower extremity edema, claudication, dizziness, presyncope, syncope, bleeding, or neurologic sequela. The patient is tolerating medications without difficulties.    Past Medical History:  Diagnosis Date  . Anxiety   . Bell's palsy 2009  . Cancer ([place]) Feb.2011   breast,lumpectomy right side followed by radiation  . Depression   . GERD (gastroesophageal reflux disease)   . Hyperlipidemia   . Hypertension   . Palpitations    Frequent PVCs with some bigeminy noted on 48-hour monitor. No arrhythmias.  . Personal history of radiation therapy    Past Surgical History:  Procedure Laterality Date  . BREAST LUMPECTOMY  Feb. 2011   right breast,followed by radiation therapy  . CARDIAC EVENT MONITOR     Predominantly sinus rhythm with first-degree block. Minimum heart rate 63 bpm. Maximum heart rate 139 bpm. Average is 85 bpm. 17.5% PVCs.  V bigem & Trigem noted.    Marland Kitchen EYE SURGERY  1953     skin removal in eye  . GXT  04/2017   Exercise tolerance test off of beta-blockers: Normal blood pressure response to exercise.  No EKG changes to suggest ischemic changes.  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  Seen at rest, exercise and recovery.  . TUBAL LIGATION  1978     Current Outpatient Medications  Medication Sig Dispense Refill  . acetaminophen (TYLENOL) 500 MG tablet Take 1,000 mg by mouth every 6 (six) hours as needed. For cramping.    Marland Kitchen aspirin 81 MG tablet Take 81 mg by mouth daily as needed (blood thinner). Does not take on a daily basis    . atenolol (TENORMIN) 25 MG tablet Take 1 tablet (25 mg total) by mouth 2 (two) times daily. 180 tablet 3  . b complex vitamins tablet Take 1 tablet by mouth daily as needed (supplement).     . Calcium Citrate-Vitamin D (CALCIUM + D PO) Take 2 tablets by mouth daily.    . carboxymethylcellulose (REFRESH TEARS) 0.5 % SOLN Place 1 drop into both eyes 3 (three) times daily as needed (dry eye).     Marland Kitchen diltiazem (CARDIZEM) 30 MG tablet Take 1 tablet (30 mg total) by mouth 3 (three) times daily as needed. 90 tablet 1  . dimenhyDRINATE (DRAMAMINE) 50 MG tablet Take 50 mg by mouth every 8 (eight) hours as needed. Reported on 04/19/2016    . famotidine (PEPCID) 10 MG tablet Take 10 mg by mouth  at bedtime.    Marland Kitchen LORazepam (ATIVAN) 1 MG tablet TAKE 1 TABLET BY MOUTH EVERYDAY AT BEDTIME 30 tablet 2  . Multiple Vitamins-Minerals (PRESERVISION AREDS 2) CAPS Take 2 capsules by mouth daily.    . Omega-3 Fatty Acids (FISH OIL CONCENTRATE PO) Take 1 capsule by mouth daily.     . phenazopyridine (PYRIDIUM) 100 MG tablet Take 1 tablet (100 mg total) by mouth 3 (three) times daily as needed for pain. 10 tablet 0  . ramipril (ALTACE) 10 MG capsule TAKE 2 CAPSULES BY MOUTH EVERY DAY 60 capsule 7  . flecainide (TAMBOCOR) 100 MG tablet Take 1 tablet (100 mg total) by mouth 2 (two) times daily. 60 tablet 3   No current facility-administered medications  for this visit.     Allergies:   Amlodipine; Crestor [rosuvastatin calcium]; Erythromycin; Macrobid [nitrofurantoin monohyd macro]; Niacin and related; Restoril; and Zolpidem tartrate   Social History:  The patient  reports that she has never smoked. She has never used smokeless tobacco. She reports that she does not drink alcohol or use drugs.   Family History:  The patient's family history includes Breast cancer (age of onset: 54) in her paternal aunt; Heart attack in her father; Heart disease in her father; Hypertension in her brother and sister; Pancreatic cancer in her father; Stroke in her maternal aunt.    ROS:  Please see the history of present illness.   Otherwise, review of systems is positive for none.   All other systems are reviewed and negative.    PHYSICAL EXAM: VS:  BP 126/70   Pulse 76   Ht 5\' 6"  (1.676 m)   Wt 157 lb (71.2 kg)   BMI 25.34 kg/m  , BMI Body mass index is 25.34 kg/m. GEN: Well nourished, well developed, in no acute distress  HEENT: normal  Neck: no JVD, carotid bruits, or masses Cardiac: RRR; no murmurs, rubs, or gallops,no edema  Respiratory:  clear to auscultation bilaterally, normal work of breathing GI: soft, nontender, nondistended, + BS MS: no deformity or atrophy  Skin: warm and dry Neuro:  Strength and sensation are intact Psych: euthymic mood, full affect  EKG:  EKG is ordered today. Personal review of the ekg ordered shows SR, rate 76  Recent Labs: 05/30/2018: ALT 13; BUN 11; Creatinine, Ser 0.99; Hemoglobin 13.0; Platelets 186.0; Potassium 4.2; Sodium 137; TSH 4.17    Lipid Panel     Component Value Date/Time   CHOL 186 07/12/2017 0857   TRIG 127.0 07/12/2017 0857   HDL 39.10 07/12/2017 0857   CHOLHDL 5 07/12/2017 0857   VLDL 25.4 07/12/2017 0857   LDLCALC 122 (H) 07/12/2017 0857     Wt Readings from Last 3 Encounters:  09/16/18 157 lb (71.2 kg)  09/11/18 156 lb (70.8 kg)  08/28/18 156 lb (70.8 kg)      Other studies  Reviewed: Additional studies/ records that were reviewed today include: TTE 09/02/18  Review of the above records today demonstrates:   - Left ventricle: The cavity size was normal. Systolic function was   normal. The estimated ejection fraction was in the range of 55%   to 60%. Wall motion was normal; there were no regional wall   motion abnormalities. Features are consistent with a pseudonormal   left ventricular filling pattern, with concomitant abnormal   relaxation and increased filling pressure (grade 2 diastolic   dysfunction). - Aortic valve: There was moderate regurgitation. Regurgitation   pressure half-time: 422 ms. - Mitral valve:  There was trivial regurgitation. - Right ventricle: Systolic function was normal. - Atrial septum: No defect or patent foramen ovale was identified. - Tricuspid valve: There was trivial regurgitation. - Pulmonic valve: There was no significant regurgitation.  SPECT 09/11/18  There was no ST segment deviation noted during stress.  The study is normal.  This is a low risk study with no evidence of perfusion abnormalities.  This study was not gated due to frequent ventricular ectopy.  Monitor 08/26/18 - personally reviewed  Predominantly sinus rhythm with first-degree block. Minimum heart rate 63 bpm. Maximum heart rate 139 bpm. Average is 85 bpm.  Very rare PACs noted. (<1%)  Frequent isolated PVCs (17.5%);  Ventricular trigeminy and bigeminy noted on diary.  No ventricular tachycardia or other arrhythmias. No pauses or bradycardia.   Significant amount of ventricular ectopy noted, but no arrhythmia.  ASSESSMENT AND PLAN:  1.  PVCs: Appear to be outflow tract in nature, likely RVOT.  Had previously been controlled with atenolol, did not tolerate diltiazem.  Is having 17% on cardiac monitor.  She is not having any PVCs on her EKG today.  I feel that likely medical management is the best option for her at this time.  We Kelsy Polack start her on  100 mg of flecainide.  Haru Shaff order an exercise treadmill test as well.  2.  Hypertension: Currently well controlled.    Current medicines are reviewed at length with the patient today.   The patient does not have concerns regarding her medicines.  The following changes were made today: Start Eliquis  Labs/ tests ordered today include:  Orders Placed This Encounter  Procedures  . Exercise Tolerance Test  . EKG 12-Lead   Case discussed with primary cardiology  Disposition:   FU with Herschell Virani 3 months  Signed, Abeer Deskins Meredith Leeds, MD  09/16/2018 10:02 AM     [facility] [address] [place] [address] [phone] (office) [phone] (fax)

## 2018-09-16 NOTE — Addendum Note (Signed)
Addended by: Stanton Kidney on: 09/16/2018 10:06 AM   Modules accepted: Orders

## 2018-09-25 ENCOUNTER — Ambulatory Visit: Payer: PPO

## 2018-10-03 ENCOUNTER — Telehealth: Payer: Self-pay | Admitting: Cardiology

## 2018-10-03 NOTE — Telephone Encounter (Signed)
Discussed her palpitations.  She is worried that PRN Diltiazem won't be enough. Informed, after discussion w/ Dr Curt Bears, to continue to monitor for now.  Explained that stressors of the holiday season may be a factor.  Explained that if she begins to have to take her PRN medication frequently and/or it isn't working to call and we would bring her in sooner if needed. Also informed that she can keep her f/u in March w/ [facility].  Advised that I will send a message to Dr. Ellyn Hack to do EKG day of her appt w/ him next month. Patient verbalized understanding and agreeable to plan.

## 2018-10-03 NOTE — Telephone Encounter (Signed)
° ° °  Patient calling to request medication for palpitations.  Please call

## 2018-10-03 NOTE — Telephone Encounter (Signed)
Notified Jennifer in billing, they will handle.

## 2018-10-03 NOTE — Telephone Encounter (Signed)
Per pt call - stated she would like to appeal  a denial on the heart monitor that was ordered Nov. 3rd 2019 (authorization is required and not on file for non participating provider. )  Patient would like a call back on what she can do with this process.

## 2018-10-11 ENCOUNTER — Telehealth: Payer: Self-pay | Admitting: Cardiology

## 2018-10-11 NOTE — Telephone Encounter (Signed)
Pt with freq PVCs, she has had a head cold and did take pseudoephedrine yesterday once.  Also using nasocort.  She tried an extra dilt today but did not help, she will take atenolol 25 mg now and see how this does.  She takes 25 mg two tabs every AM usually -  Her BP has been elevated up to 638 systolic.  Currently 156/83, she notes HR is 30-40s but this is with her PVCs.  She will take an extra atenolol 25 mg now.  If symptoms continue she will call us back.  I will send not to Dr. Curt Bears and Dr. Ellyn Hack.  She tried flecainide and did well until ETT and she developed widened QRS so flecainide stopped.

## 2018-10-13 NOTE — Telephone Encounter (Signed)
Pt going to continue to monitor.  Advised on when to call office back if PVCs remain persistent/worsen.  She understands her current sickness/medications could be exacerbating things. She is agreeable to plan and will call office if she feels she needs to be seen sooner.  She appreciates my follow up

## 2018-10-13 NOTE — Telephone Encounter (Signed)
She did not want to see me until she saw Ellyn Hack in January. If she needs to see me earlier happy to see her.

## 2018-10-31 ENCOUNTER — Ambulatory Visit: Payer: PPO | Admitting: Cardiology

## 2018-10-31 ENCOUNTER — Encounter: Payer: Self-pay | Admitting: Cardiology

## 2018-10-31 VITALS — BP 142/80 | HR 73 | Ht 65.5 in | Wt 157.0 lb

## 2018-10-31 DIAGNOSIS — I10 Essential (primary) hypertension: Secondary | ICD-10-CM

## 2018-10-31 DIAGNOSIS — I119 Hypertensive heart disease without heart failure: Secondary | ICD-10-CM | POA: Diagnosis not present

## 2018-10-31 DIAGNOSIS — E7800 Pure hypercholesterolemia, unspecified: Secondary | ICD-10-CM | POA: Diagnosis not present

## 2018-10-31 DIAGNOSIS — I493 Ventricular premature depolarization: Secondary | ICD-10-CM | POA: Diagnosis not present

## 2018-10-31 HISTORY — DX: Ventricular premature depolarization: I49.3

## 2018-10-31 MED ORDER — ATENOLOL 25 MG PO TABS: ORAL_TABLET | ORAL | 3 refills | Status: DC

## 2018-10-31 NOTE — Patient Instructions (Signed)
Medication Instructions:  CONTINUE CURRENT MEDICATION EXCEPT --TAKE ATENOLOL 50 MG   ( 2 TABLETS OF 25 MG)IN THE MORNING  -- ATENOLOL  25 MG IN THE EVENING  --- MAY TAKE AN EXTRA 25 MG OF ATENOLOL IF NEEDED FOR  PALPATION OR INCREASE HEART RATE   If you need a refill on your cardiac medications before your next appointment, please call your pharmacy.   Lab work: NOT NEEDED If you have labs (blood work) drawn today and your tests are completely normal, you will receive your results only by: Marland Kitchen MyChart Message (if you have MyChart) OR . A paper copy in the mail If you have any lab test that is abnormal or we need to change your treatment, we will call you to review the results.  Testing/Procedures:   Follow-Up: At [facility], you and your health needs are our priority.  As part of our continuing mission to provide you with exceptional heart care, we have created designated Provider Care Teams.  These Care Teams include your primary Cardiologist (physician) and Advanced Practice Providers (APPs -  Physician Assistants and Nurse Practitioners) who all work together to provide you with the care you need, when you need it. You will need a follow up appointment in 7 months July 2020.  Please call our office 2 months in advance to schedule this appointment.  You may see Glenetta Hew, MD  or one of the following Advanced Practice Providers on your designated Care Team:   Rosaria Ferries, PA-C . Jory Sims, DNP, ANP  Any Other Special Instructions Will Be Listed Below (If Applicable).    Recommend lipids ( labs) be checked by your Primary  Doctor. The target  number of LDL should be less than 100.

## 2018-10-31 NOTE — Progress Notes (Signed)
PCP: Lisa Koch, MD  Clinic Note: Chief Complaint  Patient presents with  . Follow-up  . Palpitations    frequent PVCs.  . Cardiac Valve Problem    Aortic Insufficiency    HPI: Lisa Cox is a 73 y.o. female with a PMH below who presents today for 35-month follow-up for aortic insufficiency and PVCs.Lisa Cox was last seen on August 28, 2018 --was not feeling well having started diltiazem versus atenolol.  Noted off-and-on palpitations so I referred her to Dr. Curt Cox.  Just noted palpitations but no rapid irregular heartbeats.  No chest pain or heart failure.  No syncope or near syncope. I put her back on atenolol with PRN diltiazem.  Seen by Dr. Curt Cox 11/26 - RVOT PVCs. Recommended Med Rx - Flecainide - with ETT after - but EKG showed prolonged QT & Flecainide d/c'd.  Recent Hospitalizations: None  Studies Personally Reviewed - (if available, images/films reviewed: From Epic Chart or Care Everywhere)  Echo 09/11/2108: EF 55-60%.  No RWMA. Gr 2 DD. Mod AI.   Myoview 09/11/2018: LOW RISK. No ischemia or infarction  Interval History: Lisa Cox returns today overall noticing that her palpitations have become a little bit better since being back on the atenolol.  She did not tolerate flecainide because of the QT prolongation.  She is due to follow back up with Dr. Curt Cox.  She still has occasional PVCs, and notes palpitations but no rapid irregular heartbeats. One episode: Did have heart skipping with cold after Christmas (took a pseudophed).  Told to take additional Atenolol.   Otherwise, stable chronic standpoint: No chest pain or shortness of breath with rest or exertion.  No PND, orthopnea or edema.  No lightheadedness, dizziness, weakness or syncope/near syncope. No TIA/amaurosis fugax symptoms.  No claudication.  ROS: A comprehensive was performed. Review of Systems  Constitutional: Negative for malaise/fatigue.  HENT: Negative for congestion  and nosebleeds.   Respiratory: Negative for cough, shortness of breath and wheezing.   Gastrointestinal: Negative for blood in stool, heartburn and melena.  Genitourinary: Negative for hematuria.  Neurological: Positive for dizziness (Off-and-on, but no prolonged symptoms.). Negative for focal weakness and weakness.  Endo/Heme/Allergies: Does not bruise/bleed easily.   I have reviewed and (if needed) personally updated the patient's problem list, medications, allergies, past medical and surgical history, social and family history.   Past Medical History:  Diagnosis Date  . Anxiety   . Bell's palsy 2009  . Cancer ([place]) Feb.2011   breast,lumpectomy right side followed by radiation  . Depression   . GERD (gastroesophageal reflux disease)   . Hyperlipidemia   . Hypertension   . Palpitations    Frequent PVCs with some bigeminy noted on 48-hour monitor. No arrhythmias.  . Personal history of radiation therapy     Past Surgical History:  Procedure Laterality Date  . BREAST LUMPECTOMY  Feb. 2011   right breast,followed by radiation therapy  . CARDIAC EVENT MONITOR     Predominantly sinus rhythm with first-degree block. Minimum heart rate 63 bpm. Maximum heart rate 139 bpm. Average is 85 bpm. 17.5% PVCs.  V bigem & Trigem noted.    Marland Kitchen EYE SURGERY  1953   skin removal in eye  . GXT  04/2017   Exercise tolerance test off of beta-blockers: Normal blood pressure response to exercise.  No EKG changes to suggest ischemic changes.  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  Seen at rest, exercise and recovery.  Marland Kitchen NM  MYOVIEW LTD  08/2018   LOW RISK. No ischemia or infarction  . TRANSTHORACIC ECHOCARDIOGRAM  08/2018   EF 55-60%.  No RWMA. Gr 2 DD. Mod AI.  Marland Kitchen TUBAL LIGATION  1978    Current Meds  Medication Sig  . acetaminophen (TYLENOL) 500 MG tablet Take 1,000 mg by mouth every 6 (six) hours as needed. For cramping.  Marland Kitchen aspirin 81 MG tablet Take 81 mg by mouth daily as needed (blood  thinner). Does not take on a daily basis  . atenolol (TENORMIN) 25 MG tablet TAKE 50  MG ( 2 TABLETS OF 25 MG) IN THE MORNING, TAKE 25 MG IN THE EVENING , MAY TAKE AN EXTRA TABLET IF NEEDED OF PALPATIONS  . b complex vitamins tablet Take 1 tablet by mouth daily as needed (supplement).   . Calcium Citrate-Vitamin D (CALCIUM + D PO) Take 2 tablets by mouth daily.  . carboxymethylcellulose (REFRESH TEARS) 0.5 % SOLN Place 1 drop into both eyes 3 (three) times daily as needed (dry eye).   Marland Kitchen dimenhyDRINATE (DRAMAMINE) 50 MG tablet Take 50 mg by mouth every 8 (eight) hours as needed. Reported on 04/19/2016  . famotidine (PEPCID) 10 MG tablet Take 10 mg by mouth at bedtime.  Marland Kitchen LORazepam (ATIVAN) 1 MG tablet TAKE 1 TABLET BY MOUTH EVERYDAY AT BEDTIME  . Multiple Vitamins-Minerals (PRESERVISION AREDS 2) CAPS Take 2 capsules by mouth daily.  . Omega-3 Fatty Acids (FISH OIL CONCENTRATE PO) Take 1 capsule by mouth daily.   . phenazopyridine (PYRIDIUM) 100 MG tablet Take 1 tablet (100 mg total) by mouth 3 (three) times daily as needed for pain.  . ramipril (ALTACE) 10 MG capsule TAKE 2 CAPSULES BY MOUTH EVERY DAY  . [DISCONTINUED] atenolol (TENORMIN) 25 MG tablet Take 1 tablet (25 mg total) by mouth 2 (two) times daily.    Allergies  Allergen Reactions  . Amlodipine     Peripheral edema  . Crestor [Rosuvastatin Calcium]     Leg cramps  . Erythromycin     nausea  . Macrobid [Nitrofurantoin Monohyd Macro] Nausea And Vomiting  . Niacin And Related Itching  . Restoril     Reaction-"nervous"  . Zolpidem Tartrate     Reaction-"nervous"    Social History   Tobacco Use  . Smoking status: Never Smoker  . Smokeless tobacco: Never Used  Substance Use Topics  . Alcohol use: No  . Drug use: No   Social History   Social History Narrative  . Not on file    family history includes Breast cancer (age of onset: 36) in her paternal aunt; Heart attack in her father; Heart disease in her father;  Hypertension in her brother and sister; Pancreatic cancer in her father; Stroke in her maternal aunt.  Wt Readings from Last 3 Encounters:  10/31/18 157 lb (71.2 kg)  09/16/18 157 lb (71.2 kg)  09/11/18 156 lb (70.8 kg)    PHYSICAL EXAM BP (!) 142/80   Pulse 73   Ht 5' 5.5" (1.664 m)   Wt 157 lb (71.2 kg)   SpO2 98%   BMI 25.73 kg/m  Physical Exam  Constitutional: She is oriented to person, place, and time. She appears well-developed and well-nourished. No distress.  Well-groomed.  Healthy appearing.  HENT:  Head: Normocephalic and atraumatic.  Neck: Normal range of motion. Neck supple. No hepatojugular reflux and no JVD present. Carotid bruit is not present.  Cardiovascular: Normal rate, regular rhythm, normal heart sounds, intact distal pulses and normal  pulses.  Occasional extrasystoles are present. PMI is not displaced. Exam reveals no gallop and no friction rub.  No murmur heard. Pulmonary/Chest: Effort normal and breath sounds normal. No respiratory distress. She has no wheezes. She has no rales.  Musculoskeletal: Normal range of motion.        General: No edema.  Neurological: She is alert and oriented to person, place, and time.  Psychiatric: She has a normal mood and affect. Her behavior is normal. Judgment and thought content normal.  Vitals reviewed.   Adult ECG Report n/a  Other studies Reviewed: Additional studies/ records that were reviewed today include:  Recent Labs: None recently checked.  ASSESSMENT / PLAN: Problem List Items Addressed This Visit    Benign hypertensive heart disease without heart failure (Chronic)    Euvolemic on exam.  Able to tolerate current dose of beta-blocker.  Blood pressure is little high so she can tolerate the increase of atenolol to 50 mg morning and 20 5 in the evening.  Continue current dose of ramipril twice daily.  Low threshold to consider adding additional medication blood pressure is now stabilized.      Relevant  Medications   atenolol (TENORMIN) 25 MG tablet   Essential hypertension (Chronic)    A little bit high today.  Increase morning dose of atenolol to 50mg  and continue 25 mg; continue metoprolol.   May need additional medication, either diuretic or amlodipine -if not better controlled next visit..      Relevant Medications   atenolol (TENORMIN) 25 MG tablet   Frequent unifocal PVCs (Chronic)    Do to follow back up with Dr. Curt Cox.  Did not tolerate flecainide. We will increase morning dose of atenolol to 50 mg and continue p.m. dose of 25 with additional PRN doses.      Relevant Medications   atenolol (TENORMIN) 25 MG tablet   Hypercholesterolemia (Chronic)    Monitored by PCP.  Goal LDL should be less than 100.  Not currently on statin.      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 Venora Maples.  Continue beta-blocker.      Relevant Medications   atenolol (TENORMIN) 25 MG tablet      I spent a total of 15-20 minutes with the patient and chart review. >  50% of the time was spent in direct patient consultation.   Current medicines are reviewed at length with the patient today.  (+/- concerns) n/a The following changes have been made:  Increase atenolol a.m. dose to 50 mg.  Okay to take additional dose as needed.  Patient Instructions  Medication Instructions:  CONTINUE CURRENT MEDICATION EXCEPT --TAKE ATENOLOL 50 MG   ( 2 TABLETS OF 25 MG)IN THE MORNING  -- ATENOLOL  25 MG IN THE EVENING  --- MAY TAKE AN EXTRA 25 MG OF ATENOLOL IF NEEDED FOR  PALPATION OR INCREASE HEART RATE   If you need a refill on your cardiac medications before your next appointment, please call your pharmacy.   Lab work: NOT NEEDED If you have labs (blood work) drawn today and your tests are completely normal, you will receive your results only by: Marland Kitchen MyChart Message  (if you have MyChart) OR . A paper copy in the mail If you have any lab test that is abnormal or we need to change your treatment, we will call you  to review the results.  Testing/Procedures:   Follow-Up: At [facility], you and your health needs are our priority.  As part of our continuing mission to provide you with exceptional heart care, we have created designated Provider Care Teams.  These Care Teams include your primary Cardiologist (physician) and Advanced Practice Providers (APPs -  Physician Assistants and Nurse Practitioners) who all work together to provide you with the care you need, when you need it. You will need a follow up appointment in 7 months July 2020.  Please call our office 2 months in advance to schedule this appointment.  You may see Glenetta Hew, MD  or one of the following Advanced Practice Providers on your designated Care Team:   Rosaria Ferries, PA-C . Jory Sims, DNP, ANP  Any Other Special Instructions Will Be Listed Below (If Applicable).    Recommend lipids ( labs) be checked by your Primary  Doctor. The target  number of LDL should be less than 100.   Studies Ordered:   No orders of the defined types were placed in this encounter.     Glenetta Hew, M.D., M.S. Interventional Cardiologist   Pager # [phone] Phone # [phone] [address] [place], [place] [address]   Thank you for choosing Heartcare at [facility]!!

## 2018-11-02 ENCOUNTER — Encounter: Payer: Self-pay | Admitting: Cardiology

## 2018-11-02 NOTE — Assessment & Plan Note (Signed)
Euvolemic on exam.  Able to tolerate current dose of beta-blocker.  Blood pressure is little high so she can tolerate the increase of atenolol to 50 mg morning and 20 5 in the evening.  Continue current dose of ramipril twice daily.  Low threshold to consider adding additional medication blood pressure is now stabilized.

## 2018-11-02 NOTE — Assessment & Plan Note (Signed)
Follow-up with Dr. Venora Maples.  Continue beta-blocker.

## 2018-11-02 NOTE — Assessment & Plan Note (Signed)
Monitored by PCP.  Goal LDL should be less than 100.  Not currently on statin.

## 2018-11-02 NOTE — Assessment & Plan Note (Signed)
A little bit high today.  Increase morning dose of atenolol to 50mg  and continue 25 mg; continue metoprolol.   May need additional medication, either diuretic or amlodipine -if not better controlled next visit.Lisa Cox

## 2018-11-02 NOTE — Assessment & Plan Note (Signed)
Do to follow back up with Dr. Curt Bears.  Did not tolerate flecainide. We will increase morning dose of atenolol to 50 mg and continue p.m. dose of 25 with additional PRN doses.

## 2018-11-13 DIAGNOSIS — H25811 Combined forms of age-related cataract, right eye: Secondary | ICD-10-CM | POA: Diagnosis not present

## 2018-11-13 DIAGNOSIS — H2511 Age-related nuclear cataract, right eye: Secondary | ICD-10-CM | POA: Diagnosis not present

## 2018-12-08 ENCOUNTER — Telehealth: Payer: Self-pay | Admitting: Cardiology

## 2018-12-08 ENCOUNTER — Encounter: Payer: Self-pay | Admitting: Cardiology

## 2018-12-08 NOTE — Telephone Encounter (Signed)
Routing to NL triage to address w/ Dr. Ellyn Hack as he recently changed her medication doses and Dr. Curt Bears is not in the office this week.

## 2018-12-08 NOTE — Telephone Encounter (Signed)
This encounter was created in error - please disregard.

## 2018-12-08 NOTE — Telephone Encounter (Signed)
Sending as stat as pt states her HR is 36

## 2018-12-08 NOTE — Telephone Encounter (Signed)
Spoke with patient this morning regarding PVC's and low HR. Patient has been having PVC's that seem to be every few beats. She has been taking her Atenolol 50 mg in am, 25 mg in pm, and took an extra X1 dose. Patient stated she did well on Flecainide 100 mg twice a day but that was d/c secondary to EKG changes. Discussed with Dr Ellyn Hack and he recommends patient taking Flecainide 100 mg twice a day for 2 days to see if improves and follow up with Dr Curt Bears EP. Advised patient and message sent to schedulers to arrange. Will forward to [facility] RN with Dr Curt Bears.

## 2018-12-08 NOTE — Telephone Encounter (Signed)
New Message   Patient c/o Palpitations:  High priority if patient c/o lightheadedness, shortness of breath, or chest pain  1) How long have you had palpitations/irregular HR/ Afib? Are you having the symptoms now? Since Friday, yes   2) Are you currently experiencing lightheadedness, SOB or CP? No   3) Do you have a history of afib (atrial fibrillation) or irregular heart rhythm? yes  4) Have you checked your BP or HR? (document readings if available): BP 148/80 HR 36  5) Are you experiencing any other symptoms? Nausea

## 2018-12-08 NOTE — Telephone Encounter (Signed)
New Message   Patient states she needs to let Rip Harbour know her medication is scored so she can break the pill in have so she can take 50mg  of the medication instead of the 100mg .

## 2018-12-10 ENCOUNTER — Other Ambulatory Visit: Payer: Self-pay | Admitting: Internal Medicine

## 2018-12-10 NOTE — Telephone Encounter (Signed)
Called pharmacy last refill 10/20/2018 for 30 tabs LOV: acute 05/30/2018 HOO:ILNZ

## 2018-12-11 ENCOUNTER — Telehealth: Payer: Self-pay | Admitting: Cardiology

## 2018-12-11 NOTE — Telephone Encounter (Signed)
Follow Up:     Pt wanted you to know that the Flecainide 40 mg was working.

## 2018-12-11 NOTE — Telephone Encounter (Signed)
Patient wanted to make Lisa Cox aware.  Patient was given instructions again on how to take it, and then notify if symptoms came back. Patient verbalized understanding.

## 2018-12-12 NOTE — Telephone Encounter (Signed)
We should have her come in for an EKG early next week to reassess (send copy to DR. Camnitz)  Glenetta Hew, MD

## 2018-12-15 NOTE — Telephone Encounter (Signed)
°  Follow up    Patient calling with questions about continuing Flecainide

## 2018-12-15 NOTE — Telephone Encounter (Signed)
Per pt is only taking Flecainide 50 mg bid and is aware to come in tomorrow for EKG per Dr Ellyn Hack pt has appt at 10:00 am .Adonis Housekeeper

## 2018-12-16 ENCOUNTER — Telehealth: Payer: Self-pay

## 2018-12-16 ENCOUNTER — Ambulatory Visit (INDEPENDENT_AMBULATORY_CARE_PROVIDER_SITE_OTHER): Payer: PPO

## 2018-12-16 VITALS — BP 165/70 | HR 72 | Resp 11

## 2018-12-16 DIAGNOSIS — I493 Ventricular premature depolarization: Secondary | ICD-10-CM | POA: Diagnosis not present

## 2018-12-16 MED ORDER — FLECAINIDE ACETATE 50 MG PO TABS: 25.0000 mg | ORAL_TABLET | Freq: Two times a day (BID) | ORAL | 3 refills | Status: DC

## 2018-12-16 NOTE — Progress Notes (Signed)
Pt came in for an EKG today per Dr. Ellyn Hack since starting back on her Flecainide 50mg  po bid last Monday 12/08/2018... she reports that she is still having some palpitations but mostly at night when trying to go to sleep... she denies dizziness, sob, presyncope...  After reviewing EKG with Dr. Ellyn Hack with new LBBB, HR 72, QT 460/ QTc 503...  Pt advised to decrease her Flecainide to 25 mg po bid and may take an extra Atenolol 25mg  as needed for palpitations...   Pt to keep her appt with Dr. Curt Bears 12/23/2018 and to call sooner if she develops any worsening or new symptoms.   Pt verbalized understanding and agreed.

## 2018-12-16 NOTE — Patient Instructions (Signed)
   Your physician has recommended you make the following change in your medication: decrease Flecainide to 25mg  twice a day.  Keep your follow up with Dr. Curt Bears 12/23/2018.

## 2018-12-16 NOTE — Telephone Encounter (Signed)
Spoke with Dr. Delos Haring and after reviewing EKG from 12/16/18 nurse visit.Marland Kitchen pt advised to hold Flecainide until her 12/23/18 visit with Dr. Curt Bears and may continue to use her Atenolol daily and prn for palpitations but to call if symptoms are worsening and she is not feeling well. Pt verbalized understanding and agreed.

## 2018-12-23 ENCOUNTER — Encounter: Payer: Self-pay | Admitting: Cardiology

## 2018-12-23 ENCOUNTER — Ambulatory Visit: Payer: PPO | Admitting: Cardiology

## 2018-12-23 VITALS — BP 132/74 | HR 66 | Ht 65.5 in | Wt 158.0 lb

## 2018-12-23 DIAGNOSIS — R0683 Snoring: Secondary | ICD-10-CM

## 2018-12-23 DIAGNOSIS — I493 Ventricular premature depolarization: Secondary | ICD-10-CM

## 2018-12-23 DIAGNOSIS — R400 Somnolence: Secondary | ICD-10-CM

## 2018-12-23 MED ORDER — MEXILETINE HCL 150 MG PO CAPS: 150.0000 mg | ORAL_CAPSULE | Freq: Two times a day (BID) | ORAL | 3 refills | Status: DC

## 2018-12-23 NOTE — Progress Notes (Signed)
Electrophysiology Office Note   Date:  12/23/2018   ID:  Lisa Cox, DOB Nov 27, 1945, MRN [id number]  PCP:  Hoyt Koch, MD  Cardiologist:  Ellyn Hack Primary Electrophysiologist:  [name], MD    No chief complaint on file.    History of Present Illness: Lisa Cox is a 73 y.o. female who is being seen today for the evaluation of PVCs at the request of Glenetta Hew. Presenting today for electrophysiology evaluation.  Is a history of hypertension, hyperlipidemia, and palpitations.  She was found to have a high burden of PVCs.  She was put on diltiazem, but did not tolerate the medication.  She continues to have palpitations, but minimal dyspnea or dizziness.  She is back on her atenolol after wearing her monitor.  Her PVC burden has gone down since starting the atenolol.  She does get mildly short of breath when she exerts herself, and she feels that her PVCs may contribute to this.  Today, denies symptoms of chest pain, shortness of breath, orthopnea, PND, lower extremity edema, claudication, dizziness, presyncope, syncope, bleeding, or neurologic sequela. The patient is tolerating medications without difficulties.  She did not tolerate her flecainide.  She had QRS widening.  She does continue to feel palpitations on a daily basis.   Past Medical History:  Diagnosis Date  . Anxiety   . Bell's palsy 2009  . Cancer ([place]) Feb.2011   breast,lumpectomy right side followed by radiation  . Depression   . GERD (gastroesophageal reflux disease)   . Hyperlipidemia   . Hypertension   . Palpitations    Frequent PVCs with some bigeminy noted on 48-hour monitor. No arrhythmias.  . Personal history of radiation therapy    Past Surgical History:  Procedure Laterality Date  . BREAST LUMPECTOMY  Feb. 2011   right breast,followed by radiation therapy  . CARDIAC EVENT MONITOR     Predominantly sinus rhythm with first-degree block. Minimum heart rate 63 bpm. Maximum  heart rate 139 bpm. Average is 85 bpm. 17.5% PVCs.  V bigem & Trigem noted.    Marland Kitchen EYE SURGERY  1953   skin removal in eye  . GXT  04/2017   Exercise tolerance test off of beta-blockers: Normal blood pressure response to exercise.  No EKG changes to suggest ischemic changes.  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  Seen at rest, exercise and recovery.  Marland Kitchen NM MYOVIEW LTD  08/2018   LOW RISK. No ischemia or infarction  . TRANSTHORACIC ECHOCARDIOGRAM  08/2018   EF 55-60%.  No RWMA. Gr 2 DD. Mod AI.  Marland Kitchen TUBAL LIGATION  1978     Current Outpatient Medications  Medication Sig Dispense Refill  . acetaminophen (TYLENOL) 500 MG tablet Take 1,000 mg by mouth every 6 (six) hours as needed. For cramping.    Marland Kitchen aspirin 81 MG tablet Take 81 mg by mouth daily as needed (blood thinner). Does not take on a daily basis    . atenolol (TENORMIN) 25 MG tablet TAKE 50  MG ( 2 TABLETS OF 25 MG) IN THE MORNING, TAKE 25 MG IN THE EVENING , MAY TAKE AN EXTRA TABLET IF NEEDED OF PALPATIONS 315 tablet 3  . b complex vitamins tablet Take 1 tablet by mouth daily as needed (supplement).     . Calcium Citrate-Vitamin D (CALCIUM + D PO) Take 2 tablets by mouth daily.    . carboxymethylcellulose (REFRESH TEARS) 0.5 % SOLN Place 1 drop into both eyes 3 (  three) times daily as needed (dry eye).     Marland Kitchen dimenhyDRINATE (DRAMAMINE) 50 MG tablet Take 50 mg by mouth every 8 (eight) hours as needed. Reported on 04/19/2016    . famotidine (PEPCID) 10 MG tablet Take 10 mg by mouth at bedtime.    Marland Kitchen LORazepam (ATIVAN) 1 MG tablet TAKE 1 TABLET BY MOUTH EVERYDAY AT BEDTIME 30 tablet 2  . Multiple Vitamins-Minerals (PRESERVISION AREDS 2) CAPS Take 2 capsules by mouth daily.    . Omega-3 Fatty Acids (FISH OIL CONCENTRATE PO) Take 1 capsule by mouth daily.     . phenazopyridine (PYRIDIUM) 100 MG tablet Take 1 tablet (100 mg total) by mouth 3 (three) times daily as needed for pain. 10 tablet 0  . ramipril (ALTACE) 10 MG capsule TAKE 2  CAPSULES BY MOUTH EVERY DAY 60 capsule 7   No current facility-administered medications for this visit.     Allergies:   Amlodipine; Crestor [rosuvastatin calcium]; Erythromycin; Macrobid [nitrofurantoin monohyd macro]; Niacin and related; Restoril; and Zolpidem tartrate   Social History:  The patient  reports that she has never smoked. She has never used smokeless tobacco. She reports that she does not drink alcohol or use drugs.   Family History:  The patient's family history includes Breast cancer (age of onset: 47) in her paternal aunt; Heart attack in her father; Heart disease in her father; Hypertension in her brother and sister; Pancreatic cancer in her father; Stroke in her maternal aunt.    ROS:  Please see the history of present illness.   Otherwise, review of systems is positive for palpitations.   All other systems are reviewed and negative.   PHYSICAL EXAM: VS:  BP 132/74   Pulse 66   Ht 5' 5.5" (1.664 m)   Wt 158 lb (71.7 kg)   BMI 25.89 kg/m  , BMI Body mass index is 25.89 kg/m. GEN: Well nourished, well developed, in no acute distress  HEENT: normal  Neck: no JVD, carotid bruits, or masses Cardiac: iRRR; no murmurs, rubs, or gallops,no edema  Respiratory:  clear to auscultation bilaterally, normal work of breathing GI: soft, nontender, nondistended, + BS MS: no deformity or atrophy  Skin: warm and dry Neuro:  Strength and sensation are intact Psych: euthymic mood, full affect  EKG:  EKG is ordered today. Personal review of the ekg ordered shows sinus rhythm, rate 66  Recent Labs: 05/30/2018: ALT 13; BUN 11; Creatinine, Ser 0.99; Hemoglobin 13.0; Platelets 186.0; Potassium 4.2; Sodium 137; TSH 4.17    Lipid Panel     Component Value Date/Time   CHOL 186 07/12/2017 0857   TRIG 127.0 07/12/2017 0857   HDL 39.10 07/12/2017 0857   CHOLHDL 5 07/12/2017 0857   VLDL 25.4 07/12/2017 0857   LDLCALC 122 (H) 07/12/2017 0857     Wt Readings from Last 3 Encounters:   12/23/18 158 lb (71.7 kg)  10/31/18 157 lb (71.2 kg)  09/16/18 157 lb (71.2 kg)      Other studies Reviewed: Additional studies/ records that were reviewed today include: TTE 09/02/18  Review of the above records today demonstrates:   - Left ventricle: The cavity size was normal. Systolic function was   normal. The estimated ejection fraction was in the range of 55%   to 60%. Wall motion was normal; there were no regional wall   motion abnormalities. Features are consistent with a pseudonormal   left ventricular filling pattern, with concomitant abnormal   relaxation and increased filling pressure (  grade 2 diastolic   dysfunction). - Aortic valve: There was moderate regurgitation. Regurgitation   pressure half-time: 422 ms. - Mitral valve: There was trivial regurgitation. - Right ventricle: Systolic function was normal. - Atrial septum: No defect or patent foramen ovale was identified. - Tricuspid valve: There was trivial regurgitation. - Pulmonic valve: There was no significant regurgitation.  SPECT 09/11/18  There was no ST segment deviation noted during stress.  The study is normal.  This is a low risk study with no evidence of perfusion abnormalities.  This study was not gated due to frequent ventricular ectopy.  Monitor 08/26/18 - personally reviewed  Predominantly sinus rhythm with first-degree block. Minimum heart rate 63 bpm. Maximum heart rate 139 bpm. Average is 85 bpm.  Very rare PACs noted. (<1%)  Frequent isolated PVCs (17.5%);  Ventricular trigeminy and bigeminy noted on diary.  No ventricular tachycardia or other arrhythmias. No pauses or bradycardia.   Significant amount of ventricular ectopy noted, but no arrhythmia.  ASSESSMENT AND PLAN:  1.  PVCs: Appear to be outflow tract in nature, likely RVOT.  Previously this was controlled with atenolol, but her most recent monitor showed up to 17% PVCs.  Due to that, we will plan to start her on  mexiletine.  She did not tolerate flecainide as she had QRS widening on the low doses.  We will get an EKG 1 week after starting mexiletine.   2.  Hypertension: Well-controlled.  No changes.   Current medicines are reviewed at length with the patient today.   The patient does not have concerns regarding her medicines.  The following changes were made today: None  Labs/ tests ordered today include:  Orders Placed This Encounter  Procedures  . EKG 12-Lead    Disposition:   FU with Will Camnitz 3 months  Signed, [name], MD  12/23/2018 11:28 AM     [facility] [address] [place] [address] [phone] (office) [phone] (fax)

## 2018-12-23 NOTE — Addendum Note (Signed)
Addended by: Stanton Kidney on: 12/23/2018 11:33 AM   Modules accepted: Orders

## 2018-12-23 NOTE — Patient Instructions (Signed)
Medication Instructions:  Your physician has recommended you make the following change in your medication:  1. START Mexiletine 150 mg twice a day  * If you need a refill on your cardiac medications before your next appointment, please call your pharmacy.   Labwork: None ordered  Testing/Procedures: None ordered  Follow-Up: Your physician recommends that you schedule a follow-up appointment in: 1 week for nurse visit EKG.  Your physician recommends that you schedule a follow-up appointment in: 3 months with Dr. Curt Bears.  Thank you for choosing [facility]!!   Trinidad Curet, RN [phone]  Any Other Special Instructions Will Be Listed Below (If Applicable).  Mexiletine capsules What is this medicine? MEXILETINE (mex [place] e teen) is an antiarrhythmic agent. This medicine is used to treat irregular heart rhythm and can slow rapid heartbeats. It can help your heart to return to and maintain a normal rhythm. Because of the side effects caused by this medicine, it is usually used for heartbeat problems that may be life-threatening. This medicine may be used for other purposes; ask your health care provider or pharmacist if you have questions. COMMON BRAND NAME(S): Mexitil What should I tell my health care provider before I take this medicine? They need to know if you have any of these conditions: -liver disease -other heart problems -previous heart attack -an unusual or allergic reaction to mexiletine, other medicines, foods, dyes, or preservatives -pregnant or trying to get pregnant -breast-feeding How should I use this medicine? Take this medicine by mouth with a glass of water. Follow the directions on the prescription label. It is recommended that you take this medicine with food or an antacid. Take your doses at regular intervals. Do not take your medicine more often than directed. Do not stop taking except on the advice of your doctor or health care professional. Talk to  your pediatrician regarding the use of this medicine in children. Special care may be needed. Overdosage: If you think you have taken too much of this medicine contact a poison control center or emergency room at once. NOTE: This medicine is only for you. Do not share this medicine with others. What if I miss a dose? If you miss a dose, take it as soon as you can. If it is almost time for your next dose, take only that dose. Do not take double or extra doses. What may interact with this medicine? Do not take this medicine with any of the following medications: -dofetilide This medicine may also interact with the following medications: -caffeine -cimetidine -medicines for depression, anxiety, or psychotic disturbances -medicines to control heart rhythm -phenobarbital -phenytoin -rifampin -theophylline This list may not describe all possible interactions. Give your health care provider a list of all the medicines, herbs, non-prescription drugs, or dietary supplements you use. Also tell them if you smoke, drink alcohol, or use illegal drugs. Some items may interact with your medicine. What should I watch for while using this medicine? Your condition will be monitored closely when you first begin therapy. Often, this drug is first started in a hospital or other monitored health care setting. Once you are on maintenance therapy, visit your doctor or health care professional for regular checks on your progress. Because your condition and use of this medicine carry some risk, it is a good idea to carry an identification card, necklace or bracelet with details of your condition, medications, and doctor or health care professional. Dennis Bast may get drowsy or dizzy. Do not drive, use machinery, or  do anything that needs mental alertness until you know how this medicine affects you. Do not stand or sit up quickly, especially if you are an older patient. This reduces the risk of dizzy or fainting spells. Alcohol  can make you more dizzy, increase flushing and rapid heartbeats. Avoid alcoholic drinks. What side effects may I notice from receiving this medicine? Side effects that you should report to your doctor or health care professional as soon as possible: -allergic reactions like skin rash, itching or hives, swelling of the face, lips, or tongue -breathing problems -chest pain, continued irregular heartbeats -redness, blistering, peeling or loosening of the skin, including inside the mouth -seizures -skin rash -trembling, shaking -unusual bleeding or bruising -unusually weak or tired Side effects that usually do not require medical attention (report to your doctor or health care professional if they continue or are bothersome): -blurred vision -difficulty walking -heartburn -nausea, vomiting -nervousness -numbness, or tingling in the fingers or toes This list may not describe all possible side effects. Call your doctor for medical advice about side effects. You may report side effects to FDA at 1-800-FDA-1088. Where should I keep my medicine? Keep out of reach of children. Store at room temperature between 15 and 30 degrees C (59 and 86 degrees F). Throw away any unused medicine after the expiration date. NOTE: This sheet is a summary. It may not cover all possible information. If you have questions about this medicine, talk to your doctor, pharmacist, or health care provider.  2019 Elsevier/Gold Standard (2008-04-26 13:59:49)

## 2018-12-24 ENCOUNTER — Telehealth: Payer: Self-pay | Admitting: *Deleted

## 2018-12-24 NOTE — Addendum Note (Signed)
Addended by: Stanton Kidney on: 12/24/2018 10:31 AM   Modules accepted: Orders

## 2018-12-24 NOTE — Telephone Encounter (Signed)
-----   Message from Lauralee Evener, [place] sent at 12/24/2018 11:16 AM EST ----- Regarding: RE: Home sleep study Patient's insurance does not require a PA for HST. Ok to schedule. ----- Message ----- From: Stanton Kidney, RN Sent: 12/24/2018  10:28 AM EST To: Stanton Kidney, RN, [facility] Sleep Studies Subject: Home sleep study                               Please precert and arrange home sleep study  (pt is agreeable only to a home study) Snoring, daytime fatigue  Thx :) Trinidad Curet, RN

## 2018-12-24 NOTE — Telephone Encounter (Signed)
Patient is aware and agreeable to Home Sleep Study through [facility]. Patient is scheduled for   at    to pick up home sleep kit and meet with Respiratory therapist at [facility]. Patient is aware that if this appointment date and time does not work for them they should contact Artis Delay directly at [phone]. Patient is aware that a sleep packet will be sent from [facility] in week. Patient is agreeable to treatment and thankful for call.

## 2018-12-25 ENCOUNTER — Telehealth: Payer: Self-pay | Admitting: Cardiology

## 2018-12-25 NOTE — Telephone Encounter (Signed)
Spoke to the patient and she would like to speak with Sherri, because she was told to call if side effects occur with the start of Mexiletine on 3/3.  Please call patient to further advise, thank you.

## 2018-12-25 NOTE — Telephone Encounter (Signed)
Pt c/o medication issue:  1. Name of Medication: mexiletine (MEXITIL) 150 MG capsule  2. How are you currently taking this medication (dosage and times per day)? 1 capsule 2x daily  3. Are you having a reaction (difficulty breathing--STAT)? Fatigue, constipation, dry mouth,anxiety, difficulty sleeping   4. What is your medication issue? Wants to know if side effects are being caused by the medication or from another source   Pt asked to speak with Trinidad Curet

## 2018-12-26 NOTE — Telephone Encounter (Signed)
Reviewed w/ Dr. Curt Bears. Advised pt to continue taking medication for several more days to see if SE subside/improve.    Pt agreeable to calling Monday if still having issues and we will discuss another option.

## 2019-01-02 ENCOUNTER — Other Ambulatory Visit (INDEPENDENT_AMBULATORY_CARE_PROVIDER_SITE_OTHER): Payer: PPO

## 2019-01-02 ENCOUNTER — Ambulatory Visit (INDEPENDENT_AMBULATORY_CARE_PROVIDER_SITE_OTHER): Payer: PPO

## 2019-01-02 ENCOUNTER — Other Ambulatory Visit: Payer: Self-pay

## 2019-01-02 VITALS — BP 160/90 | HR 69 | Wt 153.0 lb

## 2019-01-02 DIAGNOSIS — I493 Ventricular premature depolarization: Secondary | ICD-10-CM | POA: Diagnosis not present

## 2019-01-02 NOTE — Progress Notes (Addendum)
Saw pt today for a nurse visit for 2 week EKG after starting Mexiletine 150mg  po bid... Pt reports that her palpitations have much improved.. she is still complaining of nausea with it but not worsening.  Pt BP ellevated 160/90 but pt attributes to being anxious and that she monitors it at home and will keep record of it and review with Dr. Ellyn Hack.  Reviewed EKG with Dr. Angelena Form (DOD)... NSR HR69 occasional PVC, QT 392/ QTc 420.  Dr. Angelena Form recommends that she continue the same dose and to forward to Dr. Curt Bears for his review.   Pt asking to have another EKG in 2 weeks for her own piece of mind... will run by Dr. Curt Bears if he would like to order one and will call her to make an appt for another nurse visit... next OV 03/30/2019.   Addendum: 01/05/19 After talking with Dr. Curt Bears.. pt advised will hold off on EKG for now since not recommended after the one she just had and to let us know if her symptoms worsen.. pt says she is still feeling well and verbalized understanding.

## 2019-01-02 NOTE — Progress Notes (Signed)
EKG ordered for Nurse visit done 01/02/19.

## 2019-01-02 NOTE — Patient Instructions (Signed)
Medication Instructions:  Your physician recommends that you continue on your current medications as directed. Please refer to the Current Medication list given to you today.  If you need a refill on your cardiac medications before your next appointment, please call your pharmacy.   Lab work: None ordered If you have labs (blood work) drawn today and your tests are completely normal, you will receive your results only by: Marland Kitchen MyChart Message (if you have MyChart) OR . A paper copy in the mail If you have any lab test that is abnormal or we need to change your treatment, we will call you to review the results.  Testing/Procedures:EKG today  Follow-Up: At [facility] you and your health needs are our priority.  As part of our continuing mission to provide you with exceptional heart care, we have created designated Provider Care Teams.  These Care Teams include your primary Cardiologist (physician) and Advanced Practice Providers (APPs -  Physician Assistants and Nurse Practitioners) who all work together to provide you with the care you need, when you need it.  Any Other Special Instructions Will Be Listed Below (If Applicable).

## 2019-01-06 ENCOUNTER — Encounter (HOSPITAL_BASED_OUTPATIENT_CLINIC_OR_DEPARTMENT_OTHER): Payer: PPO

## 2019-01-15 ENCOUNTER — Other Ambulatory Visit: Payer: Self-pay | Admitting: Cardiology

## 2019-01-15 MED ORDER — ATENOLOL 25 MG PO TABS: ORAL_TABLET | ORAL | 3 refills | Status: DC

## 2019-01-15 NOTE — Telephone Encounter (Signed)
°*  STAT* If patient is at the pharmacy, call can be transferred to refill team.   1. Which medications need to be refilled? (please list name of each medication and dose if known)  atenolol (TENORMIN) 25 MG tablet  2. Which pharmacy/location (including street and city if local pharmacy) is medication to be sent to? [id number]  3. Do they need a 30 day or 90 day supply? 109   Dr. Ellyn Hack changed the dosage from 2x a day to 3x a day, and now the Pt will run out before Insurance will cover a refill. The pt would like the doctor to write a new rx so she does not run out of medication

## 2019-02-02 ENCOUNTER — Encounter (HOSPITAL_BASED_OUTPATIENT_CLINIC_OR_DEPARTMENT_OTHER): Payer: PPO

## 2019-03-05 ENCOUNTER — Other Ambulatory Visit: Payer: Self-pay | Admitting: Obstetrics and Gynecology

## 2019-03-05 DIAGNOSIS — Z1231 Encounter for screening mammogram for malignant neoplasm of breast: Secondary | ICD-10-CM

## 2019-03-09 ENCOUNTER — Other Ambulatory Visit: Payer: Self-pay | Admitting: Internal Medicine

## 2019-03-09 NOTE — Telephone Encounter (Signed)
Control database checked last refill:02/07/2019 LOV: 05/30/2018 WHQ:PRFF

## 2019-03-15 ENCOUNTER — Other Ambulatory Visit: Payer: Self-pay | Admitting: Cardiology

## 2019-03-24 ENCOUNTER — Other Ambulatory Visit: Payer: Self-pay | Admitting: Cardiology

## 2019-03-24 MED ORDER — RAMIPRIL 10 MG PO CAPS: 20.0000 mg | ORAL_CAPSULE | Freq: Every day | ORAL | 2 refills | Status: DC

## 2019-03-24 NOTE — Telephone Encounter (Signed)
New message    *STAT* If patient is at the pharmacy, call can be transferred to refill team.   1. Which medications need to be refilled? (please list name of each medication and dose if known) ramipril (ALTACE) 10 MG capsule  2. Which pharmacy/location (including street and city if local pharmacy) is medication to be sent to?[facility] [place] [place], [place]  3. Do they need a 30 day or 90 day supply?[place]

## 2019-03-24 NOTE — Telephone Encounter (Signed)

## 2019-03-24 NOTE — Telephone Encounter (Signed)
This is Dr. Harding's pt. °

## 2019-03-24 NOTE — Addendum Note (Signed)
Addended byBarry Brunner on: 03/24/2019 01:15 PM   Modules accepted: Orders

## 2019-03-31 ENCOUNTER — Other Ambulatory Visit: Payer: Self-pay

## 2019-03-31 ENCOUNTER — Encounter: Payer: Self-pay | Admitting: Cardiology

## 2019-03-31 ENCOUNTER — Ambulatory Visit: Payer: PPO | Admitting: Cardiology

## 2019-03-31 VITALS — BP 124/60 | HR 75 | Ht 65.5 in | Wt 151.0 lb

## 2019-03-31 DIAGNOSIS — I493 Ventricular premature depolarization: Secondary | ICD-10-CM | POA: Diagnosis not present

## 2019-03-31 NOTE — Patient Instructions (Signed)
Medication Instructions:  Your physician has recommended you make the following change in your medication:  1. STOP Mexiletine  * If you need a refill on your cardiac medications before your next appointment, please call your pharmacy.   Labwork: None ordered  Testing/Procedures: None ordered  Follow-Up: Your physician recommends that you schedule a follow-up appointment in: 3 months with Dr. Curt Bears.  Thank you for choosing [facility]!!   Trinidad Curet, RN [phone]  Any Other Special Instructions Will Be Listed Below (If Applicable).  Cardiac Ablation Cardiac ablation is a procedure to disable (ablate) a small amount of heart tissue in very specific places. The heart has many electrical connections. Sometimes these connections are abnormal and can cause the heart to beat very fast or irregularly. Ablating some of the problem areas can improve the heart rhythm or return it to normal. Ablation may be done for people who:  Have Wolff-Parkinson-White syndrome.  Have fast heart rhythms (tachycardia).  Have taken medicines for an abnormal heart rhythm (arrhythmia) that were not effective or caused side effects.  Have a high-risk heartbeat that may be life-threatening. During the procedure, a small incision is made in the neck or the groin, and a long, thin, flexible tube (catheter) is inserted into the incision and moved to the heart. Small devices (electrodes) on the tip of the catheter will send out electrical currents. A type of X-ray (fluoroscopy) will be used to help guide the catheter and to provide images of the heart. Tell a health care provider about:  Any allergies you have.  All medicines you are taking, including vitamins, herbs, eye drops, creams, and over-the-counter medicines.  Any problems you or family members have had with anesthetic medicines.  Any blood disorders you have.  Any surgeries you have had.  Any medical conditions you have, such as  kidney failure.  Whether you are pregnant or may be pregnant. What are the risks? Generally, this is a safe procedure. However, problems may occur, including:  Infection.  Bruising and bleeding at the catheter insertion site.  Bleeding into the chest, especially into the sac that surrounds the heart. This is a serious complication.  Stroke or blood clots.  Damage to other structures or organs.  Allergic reaction to medicines or dyes.  Need for a permanent pacemaker if the normal electrical system is damaged. A pacemaker is a small computer that sends electrical signals to the heart and helps your heart beat normally.  The procedure not being fully effective. This may not be recognized until months later. Repeat ablation procedures are sometimes required. What happens before the procedure?  Follow instructions from your health care provider about eating or drinking restrictions.  Ask your health care provider about: ? Changing or stopping your regular medicines. This is especially important if you are taking diabetes medicines or blood thinners. ? Taking medicines such as aspirin and ibuprofen. These medicines can thin your blood. Do not take these medicines before your procedure if your health care provider instructs you not to.  Plan to have someone take you home from the hospital or clinic.  If you will be going home right after the procedure, plan to have someone with you for 24 hours. What happens during the procedure?  To lower your risk of infection: ? Your health care team will wash or sanitize their hands. ? Your skin will be washed with soap. ? Hair may be removed from the incision area.  An IV tube will be inserted into  one of your veins.  You will be given a medicine to help you relax (sedative).  The skin on your neck or groin will be numbed.  An incision will be made in your neck or your groin.  A needle will be inserted through the incision and into a  large vein in your neck or groin.  A catheter will be inserted into the needle and moved to your heart.  Dye may be injected through the catheter to help your surgeon see the area of the heart that needs treatment.  Electrical currents will be sent from the catheter to ablate heart tissue in desired areas. There are three types of energy that may be used to ablate heart tissue: ? Heat (radiofrequency energy). ? Laser energy. ? Extreme cold (cryoablation).  When the necessary tissue has been ablated, the catheter will be removed.  Pressure will be held on the catheter insertion area to prevent excessive bleeding.  A bandage (dressing) will be placed over the catheter insertion area. The procedure may vary among health care providers and hospitals. What happens after the procedure?  Your blood pressure, heart rate, breathing rate, and blood oxygen level will be monitored until the medicines you were given have worn off.  Your catheter insertion area will be monitored for bleeding. You will need to lie still for a few hours to ensure that you do not bleed from the catheter insertion area.  Do not drive for 24 hours or as long as directed by your health care provider. Summary  Cardiac ablation is a procedure to disable (ablate) a small amount of heart tissue in very specific places. Ablating some of the problem areas can improve the heart rhythm or return it to normal.  During the procedure, electrical currents will be sent from the catheter to ablate heart tissue in desired areas. This information is not intended to replace advice given to you by your health care provider. Make sure you discuss any questions you have with your health care provider. Document Released: 02/24/2009 Document Revised: 08/27/2016 Document Reviewed: 08/27/2016 Elsevier Interactive Patient Education  2019 [employer].

## 2019-03-31 NOTE — Progress Notes (Signed)
Electrophysiology Office Note   Date:  03/31/2019   ID:  Lisa Cox, DOB 1946/05/10, MRN [id number]  PCP:  Hoyt Koch, MD  Cardiologist:  Ellyn Hack Primary Electrophysiologist:  Francisco Eyerly Meredith Leeds, MD    No chief complaint on file.    History of Present Illness: Lisa Cox is a 73 y.o. female who is being seen today for the evaluation of PVCs at the request of Lisa Cox. Presenting today for electrophysiology evaluation.  Is a history of hypertension, hyperlipidemia, and palpitations.  She was found to have a high burden of PVCs.  She was put on diltiazem, but did not tolerate the medication.  She continues to have palpitations, but minimal dyspnea or dizziness.  She is back on her atenolol after wearing her monitor.  Her PVC burden has gone down since starting the atenolol.  She does get mildly short of breath when she exerts herself, and she feels that her PVCs may contribute to this.  She was switched to mexiletine at her last visit as her QRS widened on the flecainide.  Today, denies symptoms of chest pain, shortness of breath, orthopnea, PND, lower extremity edema, claudication, dizziness, presyncope, syncope, bleeding, or neurologic sequela. The patient is tolerating medications without difficulties.  Since starting the mexiletine, she is continued to have palpitations.  She also has been feeling jittery with shaking in her hands.   Past Medical History:  Diagnosis Date  . Anxiety   . Bell's palsy 2009  . Cancer ([place]) Feb.2011   breast,lumpectomy right side followed by radiation  . Depression   . GERD (gastroesophageal reflux disease)   . Hyperlipidemia   . Hypertension   . Palpitations    Frequent PVCs with some bigeminy noted on 48-hour monitor. No arrhythmias.  . Personal history of radiation therapy    Past Surgical History:  Procedure Laterality Date  . BREAST LUMPECTOMY  Feb. 2011   right breast,followed by radiation therapy  . CARDIAC EVENT  MONITOR     Predominantly sinus rhythm with first-degree block. Minimum heart rate 63 bpm. Maximum heart rate 139 bpm. Average is 85 bpm. 17.5% PVCs.  V bigem & Trigem noted.    Marland Kitchen EYE SURGERY  1953   skin removal in eye  . GXT  04/2017   Exercise tolerance test off of beta-blockers: Normal blood pressure response to exercise.  No EKG changes to suggest ischemic changes.  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  Seen at rest, exercise and recovery.  Marland Kitchen NM MYOVIEW LTD  08/2018   LOW RISK. No ischemia or infarction  . TRANSTHORACIC ECHOCARDIOGRAM  08/2018   EF 55-60%.  No RWMA. Gr 2 DD. Mod AI.  Marland Kitchen TUBAL LIGATION  1978     Current Outpatient Medications  Medication Sig Dispense Refill  . acetaminophen (TYLENOL) 500 MG tablet Take 1,000 mg by mouth every 6 (six) hours as needed. For cramping.    Marland Kitchen aspirin 81 MG tablet Take 81 mg by mouth daily as needed (blood thinner). Does not take on a daily basis    . atenolol (TENORMIN) 25 MG tablet TAKE 50  MG ( 2 TABLETS OF 25 MG) IN THE MORNING, TAKE 25 MG IN THE EVENING , MAY TAKE AN EXTRA TABLET IF NEEDED OF PALPATIONS 315 tablet 3  . b complex vitamins tablet Take 1 tablet by mouth daily as needed (supplement).     . Calcium Citrate-Vitamin D (CALCIUM + D PO) Take 2 tablets by mouth daily.    Marland Kitchen  carboxymethylcellulose (REFRESH TEARS) 0.5 % SOLN Place 1 drop into both eyes 3 (three) times daily as needed (dry eye).     Marland Kitchen dimenhyDRINATE (DRAMAMINE) 50 MG tablet Take 50 mg by mouth every 8 (eight) hours as needed. Reported on 04/19/2016    . famotidine (PEPCID) 10 MG tablet Take 10 mg by mouth at bedtime.    Marland Kitchen LORazepam (ATIVAN) 1 MG tablet TAKE 1 TABLET BY MOUTH EVERYDAY AT BEDTIME 30 tablet 2  . mexiletine (MEXITIL) 150 MG capsule TAKE 1 CAPSULE BY MOUTH TWICE A DAY 180 capsule 3  . Multiple Vitamins-Minerals (PRESERVISION AREDS 2) CAPS Take 2 capsules by mouth daily.    . Omega-3 Fatty Acids (FISH OIL CONCENTRATE PO) Take 1 capsule by mouth  daily.     . phenazopyridine (PYRIDIUM) 100 MG tablet Take 1 tablet (100 mg total) by mouth 3 (three) times daily as needed for pain. 10 tablet 0  . ramipril (ALTACE) 10 MG capsule Take 2 capsules (20 mg total) by mouth daily. 180 capsule 2   No current facility-administered medications for this visit.     Allergies:   Amlodipine; Crestor [rosuvastatin calcium]; Erythromycin; Macrobid [nitrofurantoin monohyd macro]; Niacin and related; Restoril; and Zolpidem tartrate   Social History:  The patient  reports that she has never smoked. She has never used smokeless tobacco. She reports that she does not drink alcohol or use drugs.   Family History:  The patient's family history includes Breast cancer (age of onset: 48) in her paternal aunt; Heart attack in her father; Heart disease in her father; Hypertension in her brother and sister; Pancreatic cancer in her father; Stroke in her maternal aunt.   ROS:  Please see the history of present illness.   Otherwise, review of systems is positive for none.   All other systems are reviewed and negative.   PHYSICAL EXAM: VS:  BP 124/60   Pulse 75   Ht 5' 5.5" (1.664 m)   Wt 151 lb (68.5 kg)   BMI 24.75 kg/m  , BMI Body mass index is 24.75 kg/m. GEN: Well nourished, well developed, in no acute distress  HEENT: normal  Neck: no JVD, carotid bruits, or masses Cardiac: RRR; no murmurs, rubs, or gallops,no edema  Respiratory:  clear to auscultation bilaterally, normal work of breathing GI: soft, nontender, nondistended, + BS MS: no deformity or atrophy  Skin: warm and dry Neuro:  Strength and sensation are intact Psych: euthymic mood, full affect  EKG:  EKG is ordered today. Personal review of the ekg ordered shows sinus rhythm, PVCs  Recent Labs: 05/30/2018: ALT 13; BUN 11; Creatinine, Ser 0.99; Hemoglobin 13.0; Platelets 186.0; Potassium 4.2; Sodium 137; TSH 4.17    Lipid Panel     Component Value Date/Time   CHOL 186 07/12/2017 0857   TRIG  127.0 07/12/2017 0857   HDL 39.10 07/12/2017 0857   CHOLHDL 5 07/12/2017 0857   VLDL 25.4 07/12/2017 0857   LDLCALC 122 (H) 07/12/2017 0857     Wt Readings from Last 3 Encounters:  03/31/19 151 lb (68.5 kg)  01/02/19 153 lb (69.4 kg)  12/23/18 158 lb (71.7 kg)      Other studies Reviewed: Additional studies/ records that were reviewed today include: TTE 09/02/18  Review of the above records today demonstrates:   - Left ventricle: The cavity size was normal. Systolic function was   normal. The estimated ejection fraction was in the range of 55%   to 60%. Wall motion was normal;  there were no regional wall   motion abnormalities. Features are consistent with a pseudonormal   left ventricular filling pattern, with concomitant abnormal   relaxation and increased filling pressure (grade 2 diastolic   dysfunction). - Aortic valve: There was moderate regurgitation. Regurgitation   pressure half-time: 422 ms. - Mitral valve: There was trivial regurgitation. - Right ventricle: Systolic function was normal. - Atrial septum: No defect or patent foramen ovale was identified. - Tricuspid valve: There was trivial regurgitation. - Pulmonic valve: There was no significant regurgitation.  SPECT 09/11/18  There was no ST segment deviation noted during stress.  The study is normal.  This is a low risk study with no evidence of perfusion abnormalities.  This study was not gated due to frequent ventricular ectopy.  Monitor 08/26/18 - personally reviewed  Predominantly sinus rhythm with first-degree block. Minimum heart rate 63 bpm. Maximum heart rate 139 bpm. Average is 85 bpm.  Very rare PACs noted. (<1%)  Frequent isolated PVCs (17.5%);  Ventricular trigeminy and bigeminy noted on diary.  No ventricular tachycardia or other arrhythmias. No pauses or bradycardia.   Significant amount of ventricular ectopy noted, but no arrhythmia.  ASSESSMENT AND PLAN:  1.  PVCs: PVCs appear  to be RVOT in nature.  She was tried on both flecainide and mexiletine, but she had side effects of both.  I Willma Obando take her off of these medications.  I Zayd Bonet give her information on ablation.  She would like to think about this.  Fortunately her ejection fraction has remained normal.  I Hailley Byers see her back in 3 months.   2.  Hypertension: Well-controlled.  No changes.   Current medicines are reviewed at length with the patient today.   The patient does not have concerns regarding her medicines.  The following changes were made today: None  Labs/ tests ordered today include:  Orders Placed This Encounter  Procedures  . EKG 12-Lead    Disposition:   FU with Kevin Space 3 months  Signed, Enzley Kitchens Meredith Leeds, MD  03/31/2019 9:10 AM     [facility] [address] [place] [address] [phone] (office) [phone] (fax)

## 2019-04-28 ENCOUNTER — Ambulatory Visit
Admission: RE | Admit: 2019-04-28 | Discharge: 2019-04-28 | Disposition: A | Discharge status: Home / Self Care | Payer: PPO | Source: Ambulatory Visit | Attending: Obstetrics and Gynecology | Admitting: Obstetrics and Gynecology

## 2019-04-28 ENCOUNTER — Other Ambulatory Visit: Payer: Self-pay

## 2019-04-28 DIAGNOSIS — Z1231 Encounter for screening mammogram for malignant neoplasm of breast: Secondary | ICD-10-CM | POA: Diagnosis not present

## 2019-05-07 DIAGNOSIS — Z6824 Body mass index (BMI) 24.0-24.9, adult: Secondary | ICD-10-CM | POA: Diagnosis not present

## 2019-05-07 DIAGNOSIS — Z01419 Encounter for gynecological examination (general) (routine) without abnormal findings: Secondary | ICD-10-CM | POA: Diagnosis not present

## 2019-05-25 ENCOUNTER — Telehealth: Payer: Self-pay

## 2019-05-25 ENCOUNTER — Encounter: Payer: Self-pay | Admitting: Internal Medicine

## 2019-05-25 ENCOUNTER — Ambulatory Visit (INDEPENDENT_AMBULATORY_CARE_PROVIDER_SITE_OTHER): Payer: PPO | Admitting: Internal Medicine

## 2019-05-25 DIAGNOSIS — R5383 Other fatigue: Secondary | ICD-10-CM | POA: Diagnosis not present

## 2019-05-25 DIAGNOSIS — I493 Ventricular premature depolarization: Secondary | ICD-10-CM | POA: Diagnosis not present

## 2019-05-25 NOTE — Telephone Encounter (Signed)
Appointment scheduled.

## 2019-05-25 NOTE — Progress Notes (Signed)
Virtual Visit via Video Note  I connected with Lisa Cox on 05/25/19 at  2:00 PM EDT by a video enabled telemedicine application and verified that I am speaking with the correct person using two identifiers.  The patient and the provider were at separate locations throughout the entire encounter.   I discussed the limitations of evaluation and management by telemedicine and the availability of in person appointments. The patient expressed understanding and agreed to proceed.  History of Present Illness: The patient is a 73 y.o. female with visit for weakness and concerns about her PVCs. She has seen cardiology and tried several medicines. She feels it is not working well and has been advised ablation. She is not wanting to do that before a second opinion. She has talked to a friend and got the name of a cardiologist she would like to consult with. She is feeling weak and tired in the last month. She has not had change in heart medicine in that time. Denies fevers or chills. Denies cough or SOB. Denies blood in stool or diarrhea/constipation or abdominal pain. Denies weight change. No labs recently. Sleeping about the same as normal.   Observations/Objective: Appearance: normal, breathing appears normal, casual grooming, abdomen does not appear distended, throat normal, memory good, mental status is A and O times 3  Assessment and Plan: See problem oriented charting  Follow Up Instructions: labs, referral to EP for second opinion PVCs  I discussed the assessment and treatment plan with the patient. The patient was provided an opportunity to ask questions and all were answered. The patient agreed with the plan and demonstrated an understanding of the instructions.   The patient was advised to call back or seek an in-person evaluation if the symptoms worsen or if the condition fails to improve as anticipated.  Hoyt Koch, MD

## 2019-05-25 NOTE — Telephone Encounter (Signed)
Either, as long as symptom free can be in office per patient preference.

## 2019-05-25 NOTE — Telephone Encounter (Signed)
Copied from [place] [id number]. Topic: General - Other >> May 25, 2019  8:40 AM Ivar Drape wrote: Reason for CRM:  Patient needs an appt because she has been very tired and weak for about a month now. >> May 25, 2019  9:00 AM Morphies, Isidoro Donning wrote: Faythe Ghee to schedule in office or would Dr Sharlet Salina prefer virtual?

## 2019-05-26 ENCOUNTER — Other Ambulatory Visit (INDEPENDENT_AMBULATORY_CARE_PROVIDER_SITE_OTHER): Payer: PPO

## 2019-05-26 ENCOUNTER — Telehealth: Payer: Self-pay | Admitting: Internal Medicine

## 2019-05-26 DIAGNOSIS — R5383 Other fatigue: Secondary | ICD-10-CM | POA: Insufficient documentation

## 2019-05-26 DIAGNOSIS — I493 Ventricular premature depolarization: Secondary | ICD-10-CM | POA: Diagnosis not present

## 2019-05-26 HISTORY — DX: Other fatigue: R53.83

## 2019-05-26 LAB — LIPID PANEL
Cholesterol: 201 mg/dL — ABNORMAL HIGH (ref 0–200)
HDL: 41.4 mg/dL (ref >39.00)
LDL Cholesterol: 130 mg/dL — ABNORMAL HIGH (ref 0–99)
NonHDL: 159.12
Total CHOL/HDL Ratio: 5
Triglycerides: 145 mg/dL (ref 0.0–149.0)
VLDL: 29 mg/dL (ref 0.0–40.0)

## 2019-05-26 LAB — COMPREHENSIVE METABOLIC PANEL
ALT: 17 U/L (ref 0–35)
AST: 18 U/L (ref 0–37)
Albumin: 4.1 g/dL (ref 3.5–5.2)
Alkaline Phosphatase: 77 U/L (ref 39–117)
BUN: 15 mg/dL (ref 6–23)
CO2: 29 mEq/L (ref 19–32)
Calcium: 9.2 mg/dL (ref 8.4–10.5)
Chloride: 101 mEq/L (ref 96–112)
Creatinine, Ser: 0.89 mg/dL (ref 0.40–1.20)
GFR: 61.76 mL/min (ref >60.00)
Glucose, Bld: 84 mg/dL (ref 70–99)
Potassium: 4 mEq/L (ref 3.5–5.1)
Sodium: 137 mEq/L (ref 135–145)
Total Bilirubin: 0.5 mg/dL (ref 0.2–1.2)
Total Protein: 6.5 g/dL (ref 6.0–8.3)

## 2019-05-26 LAB — CBC
HCT: 41.1 % (ref 36.0–46.0)
Hemoglobin: 13.6 g/dL (ref 12.0–15.0)
MCHC: 33.2 g/dL (ref 30.0–36.0)
MCV: 92.7 fl (ref 78.0–100.0)
Platelets: 183 10*3/uL (ref 150.0–400.0)
RBC: 4.43 Mil/uL (ref 3.87–5.11)
RDW: 13.4 % (ref 11.5–15.5)
WBC: 7.6 10*3/uL (ref 4.0–10.5)

## 2019-05-26 LAB — VITAMIN D 25 HYDROXY (VIT D DEFICIENCY, FRACTURES): VITD: 22.02 ng/mL — ABNORMAL LOW (ref 30.00–100.00)

## 2019-05-26 LAB — VITAMIN B12: Vitamin B-12: 270 pg/mL (ref 211–911)

## 2019-05-26 LAB — TSH: TSH: 6.41 u[IU]/mL — ABNORMAL HIGH (ref 0.35–4.50)

## 2019-05-26 LAB — HEMOGLOBIN A1C: Hgb A1c MFr Bld: 5.4 % (ref 4.6–6.5)

## 2019-05-26 NOTE — Assessment & Plan Note (Signed)
Checking labs today to rule out metabolic or other cause with B12, thyroid, CBC, CMP, vitamin D, HgA1c.

## 2019-05-26 NOTE — Telephone Encounter (Signed)
Opal Sidles calling from [facility] stated that she needs the referral resent to her. She would like a call if possible. Please ask to speak with jane.   [phone]  TJ#[phone]

## 2019-05-26 NOTE — Telephone Encounter (Signed)
Referral faxed to [phone]

## 2019-05-26 NOTE — Assessment & Plan Note (Signed)
Referral to cardiology for second opinion as she does not want to contemplate ablation until all options are exhausted with medicines.

## 2019-06-02 ENCOUNTER — Telehealth: Payer: Self-pay | Admitting: Cardiology

## 2019-06-02 MED ORDER — AMLODIPINE BESYLATE 2.5 MG PO TABS: 2.5000 mg | ORAL_TABLET | Freq: Every day | ORAL | 6 refills | Status: DC

## 2019-06-02 NOTE — Telephone Encounter (Signed)
Returned call to patient she stated B/P has been elevated readings listed below.Pulse [id number].Stated she noticed left side of face is red,no swelling.Spoke to DOD Dr.Croitoru he advised add Amlodipine 2.5 mg daily.Continue all other medications.Keep appointment with Dr.Harding on 9/2,bring B/P readings and all medications to appointment.

## 2019-06-02 NOTE — Telephone Encounter (Signed)
  Pt c/o BP issue: STAT if pt c/o blurred vision, one-sided weakness or slurred speech  1. What are your last 5 BP readings?   182/82 188/86 148/74 149/73 156/83  2. Are you having any other symptoms (ex. Dizziness, headache, blurred vision, passed out)? One side of her face was red, slight headache   3. What is your BP issue? Patient states her blood pressure has been up and seems to go up highest later in the day.

## 2019-06-08 DIAGNOSIS — Z7982 Long term (current) use of aspirin: Secondary | ICD-10-CM | POA: Diagnosis not present

## 2019-06-08 DIAGNOSIS — I493 Ventricular premature depolarization: Secondary | ICD-10-CM | POA: Diagnosis not present

## 2019-06-08 DIAGNOSIS — I10 Essential (primary) hypertension: Secondary | ICD-10-CM | POA: Diagnosis not present

## 2019-06-08 DIAGNOSIS — Z79899 Other long term (current) drug therapy: Secondary | ICD-10-CM | POA: Diagnosis not present

## 2019-06-09 ENCOUNTER — Other Ambulatory Visit: Payer: Self-pay

## 2019-06-09 ENCOUNTER — Emergency Department (HOSPITAL_COMMUNITY)
Admission: EM | Admit: 2019-06-09 | Discharge: 2019-06-09 | Disposition: A | Discharge status: Home / Self Care | Payer: PPO | Attending: Emergency Medicine | Admitting: Emergency Medicine

## 2019-06-09 ENCOUNTER — Other Ambulatory Visit: Payer: Self-pay | Admitting: Cardiology

## 2019-06-09 ENCOUNTER — Other Ambulatory Visit: Payer: Self-pay | Admitting: Internal Medicine

## 2019-06-09 ENCOUNTER — Emergency Department (HOSPITAL_COMMUNITY): Payer: PPO

## 2019-06-09 DIAGNOSIS — R519 Headache, unspecified: Secondary | ICD-10-CM

## 2019-06-09 DIAGNOSIS — Z79899 Other long term (current) drug therapy: Secondary | ICD-10-CM | POA: Insufficient documentation

## 2019-06-09 DIAGNOSIS — I10 Essential (primary) hypertension: Secondary | ICD-10-CM

## 2019-06-09 DIAGNOSIS — R51 Headache: Secondary | ICD-10-CM | POA: Diagnosis not present

## 2019-06-09 DIAGNOSIS — Z7982 Long term (current) use of aspirin: Secondary | ICD-10-CM | POA: Diagnosis not present

## 2019-06-09 LAB — CBC
HCT: 40.9 % (ref 36.0–46.0)
Hemoglobin: 13.7 g/dL (ref 12.0–15.0)
MCH: 31 pg (ref 26.0–34.0)
MCHC: 33.5 g/dL (ref 30.0–36.0)
MCV: 92.5 fL (ref 80.0–100.0)
Platelets: 196 10*3/uL (ref 150–400)
RBC: 4.42 MIL/uL (ref 3.87–5.11)
RDW: 12.7 % (ref 11.5–15.5)
WBC: 8 10*3/uL (ref 4.0–10.5)
nRBC: 0 % (ref 0.0–0.2)

## 2019-06-09 LAB — BASIC METABOLIC PANEL
Anion gap: 10 (ref 5–15)
BUN: 13 mg/dL (ref 8–23)
CO2: 27 mmol/L (ref 22–32)
Calcium: 9.2 mg/dL (ref 8.9–10.3)
Chloride: 101 mmol/L (ref 98–111)
Creatinine, Ser: 0.81 mg/dL (ref 0.44–1.00)
GFR calc Af Amer: >60 mL/min (ref >60)
GFR calc non Af Amer: >60 mL/min (ref >60)
Glucose, Bld: 89 mg/dL (ref 70–99)
Potassium: 3.7 mmol/L (ref 3.5–5.1)
Sodium: 138 mmol/L (ref 135–145)

## 2019-06-09 NOTE — ED Provider Notes (Signed)
[facility] EMERGENCY DEPARTMENT Provider Note   CSN: [id number] Arrival date & time: 06/09/19  1309     History   Chief Complaint Chief Complaint  Patient presents with  . Hypertension  . Headache    HPI Lisa Cox is a 73 y.o. female who presents with high blood pressure and a headache. PMH significant for HTN, HLD, PVCs, GERD, anxiety/depression, hx of breast cancer. She states that for the past week she has had an intermittent headache and facial flushing. Her daughter is in the room and thinks it may be related to her blood pressure. She will feel a pressure behind her head and then sharp pains that are scattered throughout her head. She has not tried anything for pain. She believes her symptoms are worse when BP is elevated. She called the on call cardiologist last week due to high BP and was started on a low dose of Amlodipine. This has not helped. She saw a new EP doctor at [facility] who increased her atenolol and and mexiletine and is going to have her set up for holter monitoring. She is wondering if this med may be making her BP go up because this was listed as a side effect. She denies current headache, dizziness, syncope, vision changes, unilateral weakness, slurred speech, or facial droop. No chest pain or SOB although she continues to have PVCs. She states she tries to eat a low salt diet and has actually lost 10 pounds intentionally due to dieting.     HPI  Past Medical History:  Diagnosis Date  . Anxiety   . Bell's palsy 2009  . Cancer ([place]) Feb.2011   breast,lumpectomy right side followed by radiation  . Depression   . GERD (gastroesophageal reflux disease)   . Hyperlipidemia   . Hypertension   . Palpitations    Frequent PVCs with some bigeminy noted on 48-hour monitor. No arrhythmias.  . Personal history of radiation therapy     Patient Active Problem List   Diagnosis Date Noted  . Fatigue 05/26/2019  . Ventricular premature beats  originating in outflow tract of right ventricle 10/31/2018  . Routine general medical examination at a health care facility 07/12/2017  . Essential hypertension 07/12/2017  . Frequent unifocal PVCs 04/02/2017  . Ventricular bigeminy seen on cardiac monitor 05/26/2016  . Insomnia 04/10/2016  . History of breast cancer 09/29/2011  . Hypercholesterolemia 07/11/2011  . Benign hypertensive heart disease without heart failure 07/11/2011  . Palpitations     Past Surgical History:  Procedure Laterality Date  . BREAST LUMPECTOMY Right Feb. 2011   right breast,followed by radiation therapy  . CARDIAC EVENT MONITOR     Predominantly sinus rhythm with first-degree block. Minimum heart rate 63 bpm. Maximum heart rate 139 bpm. Average is 85 bpm. 17.5% PVCs.  V bigem & Trigem noted.    [facility] EYE SURGERY  1953   skin removal in eye  . GXT  04/2017   Exercise tolerance test off of beta-blockers: Normal blood pressure response to exercise.  No EKG changes to suggest ischemic changes.  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  Seen at rest, exercise and recovery.  [facility] NM MYOVIEW LTD  08/2018   LOW RISK. No ischemia or infarction  . TRANSTHORACIC ECHOCARDIOGRAM  08/2018   EF 55-60%.  No RWMA. Gr 2 DD. Mod AI.  [facility] TUBAL LIGATION  1978     OB History   No obstetric history on file.  Home Medications    Prior to Admission medications   Medication Sig Start Date End Date Taking? Authorizing Provider  acetaminophen (TYLENOL) 500 MG tablet Take 1,000 mg by mouth every 6 (six) hours as needed. For cramping.    [provider]  amLODipine (NORVASC) 2.5 MG tablet Take 1 tablet (2.5 mg total) by mouth daily. 06/02/19 08/31/19  Croitoru, Mihai, MD  aspirin 81 MG tablet Take 81 mg by mouth daily as needed (blood thinner). Does not take on a daily basis    [provider]  atenolol (TENORMIN) 25 MG tablet TAKE 1 TABLET BY MOUTH TWICE A DAY OR AS DIRECTED BY YOUR DOCTOR 06/09/19    Leonie Man, MD  b complex vitamins tablet Take 1 tablet by mouth daily as needed (supplement).     [provider]  Calcium Citrate-Vitamin D (CALCIUM + D PO) Take 2 tablets by mouth daily.    [provider]  carboxymethylcellulose (REFRESH TEARS) 0.5 % SOLN Place 1 drop into both eyes 3 (three) times daily as needed (dry eye).     [provider]  dimenhyDRINATE (DRAMAMINE) 50 MG tablet Take 50 mg by mouth every 8 (eight) hours as needed. Reported on 04/19/2016    [provider]  famotidine (PEPCID) 10 MG tablet Take 10 mg by mouth at bedtime.    [provider]  LORazepam (ATIVAN) 1 MG tablet TAKE 1 TABLET BY MOUTH EVERYDAY AT BEDTIME 06/09/19   Marrian Salvage, FNP  mexiletine (MEXITIL) 150 MG capsule TAKE 1 CAPSULE BY MOUTH TWICE A DAY 03/17/19   Camnitz, Ocie Doyne, MD  Multiple Vitamins-Minerals (PRESERVISION AREDS 2) CAPS Take 2 capsules by mouth daily.    [provider]  Omega-3 Fatty Acids (FISH OIL CONCENTRATE PO) Take 1 capsule by mouth daily.     [provider]  phenazopyridine (PYRIDIUM) 100 MG tablet Take 1 tablet (100 mg total) by mouth 3 (three) times daily as needed for pain. 12/02/17   Marrian Salvage, FNP  ramipril (ALTACE) 10 MG capsule Take 2 capsules (20 mg total) by mouth daily. 03/24/19   Leonie Man, MD    Family History Family History  Problem Relation Age of Onset  . Heart disease Father   . Heart attack Father   . Pancreatic cancer Father   . Hypertension Sister   . Hypertension Brother   . Stroke Maternal Aunt   . Breast cancer Paternal Aunt 70       x 2    Social History Social History   Tobacco Use  . Smoking status: Never Smoker  . Smokeless tobacco: Never Used  Substance Use Topics  . Alcohol use: No  . Drug use: No     Allergies   Amlodipine, Crestor [rosuvastatin calcium], Erythromycin, Macrobid [nitrofurantoin monohyd macro], Niacin and related, Restoril,  and Zolpidem tartrate   Review of Systems Review of Systems  Constitutional: Negative for fever.  Eyes: Negative for visual disturbance.  Respiratory: Negative for shortness of breath.   Cardiovascular: Positive for palpitations. Negative for chest pain.  Neurological: Positive for headaches. Negative for dizziness, syncope, weakness, light-headedness and numbness.  All other systems reviewed and are negative.    Physical Exam Updated Vital Signs BP (!) 154/78 (BP Location: Right Arm)   Pulse 82   Temp 98 F (36.7 C) (Oral)   Resp 16   Ht 5\' 5"  (1.651 m)   Wt 68 kg   SpO2 96%   BMI 24.96  kg/m   Physical Exam Vitals signs and nursing note reviewed.  Constitutional:      General: She is not in acute distress.    Appearance: She is well-developed. She is not ill-appearing.     Comments: Elderly female in NAD  HENT:     Head: Normocephalic and atraumatic.  Eyes:     General: No scleral icterus.       Right eye: No discharge.        Left eye: No discharge.     Conjunctiva/sclera: Conjunctivae normal.     Pupils: Pupils are equal, round, and reactive to light.  Neck:     Musculoskeletal: Normal range of motion.  Cardiovascular:     Rate and Rhythm: Normal rate and regular rhythm.  Pulmonary:     Effort: Pulmonary effort is normal. No respiratory distress.     Breath sounds: Normal breath sounds.  Abdominal:     General: There is no distension.  Skin:    General: Skin is warm and dry.  Neurological:     Mental Status: She is alert and oriented to person, place, and time.     Comments: Lying on stretcher in NAD. GCS 15. Speaks in a clear voice. Cranial nerves II through XII grossly intact. 5/5 strength in all extremities. Sensation fully intact.  Bilateral finger-to-nose intact. Ambulatory   Psychiatric:        Behavior: Behavior normal.      ED Treatments / Results  Labs (all labs ordered are listed, but only abnormal results are displayed) Labs Reviewed   BASIC METABOLIC PANEL  CBC    EKG None  Radiology Dg Chest 2 View  Result Date: 06/09/2019 CLINICAL DATA:  Hypertension. Headache. EXAM: CHEST - 2 VIEW COMPARISON:  Chest x-ray dated 09/28/2011 FINDINGS: The heart size and mediastinal contours are within normal limits. Both lungs are clear. The visualized skeletal structures are unremarkable. Aortic atherosclerosis. IMPRESSION: 1. No active cardiopulmonary disease. 2. Aortic atherosclerosis. Electronically Signed   By: Lorriane Shire M.D.   On: 06/09/2019 14:11    Procedures Procedures (including critical care time)  Medications Ordered in ED Medications - No data to display   Initial Impression / Assessment and Plan / ED Course  I have reviewed the triage vital signs and the nursing notes.  Pertinent labs & imaging results that were available during my care of the patient were reviewed by me and considered in my medical decision making (see chart for details).  73 year old female with elevated BP and headaches. BP is elevated here but not to the level of HTN emergency. Labs are unremarkable. Neuro exam is normal. CT head was obtained and is negative. CXR is negative. Unclear etiology of elevated BP but likely non-emergent. Shared visit with Dr. Laverta Baltimore. It was discussed that the patient should follow a DASH diet, stop checking her BP multiple times a day, and to f/u with her cardiologist who has been managing her meds. Return precautions given.  Final Clinical Impressions(s) / ED Diagnoses   Final diagnoses:  Hypertension, unspecified type  Acute nonintractable headache, unspecified headache type    ED Discharge Orders    None       Recardo Evangelist, PA-C 06/09/19 2055    Margette Fast, MD 06/10/19 1016

## 2019-06-09 NOTE — Discharge Instructions (Addendum)
Please follow up with Dr. Ellyn Hack and your EP doctor to go over your medicines Only check your blood pressure once a day Please follow the DASH diet to keep salt levels down Return if you develop a severe, constant headache, chest pain, or your pass out

## 2019-06-09 NOTE — ED Triage Notes (Signed)
Pt here for hypertension and headache x 1 week. Pt has recently changed providers and has had some changes in her medications but reports compliance.

## 2019-06-09 NOTE — Telephone Encounter (Signed)
Please advise per Dr. Nathanial Millman absence thank you   Control database checked last refill: 05/11/2019 30 tabs LOV: 05/25/2019 NOV: none

## 2019-06-09 NOTE — ED Notes (Signed)
Pt denies any headache at this time.  St's the head pain comes and goes.  Pt thinks this is because of her medication change.

## 2019-06-10 ENCOUNTER — Telehealth: Payer: Self-pay | Admitting: Cardiology

## 2019-06-10 NOTE — Telephone Encounter (Signed)
Returned call to pt she states that she went to the ER last d/t her BP being high she states that it has been 170's but when she got into the ER it was down to 154/78 HR 82 she states that they did not change or give her anything. She states that her BP today is 112/65. Told pt to take and log her BP BID-TiD and bring log with her to f/u appt and to make sure to call if it is elevated again. Pt expresses thanks for the call and will CB if anything is needed or go back to the ER for evaluation

## 2019-06-10 NOTE — Telephone Encounter (Signed)
New Message:     Pt said she was seen in [facility] ER last night for her blood pressure. She said she was told to follow up with Dr Ellyn Hack. Shesays she already have an appointment on 06-24-19. Her question should she be seen before 07-12-19 or just keep that appointment on 06-24-19? Blood Pressure is down this morning, it was 112/65 and it was up in170 and 180. It seems worse in the afternoon.

## 2019-06-17 ENCOUNTER — Encounter (HOSPITAL_BASED_OUTPATIENT_CLINIC_OR_DEPARTMENT_OTHER): Payer: PPO | Admitting: Cardiology

## 2019-06-18 ENCOUNTER — Telehealth: Payer: Self-pay | Admitting: Cardiology

## 2019-06-18 NOTE — Telephone Encounter (Signed)
New Message   Patient is calling in to get information on scheduling an ablation. Please give patient a call back.

## 2019-06-18 NOTE — Telephone Encounter (Signed)
Informed pt that we would discuss ablation more at [facility] on 9/17.  Pt aware it may be Oct/Nov before procedure can be scheduled, but that I would place a note for possible spot in October depending on OV on 9/17. Pt agreeable to plan.

## 2019-06-24 ENCOUNTER — Ambulatory Visit: Payer: PPO | Admitting: Cardiology

## 2019-06-24 ENCOUNTER — Other Ambulatory Visit: Payer: Self-pay

## 2019-06-24 ENCOUNTER — Encounter: Payer: Self-pay | Admitting: Cardiology

## 2019-06-24 VITALS — BP 187/77 | HR 86 | Ht 65.0 in | Wt 151.6 lb

## 2019-06-24 DIAGNOSIS — I493 Ventricular premature depolarization: Secondary | ICD-10-CM | POA: Diagnosis not present

## 2019-06-24 DIAGNOSIS — I119 Hypertensive heart disease without heart failure: Secondary | ICD-10-CM

## 2019-06-24 DIAGNOSIS — E7800 Pure hypercholesterolemia, unspecified: Secondary | ICD-10-CM

## 2019-06-24 MED ORDER — ATENOLOL 50 MG PO TABS: 50.0000 mg | ORAL_TABLET | Freq: Two times a day (BID) | ORAL | 3 refills | Status: DC

## 2019-06-24 MED ORDER — ATORVASTATIN CALCIUM 20 MG PO TABS: 20.0000 mg | ORAL_TABLET | Freq: Every day | ORAL | 3 refills | Status: DC

## 2019-06-24 NOTE — Progress Notes (Signed)
PCP: Hoyt Koch, MD  EP: Dr. Curt Bears  Clinic Note: Chief Complaint  Patient presents with   Follow-up   Palpitations    RVOT PVCs   Hypertension    Somewhat labile    HPI: Lisa Cox is a 73 y.o. female who is being seen today for six-month follow-up evaluation of frequent RVOT PVCs. Lisa Cox is a former patient of Dr. Warren Danes whom he last saw on 12/19/2015.  Lisa Cox was last seen by me in January 2020 --> she noted that her palpitations were a little bit better, but did not really do well with the flecainide.  She was relatively euvolemic.  We increased atenolol to 50 mg the morning and 20 5 at night.  Continued current dose of ramipril. (Did not tolerate diltiazem, or as needed short acting beta blockers)  - 03/31/2019 - discussed PVC ablation with Dr. Earlyne Iba.  Confirmed that PVCs are RVOT in nature.  Apparently was taken off of mexiletine.  Is now only on beta-blocker.  [facility] EP - Dr. Dossie Der - stated 25 mg Atenolol & Mexilitine 150 mg BID. She never restarted Mexilitine (BP was going up).  Did not do 14 d monitor.  Recent Hospitalizations:   ER evaluation June 09, 2019.  Noted atypical headaches.  Ruled out for stroke.  Ruled out for temporal arteritis.  Studies Personally Reviewed - (if available, images/films reviewed: From Epic Chart or Care Everywhere)  Cardiac Event Monitor October 2019: Predominantly sinus rhythm with first-degree block. Minimum heart rate 63 bpm. Maximum heart rate 139 bpm. Average is 85 bpm. 17.5% PVCs.  V bigem & Trigem noted.    Interval History: Lisa Cox returns today a little bit confused.  She has decided that she does not want to go back to follow-up with [facility] EP and would prefer to just stay with Dr. Curt Bears here locally.   Blood pressure has definitely gone up some.  She never really did get started on the amlodipine.  She really does not notice that much of the PVCs unless her blood pressure is up.  Which  she feels is pulsating palpitations, both sides of her head.  There really off and on with no real regularity to them.  He does really like her heart beating fast just she feels the pounding in the pulsations.  She has had some headaches, but no syncope or near syncope.  No TIA or amaurosis fugax symptoms.  Cardiovascular review of symptoms: no chest pain or dyspnea on exertion positive for - palpitations and Headache, some fatigue and maybe some exertional shortness of breath when her blood pressure is high. negative for - irregular heartbeat, orthopnea, paroxysmal nocturnal dyspnea, rapid heart rate or shortness of breath  ROS: A comprehensive was performed. Review of Systems  Constitutional: Negative for malaise/fatigue.  HENT: Negative for congestion and nosebleeds.   Respiratory: Positive for shortness of breath (Baseline).   Cardiovascular:       Per history of present illness  Gastrointestinal: Negative for abdominal pain, blood in stool, heartburn, melena and nausea.  Genitourinary: Negative for hematuria.  Musculoskeletal: Positive for joint pain. Negative for myalgias.  Neurological: Negative for dizziness.  Endo/Heme/Allergies: Negative for environmental allergies.  Psychiatric/Behavioral: The patient is nervous/anxious.   All other systems reviewed and are negative.  I have reviewed and (if needed) personally updated the patient's problem list, medications, allergies, past medical and surgical history, social and family history.   Past Medical History:  Diagnosis Date  Anxiety    Bell's palsy 2009   Cancer [facility]) Feb.2011   breast,lumpectomy right side followed by radiation   Depression    GERD (gastroesophageal reflux disease)    Hyperlipidemia    Hypertension    Palpitations    Frequent PVCs with some bigeminy noted on 48-hour monitor. No arrhythmias.   Personal history of radiation therapy     Past Surgical History:  Procedure Laterality Date   BREAST  LUMPECTOMY Right Feb. 2011   right breast,followed by radiation therapy   CARDIAC EVENT MONITOR     Predominantly sinus rhythm with first-degree block. Minimum heart rate 63 bpm. Maximum heart rate 139 bpm. Average is 85 bpm. 17.5% PVCs.  V bigem & Trigem noted.     EYE SURGERY  1953   skin removal in eye   GXT  04/2017   Exercise tolerance test off of beta-blockers: Normal blood pressure response to exercise.  No EKG changes to suggest ischemic changes.  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  Seen at rest, exercise and recovery.   NM MYOVIEW LTD  08/2018   LOW RISK. No ischemia or infarction   TRANSTHORACIC ECHOCARDIOGRAM  08/2018   EF 55-60%.  No RWMA. Gr 2 DD. Mod AI.   TUBAL LIGATION  1978    Current Meds  Medication Sig   acetaminophen (TYLENOL) 500 MG tablet Take 1,000 mg by mouth every 6 (six) hours as needed. For cramping.   aspirin 81 MG tablet Take 81 mg by mouth daily as needed (blood thinner). Does not take on a daily basis   b complex vitamins tablet Take 1 tablet by mouth daily as needed (supplement).    Calcium Citrate-Vitamin D (CALCIUM + D PO) Take 2 tablets by mouth daily.   carboxymethylcellulose (REFRESH TEARS) 0.5 % SOLN Place 1 drop into both eyes 3 (three) times daily as needed (dry eye).    dimenhyDRINATE (DRAMAMINE) 50 MG tablet Take 50 mg by mouth every 8 (eight) hours as needed. Reported on 04/19/2016   famotidine (PEPCID) 10 MG tablet Take 10 mg by mouth at bedtime.   LORazepam (ATIVAN) 1 MG tablet TAKE 1 TABLET BY MOUTH EVERYDAY AT BEDTIME   mexiletine (MEXITIL) 150 MG capsule TAKE 1 CAPSULE BY MOUTH TWICE A DAY   Multiple Vitamins-Minerals (PRESERVISION AREDS 2) CAPS Take 2 capsules by mouth daily.   Omega-3 Fatty Acids (FISH OIL CONCENTRATE PO) Take 1 capsule by mouth daily.    phenazopyridine (PYRIDIUM) 100 MG tablet Take 1 tablet (100 mg total) by mouth 3 (three) times daily as needed for pain.   ramipril (ALTACE) 10 MG  capsule Take 2 capsules (20 mg total) by mouth daily.   [DISCONTINUED] atenolol (TENORMIN) 25 MG tablet TAKE 1 TABLET BY MOUTH TWICE A DAY OR AS DIRECTED BY YOUR DOCTOR  - stopped Bactrim after UTI  Allergies  Allergen Reactions   Ambien  [Zolpidem Tartrate]    Amlodipine     Peripheral edema   Crestor  [Rosuvastatin Calcium]    Crestor [Rosuvastatin Calcium]     Leg cramps   Erythromycin     nausea   Macrobid [Nitrofurantoin Monohyd Macro] Nausea And Vomiting   Macrodantin  [Nitrofurantoin Macrocrystal]    Niacin And Related Itching   Restoril     Reaction-"nervous"   Zolpidem Tartrate     Reaction-"nervous"    Social History   Tobacco Use   Smoking status: Never Smoker   Smokeless tobacco: Never Used  Substance Use Topics  Alcohol use: No   Drug use: No   Social History   Social History Narrative   Not on file   Family History family history includes Breast cancer (age of onset: 59) in her paternal aunt; Heart attack in her father; Heart disease in her father; Hypertension in her brother and sister; Pancreatic cancer in her father; Stroke in her maternal aunt.  Wt Readings from Last 3 Encounters:  06/24/19 151 lb 9.6 oz (68.8 kg)  06/09/19 150 lb (68 kg)  03/31/19 151 lb (68.5 kg)    PHYSICAL EXAM BP (!) 187/77    Pulse 86    Ht 5\' 5"  (1.651 m)    Wt 151 lb 9.6 oz (68.8 kg)    SpO2 97%    BMI 25.23 kg/m  Previous evaluation on August 19 showed blood pressure 117/65 and she is also recorded 134/79 mmHg. Physical Exam  Constitutional: She is oriented to person, place, and time. She appears well-developed and well-nourished. No distress.  Well-groomed.  Healthy-appearing  HENT:  Head: Normocephalic and atraumatic.  Neck: Normal range of motion. Neck supple. No hepatojugular reflux and no JVD present. Carotid bruit is not present.  Cardiovascular: Normal rate, regular rhythm, normal heart sounds and normal pulses. Frequent extrasystoles are  present. PMI is not displaced. Exam reveals no gallop and no friction rub.  No murmur heard. Pulmonary/Chest: Breath sounds normal. No respiratory distress. She has no wheezes. She has no rales.  Musculoskeletal: Normal range of motion.        General: No edema.  Neurological: She is alert and oriented to person, place, and time. No cranial nerve deficit.  Psychiatric: She has a normal mood and affect. Her behavior is normal. Judgment and thought content normal.  Nursing note and vitals reviewed.   Adult ECG Report n/a  Other studies Reviewed: Additional studies/ records that were reviewed today include:  Recent Labs:  Managed by PCP Lab Results  Component Value Date   CREATININE 0.81 06/09/2019   BUN 13 06/09/2019   NA 138 06/09/2019   K 3.7 06/09/2019   CL 101 06/09/2019   CO2 27 06/09/2019   Lab Results  Component Value Date   CHOL 201 (H) 05/26/2019   HDL 41.40 05/26/2019   LDLCALC 130 (H) 05/26/2019   TRIG 145.0 05/26/2019   CHOLHDL 5 05/26/2019    ASSESSMENT / PLAN: Problem List Items Addressed This Visit    Benign hypertensive heart disease without heart failure (Chronic)    Euvolemic on exam.  No significant edema or orthopnea symptoms.  She is on adequate dose of ACE inhibitor which I recommended that she takes 10 mg twice daily as opposed 20 mg together.  Increasing atenolol to 50 mg twice daily.  Not requiring diuretic.      Relevant Medications   atenolol (TENORMIN) 50 MG tablet   atorvastatin (LIPITOR) 20 MG tablet   Hypercholesterolemia (Chronic)    Lipids just checked showed LDL of 130.  She has not risk factors that I think we need to please get it below 100.  Will start atorvastatin 20 mg daily.  She will start 1 month after increasing the dose of atenolol to avoid confusion possible fatigue from the beta-blocker with effects of statin.      Relevant Medications   atenolol (TENORMIN) 50 MG tablet   atorvastatin (LIPITOR) 20 MG tablet   Ventricular  premature beats originating in outflow tract of right ventricle - Primary (Chronic)    He is due to  follow back up with Dr. Curt Bears to discuss possibility of ablation.  He did not tolerate flecainide or mexiletine. Plan: Increase atenolol to 50 mg twice daily      Relevant Medications   atenolol (TENORMIN) 50 MG tablet   atorvastatin (LIPITOR) 20 MG tablet      Current medicines are reviewed at length with the patient today. (+/- concerns) n/a The following changes have been made: No change  Patient Instructions  Medication Instructions:   INCREASE ATENOLOL TO 50 MG - TAKE TWICE A DAY   ON Jul 24, 2019 - START  ATORVASTATIN 20 MG  ONE TABLET DAILY   If you need a refill on your cardiac medications before your next appointment, please call your pharmacy.   Lab work: NOT NEEDED   Testing/Procedures:  NOT NEEDED Follow-Up: At [facility], you and your health needs are our priority.  As part of our continuing mission to provide you with exceptional heart care, we have created designated Provider Care Teams.  These Care Teams include your primary Cardiologist (physician) and Advanced Practice Providers (APPs -  Physician Assistants and Nurse Practitioners) who all work together to provide you with the care you need, when you need it.  You will need a follow up appointment in  6 months- MARCH 2020.  Please call our office 2 months in advance to schedule this appointment.  You may see Glenetta Hew, MD or one of the following Advanced Practice Providers on your designated Care Team:    Rosaria Ferries, PA-C  Jory Sims, DNP, ANP  Any Other Special Instructions Will Be Listed Below (If Applicable).   Studies Ordered:   No orders of the defined types were placed in this encounter. [facility]    Glenetta Hew, M.D., M.S. Interventional Cardiologist   Pager # [phone] Phone # [phone] [address] [place], [place] [address]

## 2019-06-24 NOTE — Patient Instructions (Signed)
Medication Instructions:   INCREASE ATENOLOL TO 50 MG - TAKE TWICE A DAY   ON Jul 24, 2019 - START  ATORVASTATIN 20 MG  ONE TABLET DAILY   If you need a refill on your cardiac medications before your next appointment, please call your pharmacy.   Lab work: NOT NEEDED   Testing/Procedures:  NOT NEEDED Follow-Up: At [facility], you and your health needs are our priority.  As part of our continuing mission to provide you with exceptional heart care, we have created designated Provider Care Teams.  These Care Teams include your primary Cardiologist (physician) and Advanced Practice Providers (APPs -  Physician Assistants and Nurse Practitioners) who all work together to provide you with the care you need, when you need it. . You will need a follow up appointment in  6 months- MARCH 2020.  Please call our office 2 months in advance to schedule this appointment.  You may see Glenetta Hew, MD or one of the following Advanced Practice Providers on your designated Care Team:   . Rosaria Ferries, PA-C . Jory Sims, DNP, ANP  Any Other Special Instructions Will Be Listed Below (If Applicable).

## 2019-07-01 ENCOUNTER — Encounter: Payer: Self-pay | Admitting: Cardiology

## 2019-07-01 ENCOUNTER — Other Ambulatory Visit: Payer: Self-pay

## 2019-07-01 ENCOUNTER — Ambulatory Visit (INDEPENDENT_AMBULATORY_CARE_PROVIDER_SITE_OTHER): Payer: PPO

## 2019-07-01 ENCOUNTER — Ambulatory Visit: Payer: PPO | Admitting: Cardiology

## 2019-07-01 DIAGNOSIS — Z23 Encounter for immunization: Secondary | ICD-10-CM

## 2019-07-01 NOTE — Assessment & Plan Note (Signed)
He is due to follow back up with Dr. Curt Bears to discuss possibility of ablation.  He did not tolerate flecainide or mexiletine. Plan: Increase atenolol to 50 mg twice daily

## 2019-07-01 NOTE — Assessment & Plan Note (Signed)
Lipids just checked showed LDL of 130.  She has not risk factors that I think we need to please get it below 100.  Will start atorvastatin 20 mg daily.  She will start 1 month after increasing the dose of atenolol to avoid confusion possible fatigue from the beta-blocker with effects of statin.

## 2019-07-01 NOTE — Assessment & Plan Note (Signed)
Euvolemic on exam.  No significant edema or orthopnea symptoms.  She is on adequate dose of ACE inhibitor which I recommended that she takes 10 mg twice daily as opposed 20 mg together.  Increasing atenolol to 50 mg twice daily.  Not requiring diuretic.

## 2019-07-07 ENCOUNTER — Telehealth: Payer: Self-pay | Admitting: Cardiology

## 2019-07-07 NOTE — Telephone Encounter (Signed)
New Message:   Pt has an appt on Thursday(07-09-19). She wants to know if her daughter can go in with her for her appt please?

## 2019-07-07 NOTE — Telephone Encounter (Signed)
Pt calling to see if her daughter can accompany her during her visit. She denies any memory issues or HOH. I advised her we are still trying to limit the amount of visitors in the office at this time. She may use her cell phone and speaker to call her daughter during her appt if she would like.   She agrees with this plan and had no additional questions.

## 2019-07-09 ENCOUNTER — Encounter: Payer: Self-pay | Admitting: Cardiology

## 2019-07-09 ENCOUNTER — Other Ambulatory Visit: Payer: Self-pay

## 2019-07-09 ENCOUNTER — Ambulatory Visit: Payer: PPO | Admitting: Cardiology

## 2019-07-09 VITALS — BP 140/82 | HR 79 | Ht 65.0 in | Wt 152.8 lb

## 2019-07-09 DIAGNOSIS — I493 Ventricular premature depolarization: Secondary | ICD-10-CM | POA: Diagnosis not present

## 2019-07-09 DIAGNOSIS — Z01812 Encounter for preprocedural laboratory examination: Secondary | ICD-10-CM | POA: Diagnosis not present

## 2019-07-09 NOTE — Patient Instructions (Addendum)
Medication Instructions:  Your physician recommends that you continue on your current medications as directed. Please refer to the Current Medication list given to you today.  * If you need a refill on your cardiac medications before your next appointment, please call your pharmacy.   Labwork: Your physician recommends that you return for lab work between 9/30 - 10/23 If you have labs (blood work) drawn today and your tests are completely normal, you will receive your results only by:  [place] (if you have MyChart) OR  A paper copy in the mail If you have any lab test that is abnormal or we need to change your treatment, we will call you to review the results.  Testing/Procedures: Your physician has recommended that you have an ablation on 08/19/19. Catheter ablation is a medical procedure used to treat some cardiac arrhythmias (irregular heartbeats). During catheter ablation, a long, thin, flexible tube is put into a blood vessel in your groin (upper thigh), or neck. This tube is called an ablation catheter. It is then guided to your heart through the blood vessel. Radio frequency waves destroy small areas of heart tissue where abnormal heartbeats may cause an arrhythmia to start. Please see the instruction below.  Follow-Up: Your physician recommends that you schedule a follow-up appointment in: 4 weeks after your procedure on 08/19/19, with Dr. Curt Bears   Thank you for choosing [facility]!!   Trinidad Curet, RN [phone]  Any Other Special Instructions Will Be Listed Below (If Applicable).    Electrophysiology/Ablation Procedure Instructions   You are scheduled for a(n)  ablation on 08/19/19 with Dr. Allegra Lai.   1.   Pre procedure testing-             A.  LAB WORK --- On __________. You do not have to be fasting.              B. COVID TEST-- On ________ @ _______  after you get your lab work- You will go to [facility] ([place])  for your Covid testing.   This is a drive thru test site.  There will be multiple testing areas.  Be sure to share with the first checkpoint that you are there for pre-procedure/surgery testing. This will put you into the right (yellow) lane that leads to the PAT testing team. Stay in your car and the nurse team will come to your car to test you.  After you are tested please go home and self quarantine until the day of your procedure.     2. On the day of your procedure 08/19/19 you will go to [facility] [phone]) at 6:30 a.m..  You will go to the main entrance [address] [employer]) and enter where the [employer] are.  Your driver will drop you off and you will head down the hallway to ADMITTING.  You may have one support person come in to the hospital with you.  They will be asked to wait in the waiting room.   3.   Do not eat or drink after midnight prior to your procedure.   4.   Do NOT take any medications the morning of your procedure.   5.  Plan for an overnight stay.  If you use your phone frequently bring your phone charger.   6. You will follow up with  Dr. Curt Bears 4 weeks after your procedure.     * If you have ANY questions  please call the office [phone] and ask for Sherri RN or send me a MyChart message   * Occasionally, EP Studies and ablations can become lengthy.  Please make your family aware of this before your procedure starts.  Average time ranges from 2-8 hours for EP studies/ablations.  Your physician will call your family after the procedure with the results.

## 2019-07-09 NOTE — Progress Notes (Signed)
Electrophysiology Office Note   Date:  07/09/2019   ID:  Lisa Cox, DOB 1946-06-17, MRN [id number]  PCP:  Lisa Koch, MD  Cardiologist:  Lisa Cox Primary Electrophysiologist:  Lisa Meredith Leeds, MD    No chief complaint on file.    History of Present Illness: Lisa Cox is a 73 y.o. female who is being seen today for the evaluation of PVCs at the request of Lisa Cox. Presenting today for electrophysiology evaluation.  Is a history of hypertension, hyperlipidemia, and palpitations.  She was found to have a high burden of PVCs.  She was put on diltiazem, but did not tolerate the medication.  She continues to have palpitations, but minimal dyspnea or dizziness.  She is back on her atenolol after wearing her monitor.  Her PVC burden has gone down since starting the atenolol.  She does get mildly short of breath when she exerts herself, and she feels that her PVCs may contribute to this.  She was switched to mexiletine at her last visit as her QRS widened on the flecainide.  Today, denies symptoms of palpitations, chest pain, shortness of breath, orthopnea, PND, lower extremity edema, claudication, dizziness, presyncope, syncope, bleeding, or neurologic sequela. The patient is tolerating medications without difficulties.  She continues to have episodic PVCs.  She notices her PVCs when she feels her pulse.  She also has weakness and fatigue that she attributes to her PVCs.  Unfortunately she has not tolerated antiarrhythmic medications.   Past Medical History:  Diagnosis Date  . Anxiety   . Bell's palsy 2009  . Cancer ([place]) Feb.2011   breast,lumpectomy right side followed by radiation  . Depression   . GERD (gastroesophageal reflux disease)   . Hyperlipidemia   . Hypertension   . Palpitations    Frequent PVCs with some bigeminy noted on 48-hour monitor. No arrhythmias.  . Personal history of radiation therapy    Past Surgical History:  Procedure Laterality  Date  . BREAST LUMPECTOMY Right Feb. 2011   right breast,followed by radiation therapy  . CARDIAC EVENT MONITOR     Predominantly sinus rhythm with first-degree block. Minimum heart rate 63 bpm. Maximum heart rate 139 bpm. Average is 85 bpm. 17.5% PVCs.  V bigem & Trigem noted.    Marland Kitchen EYE SURGERY  1953   skin removal in eye  . GXT  04/2017   Exercise tolerance test off of beta-blockers: Normal blood pressure response to exercise.  No EKG changes to suggest ischemic changes.  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  Seen at rest, exercise and recovery.  Marland Kitchen NM MYOVIEW LTD  08/2018   LOW RISK. No ischemia or infarction  . TRANSTHORACIC ECHOCARDIOGRAM  08/2018   EF 55-60%.  No RWMA. Gr 2 DD. Mod AI.  Marland Kitchen TUBAL LIGATION  1978     Current Outpatient Medications  Medication Sig Dispense Refill  . acetaminophen (TYLENOL) 500 MG tablet Take 1,000 mg by mouth every 6 (six) hours as needed. For cramping.    Marland Kitchen aspirin 81 MG tablet Take 81 mg by mouth daily as needed (blood thinner). Does not take on a daily basis    . atenolol (TENORMIN) 50 MG tablet Take 1 tablet (50 mg total) by mouth 2 (two) times daily. 180 tablet 3  . atorvastatin (LIPITOR) 20 MG tablet Take 1 tablet (20 mg total) by mouth daily. 90 tablet 3  . carboxymethylcellulose (REFRESH TEARS) 0.5 % SOLN Place 1 drop into both eyes 3 (  three) times daily as needed (dry eye).     . Cholecalciferol (VITAMIN D3) 50 MCG (2000 UT) TABS Take 1 tablet by mouth daily.    Marland Kitchen dimenhyDRINATE (DRAMAMINE) 50 MG tablet Take 50 mg by mouth every 8 (eight) hours as needed. Reported on 04/19/2016    . famotidine (PEPCID) 10 MG tablet Take 10 mg by mouth at bedtime.    Marland Kitchen LORazepam (ATIVAN) 1 MG tablet TAKE 1 TABLET BY MOUTH EVERYDAY AT BEDTIME 30 tablet 0  . phenazopyridine (PYRIDIUM) 100 MG tablet Take 1 tablet (100 mg total) by mouth 3 (three) times daily as needed for pain. 10 tablet 0  . ramipril (ALTACE) 10 MG capsule Take 2 capsules (20 mg total)  by mouth daily. 180 capsule 2  . vitamin B-12 (CYANOCOBALAMIN) 100 MCG tablet Take 100 mcg by mouth daily.     No current facility-administered medications for this visit.     Allergies:   Ambien  [zolpidem tartrate], Amlodipine, Crestor  [rosuvastatin calcium], Crestor [rosuvastatin calcium], Erythromycin, Macrobid [nitrofurantoin monohyd macro], Macrodantin  [nitrofurantoin macrocrystal], Niacin and related, Restoril, and Zolpidem tartrate   Social History:  The patient  reports that she has never smoked. She has never used smokeless tobacco. She reports that she does not drink alcohol or use drugs.   Family History:  The patient's family history includes Breast cancer (age of onset: 87) in her paternal aunt; Heart attack in her father; Heart disease in her father; Hypertension in her brother and sister; Pancreatic cancer in her father; Stroke in her maternal aunt.   ROS:  Please see the history of present illness.   Otherwise, review of systems is positive for none.   All other systems are reviewed and negative.   PHYSICAL EXAM: VS:  BP 140/82   Pulse 79   Ht 5\' 5"  (1.651 m)   Wt 152 lb 12.8 oz (69.3 kg)   SpO2 96%   BMI 25.43 kg/m  , BMI Body mass index is 25.43 kg/m. GEN: Well nourished, well developed, in no acute distress  HEENT: normal  Neck: no JVD, carotid bruits, or masses Cardiac: RRR; no murmurs, rubs, or gallops,no edema  Respiratory:  clear to auscultation bilaterally, normal work of breathing GI: soft, nontender, nondistended, + BS MS: no deformity or atrophy  Skin: warm and dry Neuro:  Strength and sensation are intact Psych: euthymic mood, full affect  EKG:  EKG is ordered today. Personal review of the ekg ordered shows sinus rhythm, ventricular bigeminy  Recent Labs: 05/26/2019: ALT 17; TSH 6.41 06/09/2019: BUN 13; Creatinine, Ser 0.81; Hemoglobin 13.7; Platelets 196; Potassium 3.7; Sodium 138    Lipid Panel     Component Value Date/Time   CHOL 201 (H)  05/26/2019 0735   TRIG 145.0 05/26/2019 0735   HDL 41.40 05/26/2019 0735   CHOLHDL 5 05/26/2019 0735   VLDL 29.0 05/26/2019 0735   LDLCALC 130 (H) 05/26/2019 0735     Wt Readings from Last 3 Encounters:  07/09/19 152 lb 12.8 oz (69.3 kg)  06/24/19 151 lb 9.6 oz (68.8 kg)  06/09/19 150 lb (68 kg)      Other studies Reviewed: Additional studies/ records that were reviewed today include: TTE 09/02/18  Review of the above records today demonstrates:   - Left ventricle: The cavity size was normal. Systolic function was   normal. The estimated ejection fraction was in the range of 55%   to 60%. Wall motion was normal; there were no regional wall  motion abnormalities. Features are consistent with a pseudonormal   left ventricular filling pattern, with concomitant abnormal   relaxation and increased filling pressure (grade 2 diastolic   dysfunction). - Aortic valve: There was moderate regurgitation. Regurgitation   pressure half-time: 422 ms. - Mitral valve: There was trivial regurgitation. - Right ventricle: Systolic function was normal. - Atrial septum: No defect or patent foramen ovale was identified. - Tricuspid valve: There was trivial regurgitation. - Pulmonic valve: There was no significant regurgitation.  SPECT 09/11/18  There was no ST segment deviation noted during stress.  The study is normal.  This is a low risk study with no evidence of perfusion abnormalities.  This study was not gated due to frequent ventricular ectopy.  Monitor 08/26/18 - personally reviewed  Predominantly sinus rhythm with first-degree block. Minimum heart rate 63 bpm. Maximum heart rate 139 bpm. Average is 85 bpm.  Very rare PACs noted. (<1%)  Frequent isolated PVCs (17.5%);  Ventricular trigeminy and bigeminy noted on diary.  No ventricular tachycardia or other arrhythmias. No pauses or bradycardia.   Significant amount of ventricular ectopy noted, but no arrhythmia.  ASSESSMENT  AND PLAN:  1.  PVCs: PVCs appear to be RVOT in nature.  Is tried flecainide and mexiletine but had side effects on both of these medications.  Having a high burden and thus would prefer ablation.  Risks and benefits were discussed which showed bleeding, tamponade, heart block, stroke.  She understands the risks and is agreed to the procedure.   2.  Hypertension: Blood pressure is elevated today.  Has been better controlled in the past.  Lisa reassess after ablation.   Current medicines are reviewed at length with the patient today.   The patient does not have concerns regarding her medicines.  The following changes were made today: None  Labs/ tests ordered today include:  Orders Placed This Encounter  Procedures  . Basic metabolic panel  . CBC  . EKG 12-Lead    Disposition:   FU with Lisa Camnitz 3 months  Signed, Lisa Meredith Leeds, MD  07/09/2019 10:50 AM     [facility] [address] [place] [address] [phone] (office) [phone] (fax)

## 2019-07-22 ENCOUNTER — Other Ambulatory Visit: Payer: Self-pay

## 2019-07-22 ENCOUNTER — Other Ambulatory Visit: Payer: PPO | Admitting: *Deleted

## 2019-07-22 DIAGNOSIS — I493 Ventricular premature depolarization: Secondary | ICD-10-CM | POA: Diagnosis not present

## 2019-07-22 DIAGNOSIS — Z01812 Encounter for preprocedural laboratory examination: Secondary | ICD-10-CM | POA: Diagnosis not present

## 2019-07-22 LAB — CBC
Hematocrit: 40.2 % (ref 34.0–46.6)
Hemoglobin: 13.4 g/dL (ref 11.1–15.9)
MCH: 31 pg (ref 26.6–33.0)
MCHC: 33.3 g/dL (ref 31.5–35.7)
MCV: 93 fL (ref 79–97)
Platelets: 185 10*3/uL (ref 150–450)
RBC: 4.32 x10E6/uL (ref 3.77–5.28)
RDW: 13.2 % (ref 11.7–15.4)
WBC: 8.8 10*3/uL (ref 3.4–10.8)

## 2019-07-22 LAB — BASIC METABOLIC PANEL
BUN/Creatinine Ratio: 13 (ref 12–28)
BUN: 13 mg/dL (ref 8–27)
CO2: 27 mmol/L (ref 20–29)
Calcium: 9.4 mg/dL (ref 8.7–10.3)
Chloride: 99 mmol/L (ref 96–106)
Creatinine, Ser: 1.01 mg/dL — ABNORMAL HIGH (ref 0.57–1.00)
GFR calc Af Amer: 63 mL/min/{1.73_m2} (ref >59)
GFR calc non Af Amer: 55 mL/min/{1.73_m2} — ABNORMAL LOW (ref >59)
Glucose: 68 mg/dL (ref 65–99)
Potassium: 4.3 mmol/L (ref 3.5–5.2)
Sodium: 135 mmol/L (ref 134–144)

## 2019-07-30 ENCOUNTER — Other Ambulatory Visit: Payer: Self-pay | Admitting: Family

## 2019-07-31 NOTE — Telephone Encounter (Signed)
Control database checked last refill: 06/09/2019 30 tabs LOV: 05/25/2019 [id number]

## 2019-08-04 ENCOUNTER — Telehealth: Payer: Self-pay | Admitting: Cardiology

## 2019-08-04 NOTE — Telephone Encounter (Signed)
° ° °  Patient has questions about 10/28 ablation

## 2019-08-04 NOTE — Telephone Encounter (Signed)
Pt aware I will call her tomorrow to rv instructions

## 2019-08-05 NOTE — Telephone Encounter (Signed)
Reviewed instructions with pt Patient verbalized understanding and agreeable to plan.

## 2019-08-15 ENCOUNTER — Other Ambulatory Visit (HOSPITAL_COMMUNITY)
Admission: RE | Admit: 2019-08-15 | Discharge: 2019-08-15 | Disposition: A | Discharge status: Home / Self Care | Payer: PPO | Source: Ambulatory Visit | Attending: Cardiology | Admitting: Cardiology

## 2019-08-15 DIAGNOSIS — Z01812 Encounter for preprocedural laboratory examination: Secondary | ICD-10-CM | POA: Insufficient documentation

## 2019-08-15 DIAGNOSIS — Z20828 Contact with and (suspected) exposure to other viral communicable diseases: Secondary | ICD-10-CM | POA: Diagnosis not present

## 2019-08-16 LAB — NOVEL CORONAVIRUS, NAA (HOSP ORDER, SEND-OUT TO REF LAB; TAT 18-24 HRS): SARS-CoV-2, NAA: NOT DETECTED

## 2019-08-17 ENCOUNTER — Other Ambulatory Visit: Payer: Self-pay

## 2019-08-17 ENCOUNTER — Telehealth (INDEPENDENT_AMBULATORY_CARE_PROVIDER_SITE_OTHER): Payer: PPO | Admitting: Cardiology

## 2019-08-17 ENCOUNTER — Encounter: Payer: Self-pay | Admitting: Cardiology

## 2019-08-17 DIAGNOSIS — R008 Other abnormalities of heart beat: Secondary | ICD-10-CM

## 2019-08-17 NOTE — Progress Notes (Signed)
Electrophysiology TeleHealth Note   Due to national recommendations of social distancing due to COVID 19, an audio/video telehealth visit is felt to be most appropriate for this patient at this time.  See Epic message for the patient's consent to telehealth for [facility].   Date:  08/17/2019   ID:  Lisa Cox, DOB 02-26-1946, MRN [id number]  Location: patient's home  Provider location: [address], [place]  Evaluation Performed: Follow-up visit  PCP:  Lisa Koch, MD  Cardiologist:  Lisa Hew, MD  Electrophysiologist:  Dr Lisa Cox  Chief Complaint:  PVC  History of Present Illness:    Lisa Cox is a 73 y.o. female who presents via audio/video conferencing for a telehealth visit today.  Since last being seen in our clinic, the patient reports doing very well.  Today, she denies symptoms of palpitations, chest pain, shortness of breath,  lower extremity edema, dizziness, presyncope, or syncope.  The patient is otherwise without complaint today.  The patient denies symptoms of fevers, chills, cough, or new SOB worrisome for COVID 19.  She has a history of PVCs.  She is planned for PVC ablation 08/19/2019.  Today, denies symptoms of palpitations, chest pain, shortness of breath, orthopnea, PND, lower extremity edema, claudication, dizziness, presyncope, syncope, bleeding, or neurologic sequela. The patient is tolerating medications without difficulties.    Past Medical History:  Diagnosis Date  . Anxiety   . Bell's palsy 2009  . Cancer ([place]) Feb.2011   breast,lumpectomy right side followed by radiation  . Depression   . GERD (gastroesophageal reflux disease)   . Hyperlipidemia   . Hypertension   . Palpitations    Frequent PVCs with some bigeminy noted on 48-hour monitor. No arrhythmias.  . Personal history of radiation therapy     Past Surgical History:  Procedure Laterality Date  . BREAST LUMPECTOMY Right Feb. 2011   right  breast,followed by radiation therapy  . CARDIAC EVENT MONITOR     Predominantly sinus rhythm with first-degree block. Minimum heart rate 63 bpm. Maximum heart rate 139 bpm. Average is 85 bpm. 17.5% PVCs.  V bigem & Trigem noted.    Marland Kitchen EYE SURGERY  1953   skin removal in eye  . GXT  04/2017   Exercise tolerance test off of beta-blockers: Normal blood pressure response to exercise.  No EKG changes to suggest ischemic changes.  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  Seen at rest, exercise and recovery.  Marland Kitchen NM MYOVIEW LTD  08/2018   LOW RISK. No ischemia or infarction  . TRANSTHORACIC ECHOCARDIOGRAM  08/2018   EF 55-60%.  No RWMA. Gr 2 DD. Mod AI.  Marland Kitchen TUBAL LIGATION  1978    Current Outpatient Medications  Medication Sig Dispense Refill  . aspirin 81 MG tablet Take 81 mg by mouth every evening.     Marland Kitchen atenolol (TENORMIN) 50 MG tablet Take 1 tablet (50 mg total) by mouth 2 (two) times daily. 180 tablet 3  . atorvastatin (LIPITOR) 20 MG tablet Take 1 tablet (20 mg total) by mouth daily. 90 tablet 3  . carboxymethylcellulose (REFRESH TEARS) 0.5 % SOLN Place 1 drop into both eyes 3 (three) times daily as needed (dry eye).     . Cholecalciferol (VITAMIN D3) 50 MCG (2000 UT) TABS Take 2,000 Units by mouth daily.     Marland Kitchen dimenhyDRINATE (DRAMAMINE) 50 MG tablet Take 50 mg by mouth at bedtime. Must take with Lorazepam    .  famotidine (PEPCID) 10 MG tablet Take 10 mg by mouth at bedtime as needed for heartburn or indigestion.     Marland Kitchen ibuprofen (ADVIL) 200 MG tablet Take 600 mg by mouth every 6 (six) hours as needed for headache.    Marland Kitchen LORazepam (ATIVAN) 1 MG tablet TAKE 1 TABLET BY MOUTH EVERYDAY AT BEDTIME (Patient taking differently: Take 1 mg by mouth at bedtime. ) 30 tablet 0  . Multiple Vitamins-Minerals (PRESERVISION AREDS 2) CAPS Take 1 capsule by mouth 2 (two) times daily.    . phenazopyridine (PYRIDIUM) 100 MG tablet Take 1 tablet (100 mg total) by mouth 3 (three) times daily as needed for  pain. 10 tablet 0  . ramipril (ALTACE) 10 MG capsule Take 2 capsules (20 mg total) by mouth daily. (Patient taking differently: Take 10 mg by mouth 2 (two) times daily. ) 180 capsule 2  . sodium chloride (OCEAN) 0.65 % SOLN nasal spray Place 1 spray into both nostrils as needed for congestion.     No current facility-administered medications for this visit.     Allergies:   Ambien [zolpidem tartrate], Amlodipine, Crestor  [rosuvastatin calcium], Crestor [rosuvastatin calcium], Erythromycin, Macrobid [nitrofurantoin monohyd macro], Niacin and related, Restoril, and Zolpidem tartrate   Social History:  The patient  reports that she has never smoked. She has never used smokeless tobacco. She reports that she does not drink alcohol or use drugs.   Family History:  The patient's  family history includes Breast cancer (age of onset: 40) in her paternal aunt; Heart attack in her father; Heart disease in her father; Hypertension in her brother and sister; Pancreatic cancer in her father; Stroke in her maternal aunt.   ROS:  Please see the history of present illness.   All other systems are personally reviewed and negative.    Exam:    Vital Signs:  BP (!) 150/60   Pulse (!) 35   no acute distress, no shortness of breath.  Labs/Other Tests and Data Reviewed:    Recent Labs: 05/26/2019: ALT 17; TSH 6.41 07/22/2019: BUN 13; Creatinine, Ser 1.01; Hemoglobin 13.4; Platelets 185; Potassium 4.3; Sodium 135   Wt Readings from Last 3 Encounters:  07/09/19 152 lb 12.8 oz (69.3 kg)  06/24/19 151 lb 9.6 oz (68.8 kg)  06/09/19 150 lb (68 kg)     Other studies personally reviewed: Additional studies/ records that were reviewed today include: ECG 07/09/2019 personally reviewed Review of the above records today demonstrates: Sinus rhythm with ventricular bigeminy  ASSESSMENT & PLAN:    1.  PVCs: Plan for ablation 08/19/2019.'s and benefits discussed.  Risks include bleeding, tamponade, heart block,  stroke, damage surrounding organs..  She understands these risks and is agreed to the procedure.  2.  Hypertension:  COVID 19 screen The patient denies symptoms of COVID 19 at this time.  The importance of social distancing was discussed today.  Follow-up:  3 months  Current medicines are reviewed at length with the patient today.   The patient does not have concerns regarding her medicines.  The following changes were made today:  none  Labs/ tests ordered today include:  No orders of the defined types were placed in this encounter.    Patient Risk:  after full review of this patients clinical status, I feel that they are at moderate risk at this time.  Today, I have spent 8 minutes with the patient with telehealth technology discussing PVC .    Signed, Sylvan Sookdeo Meredith Leeds, MD  08/17/2019 4:19 PM     [place] [address] [phone] (office) [phone] (fax)

## 2019-08-18 NOTE — Anesthesia Preprocedure Evaluation (Addendum)
Anesthesia Evaluation  Patient identified by MRN, date of birth, ID band Patient awake    Reviewed: Allergy & Precautions, NPO status , Patient's Chart, lab work & pertinent test results, reviewed documented beta blocker date and time   History of Anesthesia Complications Negative for: history of anesthetic complications  Airway Mallampati: II  TM Distance: >3 FB Neck ROM: Full    Dental no notable dental hx.    Pulmonary neg pulmonary ROS,    Pulmonary exam normal        Cardiovascular hypertension, Pt. on medications and Pt. on home beta blockers Normal cardiovascular exam  TTE 08/2018: EF 37-85%, grade 2 diastolic dysfunction, moderate AR  Frequent PVCs   Neuro/Psych Anxiety Depression negative neurological ROS  negative psych ROS   GI/Hepatic Neg liver ROS, GERD  Medicated and Controlled,  Endo/Other  negative endocrine ROS  Renal/GU negative Renal ROS  negative genitourinary   Musculoskeletal negative musculoskeletal ROS (+)   Abdominal   Peds  Hematology negative hematology ROS (+)   Anesthesia Other Findings Right breast ca s/p lumpectomy and radiation  Reproductive/Obstetrics negative OB ROS                            Anesthesia Physical Anesthesia Plan  ASA: II  Anesthesia Plan: MAC   Post-op Pain Management:    Induction: Intravenous  PONV Risk Score and Plan: 2 and Treatment may vary due to age or medical condition, Ondansetron and Dexamethasone  Airway Management Planned: Natural Airway and Simple Face Mask  Additional Equipment: None  Intra-op Plan:   Post-operative Plan:   Informed Consent: I have reviewed the patients History and Physical, chart, labs and discussed the procedure including the risks, benefits and alternatives for the proposed anesthesia with the patient or authorized representative who has indicated his/her understanding and acceptance.      Dental advisory given  Plan Discussed with: CRNA  Anesthesia Plan Comments:       Anesthesia Quick Evaluation

## 2019-08-19 ENCOUNTER — Ambulatory Visit (HOSPITAL_COMMUNITY): Payer: PPO | Admitting: Anesthesiology

## 2019-08-19 ENCOUNTER — Encounter (HOSPITAL_COMMUNITY): Payer: Self-pay | Admitting: Certified Registered"

## 2019-08-19 ENCOUNTER — Ambulatory Visit (HOSPITAL_COMMUNITY)
Admission: RE | Admit: 2019-08-19 | Discharge: 2019-08-19 | Disposition: A | Discharge status: Home / Self Care | Payer: PPO | Attending: Cardiology | Admitting: Cardiology

## 2019-08-19 ENCOUNTER — Encounter (HOSPITAL_COMMUNITY)
Admission: RE | Disposition: A | Discharge status: Home / Self Care | Payer: Self-pay | Source: Home / Self Care | Attending: Cardiology

## 2019-08-19 ENCOUNTER — Other Ambulatory Visit: Payer: Self-pay

## 2019-08-19 DIAGNOSIS — K219 Gastro-esophageal reflux disease without esophagitis: Secondary | ICD-10-CM | POA: Insufficient documentation

## 2019-08-19 DIAGNOSIS — Z8249 Family history of ischemic heart disease and other diseases of the circulatory system: Secondary | ICD-10-CM | POA: Insufficient documentation

## 2019-08-19 DIAGNOSIS — G4700 Insomnia, unspecified: Secondary | ICD-10-CM | POA: Diagnosis not present

## 2019-08-19 DIAGNOSIS — Z881 Allergy status to other antibiotic agents status: Secondary | ICD-10-CM | POA: Insufficient documentation

## 2019-08-19 DIAGNOSIS — I493 Ventricular premature depolarization: Secondary | ICD-10-CM

## 2019-08-19 DIAGNOSIS — Z888 Allergy status to other drugs, medicaments and biological substances status: Secondary | ICD-10-CM | POA: Diagnosis not present

## 2019-08-19 DIAGNOSIS — E785 Hyperlipidemia, unspecified: Secondary | ICD-10-CM | POA: Diagnosis not present

## 2019-08-19 DIAGNOSIS — I10 Essential (primary) hypertension: Secondary | ICD-10-CM | POA: Insufficient documentation

## 2019-08-19 DIAGNOSIS — E7800 Pure hypercholesterolemia, unspecified: Secondary | ICD-10-CM | POA: Diagnosis not present

## 2019-08-19 DIAGNOSIS — G510 Bell's palsy: Secondary | ICD-10-CM | POA: Diagnosis not present

## 2019-08-19 HISTORY — PX: PVC ABLATION: EP1236

## 2019-08-19 SURGERY — PVC ABLATION
Anesthesia: General

## 2019-08-19 MED ORDER — HEPARIN (PORCINE) IN NACL 1000-0.9 UT/500ML-% IV SOLN
INTRAVENOUS | Status: DC | PRN
  Administered 2019-08-19 (×2): 500 mL

## 2019-08-19 MED ORDER — PROPOFOL 10 MG/ML IV BOLUS
INTRAVENOUS | Status: DC | PRN
  Administered 2019-08-19: 30 mg via INTRAVENOUS
  Administered 2019-08-19: 25 mg via INTRAVENOUS

## 2019-08-19 MED ORDER — SODIUM CHLORIDE 0.9 % IV SOLN
INTRAVENOUS | Status: DC
  Administered 2019-08-19: 07:00:00 via INTRAVENOUS

## 2019-08-19 MED ORDER — BUPIVACAINE HCL (PF) 0.25 % IJ SOLN
INTRAMUSCULAR | Status: AC
  Filled 2019-08-19: qty 30

## 2019-08-19 MED ORDER — ACETAMINOPHEN 325 MG PO TABS: 650.0000 mg | ORAL_TABLET | ORAL | Status: DC | PRN

## 2019-08-19 MED ORDER — PROPOFOL 500 MG/50ML IV EMUL
INTRAVENOUS | Status: DC | PRN
  Administered 2019-08-19: 100 ug/kg/min via INTRAVENOUS

## 2019-08-19 MED ORDER — ONDANSETRON HCL 4 MG/2ML IJ SOLN: 4.0000 mg | Freq: Four times a day (QID) | INTRAMUSCULAR | Status: DC | PRN

## 2019-08-19 MED ORDER — SODIUM CHLORIDE 0.9 % IV SOLN: 250.0000 mL | INTRAVENOUS | Status: DC | PRN

## 2019-08-19 MED ORDER — ACETAMINOPHEN 10 MG/ML IV SOLN: 1000.0000 mg | Freq: Once | INTRAVENOUS | Status: DC | PRN

## 2019-08-19 MED ORDER — BUPIVACAINE HCL (PF) 0.25 % IJ SOLN
INTRAMUSCULAR | Status: DC | PRN
  Administered 2019-08-19: 30 mL

## 2019-08-19 MED ORDER — HEPARIN SODIUM (PORCINE) 1000 UNIT/ML IJ SOLN
INTRAMUSCULAR | Status: AC
  Filled 2019-08-19: qty 1

## 2019-08-19 MED ORDER — SODIUM CHLORIDE 0.9% FLUSH: 3.0000 mL | INTRAVENOUS | Status: DC | PRN

## 2019-08-19 MED ORDER — FENTANYL CITRATE (PF) 100 MCG/2ML IJ SOLN: 25.0000 ug | INTRAMUSCULAR | Status: DC | PRN

## 2019-08-19 MED ORDER — FENTANYL CITRATE (PF) 100 MCG/2ML IJ SOLN
INTRAMUSCULAR | Status: DC | PRN
  Administered 2019-08-19 (×2): 25 ug via INTRAVENOUS

## 2019-08-19 MED ORDER — OXYCODONE HCL 5 MG PO TABS: 5.0000 mg | ORAL_TABLET | Freq: Once | ORAL | Status: DC | PRN

## 2019-08-19 MED ORDER — ONDANSETRON HCL 4 MG/2ML IJ SOLN
INTRAMUSCULAR | Status: DC | PRN
  Administered 2019-08-19: 4 mg via INTRAVENOUS

## 2019-08-19 MED ORDER — HEPARIN SODIUM (PORCINE) 1000 UNIT/ML IJ SOLN
INTRAMUSCULAR | Status: DC | PRN
  Administered 2019-08-19: 1000 [IU] via INTRAVENOUS

## 2019-08-19 MED ORDER — PROMETHAZINE HCL 25 MG/ML IJ SOLN: 6.2500 mg | INTRAMUSCULAR | Status: DC | PRN

## 2019-08-19 MED ORDER — OXYCODONE HCL 5 MG/5ML PO SOLN
5.0000 mg | Freq: Once | ORAL | Status: DC | PRN
  Filled 2019-08-19: qty 5

## 2019-08-19 SURGICAL SUPPLY — 16 items
BAG SNAP BAND KOVER 36X36 (MISCELLANEOUS) ×3 IMPLANT
BLANKET WARM UNDERBOD FULL ACC (MISCELLANEOUS) ×2 IMPLANT
CATH JOSEPH QUAD ALLRED 6F REP (CATHETERS) ×2 IMPLANT
CATH SMTCH THERMOCOOL SF DF (CATHETERS) ×2 IMPLANT
CATH SOUNDSTAR 3D IMAGING (CATHETERS) ×2 IMPLANT
DEVICE CLOSURE PERCLS PRGLD 6F (VASCULAR PRODUCTS) IMPLANT
PACK EP LATEX FREE (CUSTOM PROCEDURE TRAY) ×3
PACK EP LF (CUSTOM PROCEDURE TRAY) ×1 IMPLANT
PAD PRO RADIOLUCENT 2001M-C (PAD) ×3 IMPLANT
PATCH CARTO3 (PAD) ×2 IMPLANT
PERCLOSE PROGLIDE 6F (VASCULAR PRODUCTS) ×12
SHEATH AVANTI 11F 11CM (SHEATH) ×2 IMPLANT
SHEATH PINNACLE 6F 10CM (SHEATH) ×3 IMPLANT
SHEATH PINNACLE 8F 10CM (SHEATH) ×2 IMPLANT
SHEATH PROBE COVER 6X72 (BAG) ×2 IMPLANT
TUBING SMART ABLATE COOLFLOW (TUBING) ×2 IMPLANT

## 2019-08-19 NOTE — Anesthesia Postprocedure Evaluation (Signed)
Anesthesia Post Note  Patient: Lisa Cox  Procedure(s) Performed: PVC ABLATION (N/A )     Patient location during evaluation: PACU Anesthesia Type: MAC Level of consciousness: awake and alert and oriented Pain management: pain level controlled Vital Signs Assessment: post-procedure vital signs reviewed and stable Respiratory status: spontaneous breathing, nonlabored ventilation and respiratory function stable Cardiovascular status: blood pressure returned to baseline Postop Assessment: no apparent nausea or vomiting Anesthetic complications: no    Last Vitals:  Vitals:   08/19/19 1112 08/19/19 1115  BP:  (!) 144/45  Pulse:  (!) 57  Resp:  (!) 21  Temp: (!) 36.3 C   SpO2:  100%    Last Pain:  Vitals:   08/19/19 1112  TempSrc: Temporal  PainSc: 0-No pain                 Brennan Bailey

## 2019-08-19 NOTE — Transfer of Care (Signed)
Immediate Anesthesia Transfer of Care Note  Patient: [name]  Procedure(s) Performed: PVC ABLATION (N/A )  Patient Location: Cath Lab  Anesthesia Type:MAC  Level of Consciousness: awake, alert  and oriented  Airway & Oxygen Therapy: Patient Spontanous Breathing and Patient connected to nasal cannula oxygen  Post-op Assessment: Report given to RN and Post -op Vital signs reviewed and stable  Post vital signs: Reviewed and stable  Last Vitals:  Vitals Value Taken Time  BP 142/50 08/19/19 1111  Temp 36.3 C 08/19/19 1112  Pulse 58 08/19/19 1113  Resp 16 08/19/19 1113  SpO2 100 % 08/19/19 1113  Vitals shown include unvalidated device data.  Last Pain:  Vitals:   08/19/19 1112  TempSrc: Temporal  PainSc: 0-No pain         Complications: No apparent anesthesia complications

## 2019-08-19 NOTE — H&P (Signed)
Lisa Cox has presented today for surgery, with the diagnosis of PVCs3.  The various methods of treatment have been discussed with the patient and family. After consideration of risks, benefits and other options for treatment, the patient has consented to  Procedure(s): Catheter ablation as a surgical intervention .  Risks include but not limited to bleeding, tamponade, heart block, stroke, damage to surrounding organs, among others. The patient's history has been reviewed, patient examined, no change in status, stable for surgery.  I have reviewed the patient's chart and labs.  Questions were answered to the patient's satisfaction.    Maron Stanzione Curt Bears, MD 08/19/2019 7:52 AM      Electrophysiology TeleHealth Note   Due to national recommendations of social distancing due to [place] 19, an audio/video telehealth visit is felt to be most appropriate for this patient at this time.  See Epic message for the patient's consent to telehealth for [facility].   Date:  08/19/2019   ID:  Lisa Cox, DOB Oct 28, 1945, MRN ID:[id number]  Location: patient's home  Provider location: [address] [place]  Evaluation Performed: Follow-up visit  PCP:  Hoyt Koch, MD  Cardiologist:  Glenetta Hew, MD  Electrophysiologist:  Dr Curt Bears  Chief Complaint:  PVC  History of Present Illness:    Lisa Cox is a 73 y.o. female who presents via audio/video conferencing for a telehealth visit today.  Since last being seen in our clinic, the patient reports doing very well.  Today, she denies symptoms of palpitations, chest pain, shortness of breath,  lower extremity edema, dizziness, presyncope, or syncope.  The patient is otherwise without complaint today.  The patient denies symptoms of fevers, chills, cough, or new SOB worrisome for COVID 19.  She has a history of PVCs.  She is planned for PVC ablation 08/19/2019.  Today, denies symptoms of palpitations, chest pain,  shortness of breath, orthopnea, PND, lower extremity edema, claudication, dizziness, presyncope, syncope, bleeding, or neurologic sequela. The patient is tolerating medications without difficulties.    Past Medical History:  Diagnosis Date  . Anxiety   . Bell's palsy 2009  . Cancer ([place]) Feb.2011   breast,lumpectomy right side followed by radiation  . Depression   . GERD (gastroesophageal reflux disease)   . Hyperlipidemia   . Hypertension   . Palpitations    Frequent PVCs with some bigeminy noted on 48-hour monitor. No arrhythmias.  . Personal history of radiation therapy     Past Surgical History:  Procedure Laterality Date  . BREAST LUMPECTOMY Right Feb. 2011   right breast,followed by radiation therapy  . CARDIAC EVENT MONITOR     Predominantly sinus rhythm with first-degree block. Minimum heart rate 63 bpm. Maximum heart rate 139 bpm. Average is 85 bpm. 17.5% PVCs.  V bigem & Trigem noted.    Marland Kitchen EYE SURGERY  1953   skin removal in eye  . GXT  04/2017   Exercise tolerance test off of beta-blockers: Normal blood pressure response to exercise.  No EKG changes to suggest ischemic changes.  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  Seen at rest, exercise and recovery.  Marland Kitchen NM MYOVIEW LTD  08/2018   LOW RISK. No ischemia or infarction  . TRANSTHORACIC ECHOCARDIOGRAM  08/2018   EF 55-60%.  No RWMA. Gr 2 DD. Mod AI.  Marland Kitchen TUBAL LIGATION  1978    Current Facility-Administered Medications  Medication Dose Route Frequency Provider Last Rate Last Dose  . 0.9 %  sodium chloride infusion   Intravenous Continuous Constance Haw, MD 50 mL/hr at 08/19/19 0701      Allergies:   Amlodipine, Crestor [rosuvastatin calcium], Erythromycin, Macrobid [nitrofurantoin monohyd macro], Niacin and related, Restoril, and Zolpidem tartrate   Social History:  The patient  reports that she has never smoked. She has never used smokeless tobacco. She reports that she does not drink alcohol or use  drugs.   Family History:  The patient's  family history includes Breast cancer (age of onset: 68) in her paternal aunt; Heart attack in her father; Heart disease in her father; Hypertension in her brother and sister; Pancreatic cancer in her father; Stroke in her maternal aunt.   ROS:  Please see the history of present illness.   All other systems are personally reviewed and negative.    Exam:    Vital Signs:  BP (!) 209/77   Pulse 71   Temp 98.7 F (37.1 C) (Oral)   Resp 14   Ht 5\' 5"  (1.651 m)   Wt 70.3 kg   SpO2 97%   BMI 25.79 kg/m   no acute distress, no shortness of breath.  Labs/Other Tests and Data Reviewed:    Recent Labs: 05/26/2019: ALT 17; TSH 6.41 07/22/2019: BUN 13; Creatinine, Ser 1.01; Hemoglobin 13.4; Platelets 185; Potassium 4.3; Sodium 135   Wt Readings from Last 3 Encounters:  08/19/19 70.3 kg  07/09/19 69.3 kg  06/24/19 68.8 kg     Other studies personally reviewed: Additional studies/ records that were reviewed today include: ECG 07/09/2019 personally reviewed Review of the above records today demonstrates: Sinus rhythm with ventricular bigeminy  ASSESSMENT & PLAN:    1.  PVCs: Plan for ablation 08/19/2019.'s and benefits discussed.  Risks include bleeding, tamponade, heart block, stroke, damage surrounding organs..  She understands these risks and is agreed to the procedure.  2.  Hypertension:  COVID 19 screen The patient denies symptoms of COVID 19 at this time.  The importance of social distancing was discussed today.  Follow-up:  3 months  Current medicines are reviewed at length with the patient today.   The patient does not have concerns regarding her medicines.  The following changes were made today:  none  Labs/ tests ordered today include:  Orders Placed This Encounter  Procedures  . Diet NPO time specified  . Informed Consent Details: Physician/Practitioner Attestation; Transcribe to consent form and obtain patient signature  .  Initiate Pre-op Protocol  . Void on call to EP Lab  . Confirm CBC and BMP (or CMP) results within 7 days for inpatient and 30 days for outpatient:  . Clip right and left femoral area PM before surgery  . Clip right internal jugular area PM before surgery  . Pre-admission testing diagnosis  . EP STUDY  . Insert peripheral IV     Patient Risk:  after full review of this patients clinical status, I feel that they are at moderate risk at this time.  Today, I have spent 8 minutes with the patient with telehealth technology discussing PVC .    Signed, Malijah Lietz Meredith Leeds, MD  08/19/2019 7:52 AM     [facility] [address] [place] [address] [phone] (office) [phone] (fax)

## 2019-08-19 NOTE — Anesthesia Procedure Notes (Signed)
Procedure Name: MAC Date/Time: 08/19/2019 9:13 AM Performed by: Imagene Riches, CRNA Pre-anesthesia Checklist: Patient identified, Emergency Drugs available, Suction available and Patient being monitored Patient Re-evaluated:Patient Re-evaluated prior to induction Oxygen Delivery Method: Simple face mask Ventilation: Oral airway inserted - appropriate to patient size

## 2019-08-19 NOTE — Discharge Instructions (Signed)
Cardiac Ablation, Care After This sheet gives you information about how to care for yourself after your procedure. Your health care provider may also give you more specific instructions. If you have problems or questions, contact your health care provider. What can I expect after the procedure? After the procedure, it is common to have:  Bruising around your puncture site.  Tenderness around your puncture site.  Skipped heartbeats.  Tiredness (fatigue). Follow these instructions at home: Puncture site care   Follow instructions from your health care provider about how to take care of your puncture site. Make sure you: ? Wash your hands with soap and water before you change your bandage (dressing). If soap and water are not available, use hand sanitizer. ? Change your dressing as told by your health care provider. ? Leave stitches (sutures), skin glue, or adhesive strips in place. These skin closures may need to stay in place for up to 2 weeks. If adhesive strip edges start to loosen and curl up, you may trim the loose edges. Do not remove adhesive strips completely unless your health care provider tells you to do that.  Check your puncture site every day for signs of infection. Check for: ? Redness, swelling, or pain. ? Fluid or blood. If your puncture site starts to bleed, lie down on your back, apply firm pressure to the area, and contact your health care provider. ? Warmth. ? Pus or a bad smell. Driving  Ask your health care provider when it is safe for you to drive again after the procedure.  Do not drive or use heavy machinery while taking prescription pain medicine.  Do not drive for 24 hours if you were given a medicine to help you relax (sedative) during your procedure. Activity  Avoid activities that take a lot of effort for at least 3 days after your procedure.  Do not lift anything that is heavier than 10 lb (4.5 kg), or the limit that you are told, until your health  care provider says that it is safe.  Return to your normal activities as told by your health care provider. Ask your health care provider what activities are safe for you. General instructions  Take over-the-counter and prescription medicines only as told by your health care provider.  Do not use any products that contain nicotine or tobacco, such as cigarettes and e-cigarettes. If you need help quitting, ask your health care provider.  Do not take baths, swim, or use a hot tub until your health care provider approves.  Do not drink alcohol for 24 hours after your procedure.  Keep all follow-up visits as told by your health care provider. This is important. Contact a health care provider if:  You have redness, mild swelling, or pain around your puncture site.  You have fluid or blood coming from your puncture site that stops after applying firm pressure to the area.  Your puncture site feels warm to the touch.  You have pus or a bad smell coming from your puncture site.  You have a fever.  You have chest pain or discomfort that spreads to your neck, jaw, or arm.  You are sweating a lot.  You feel nauseous.  You have a fast or irregular heartbeat.  You have shortness of breath.  You are dizzy or light-headed and feel the need to lie down.  You have pain or numbness in the arm or leg closest to your puncture site. Get help right away if:  Your puncture  site suddenly swells. °· Your puncture site is bleeding and the bleeding does not stop after applying firm pressure to the area. °These symptoms may represent a serious problem that is an emergency. Do not wait to see if the symptoms will go away. Get medical help right away. Call your local emergency services (911 in the [place] Do not drive yourself to the hospital. °Summary °· After the procedure, it is normal to have bruising and tenderness at the puncture site in your groin, neck, or forearm. °· Check your puncture site every  day for signs of infection. °· Get help right away if your puncture site is bleeding and the bleeding does not stop after applying firm pressure to the area. This is a medical emergency. °This information is not intended to replace advice given to you by your health care provider. Make sure you discuss any questions you have with your health care provider. °Document Released: 01/17/2017 Document Revised: 09/20/2017 Document Reviewed: 01/17/2017 °Elsevier Patient Education © 2020 [employer] ° °

## 2019-08-20 ENCOUNTER — Encounter (HOSPITAL_COMMUNITY): Payer: Self-pay | Admitting: Cardiology

## 2019-08-31 ENCOUNTER — Other Ambulatory Visit: Payer: Self-pay | Admitting: Internal Medicine

## 2019-08-31 NOTE — Telephone Encounter (Signed)
Control database checked last refill: 07/31/2019 30 tabs  LOV: 05/25/2019 [id number]

## 2019-09-01 ENCOUNTER — Other Ambulatory Visit: Payer: PPO

## 2019-09-01 ENCOUNTER — Ambulatory Visit (INDEPENDENT_AMBULATORY_CARE_PROVIDER_SITE_OTHER): Payer: PPO | Admitting: Family

## 2019-09-01 ENCOUNTER — Other Ambulatory Visit: Payer: Self-pay

## 2019-09-01 ENCOUNTER — Encounter: Payer: Self-pay | Admitting: Family

## 2019-09-01 VITALS — BP 148/78 | HR 66 | Temp 97.8°F | Ht 65.0 in | Wt 155.6 lb

## 2019-09-01 DIAGNOSIS — N390 Urinary tract infection, site not specified: Secondary | ICD-10-CM

## 2019-09-01 LAB — POC URINALSYSI DIPSTICK (AUTOMATED)
Glucose, UA: NEGATIVE
Ketones, UA: POSITIVE
Nitrite, UA: POSITIVE
Protein, UA: POSITIVE — AB
Spec Grav, UA: 1.015 (ref 1.010–1.025)
Urobilinogen, UA: 2 E.U./dL — AB
pH, UA: 6 (ref 5.0–8.0)

## 2019-09-01 MED ORDER — SULFAMETHOXAZOLE-TRIMETHOPRIM 800-160 MG PO TABS: 1.0000 | ORAL_TABLET | Freq: Two times a day (BID) | ORAL | 0 refills | Status: DC

## 2019-09-01 NOTE — Addendum Note (Signed)
Addended by: Marcina Millard on: 09/01/2019 03:30 PM   Modules accepted: Orders

## 2019-09-01 NOTE — Progress Notes (Signed)
Lisa Cox is a 73 y.o. female with the following history as recorded in EpicCare:  Patient Active Problem List   Diagnosis Date Noted  . Fatigue 05/26/2019  . PVC (premature ventricular contraction) 10/31/2018  . Routine general medical examination at a health care facility 07/12/2017  . Essential hypertension 07/12/2017  . Frequent unifocal PVCs 04/02/2017  . Ventricular bigeminy seen on cardiac monitor 05/26/2016  . Insomnia 04/10/2016  . History of breast cancer 09/29/2011  . Hypercholesterolemia 07/11/2011  . Benign hypertensive heart disease without heart failure 07/11/2011  . Palpitations     Current Outpatient Medications  Medication Sig Dispense Refill  . aspirin 81 MG tablet Take 81 mg by mouth every evening.     Marland Kitchen atenolol (TENORMIN) 50 MG tablet Take 1 tablet (50 mg total) by mouth 2 (two) times daily. 180 tablet 3  . carboxymethylcellulose (REFRESH TEARS) 0.5 % SOLN Place 1 drop into both eyes 3 (three) times daily as needed (dry eye).     . Cholecalciferol (VITAMIN D3) 50 MCG (2000 UT) TABS Take 2,000 Units by mouth daily.     Marland Kitchen dimenhyDRINATE (DRAMAMINE) 50 MG tablet Take 50 mg by mouth at bedtime. Must take with Lorazepam    . famotidine (PEPCID) 10 MG tablet Take 10 mg by mouth at bedtime as needed for heartburn or indigestion.     Marland Kitchen ibuprofen (ADVIL) 200 MG tablet Take 600 mg by mouth every 6 (six) hours as needed for headache.    Marland Kitchen LORazepam (ATIVAN) 1 MG tablet TAKE 1 TABLET BY MOUTH EVERYDAY AT BEDTIME 30 tablet 2  . Multiple Vitamins-Minerals (PRESERVISION AREDS 2) CAPS Take 1 capsule by mouth 2 (two) times daily.    . phenazopyridine (PYRIDIUM) 100 MG tablet Take 1 tablet (100 mg total) by mouth 3 (three) times daily as needed for pain. 10 tablet 0  . ramipril (ALTACE) 10 MG capsule Take 2 capsules (20 mg total) by mouth daily. (Patient taking differently: Take 10 mg by mouth 2 (two) times daily. ) 180 capsule 2  . sodium chloride (OCEAN) 0.65 % SOLN nasal  spray Place 1 spray into both nostrils as needed for congestion.    Marland Kitchen atorvastatin (LIPITOR) 20 MG tablet Take 1 tablet (20 mg total) by mouth daily. (Patient not taking: Reported on 09/01/2019) 90 tablet 3  . sulfamethoxazole-trimethoprim (BACTRIM DS) 800-160 MG tablet Take 1 tablet by mouth 2 (two) times daily. 10 tablet 0   No current facility-administered medications for this visit.     Allergies: Amlodipine, Crestor [rosuvastatin calcium], Erythromycin, Macrobid [nitrofurantoin monohyd macro], Niacin and related, Restoril, and Zolpidem tartrate  Past Medical History:  Diagnosis Date  . Anxiety   . Bell's palsy 2009  . Cancer ([place]) Feb.2011   breast,lumpectomy right side followed by radiation  . Depression   . GERD (gastroesophageal reflux disease)   . Hyperlipidemia   . Hypertension   . Palpitations    Frequent PVCs with some bigeminy noted on 48-hour monitor. No arrhythmias.  . Personal history of radiation therapy     Past Surgical History:  Procedure Laterality Date  . BREAST LUMPECTOMY Right Feb. 2011   right breast,followed by radiation therapy  . CARDIAC EVENT MONITOR     Predominantly sinus rhythm with first-degree block. Minimum heart rate 63 bpm. Maximum heart rate 139 bpm. Average is 85 bpm. 17.5% PVCs.  V bigem & Trigem noted.    Marland Kitchen EYE SURGERY  1953   skin removal in eye  . GXT  04/2017   Exercise tolerance test off of beta-blockers: Normal blood pressure response to exercise.  No EKG changes to suggest ischemic changes.  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  Seen at rest, exercise and recovery.  Marland Kitchen NM MYOVIEW LTD  08/2018   LOW RISK. No ischemia or infarction  . PVC ABLATION N/A 08/19/2019   Procedure: PVC ABLATION;  Surgeon: Constance Haw, MD;  Location: [facility];  Service: Cardiovascular;  Laterality: N/A;  . TRANSTHORACIC ECHOCARDIOGRAM  08/2018   EF 55-60%.  No RWMA. Gr 2 DD. Mod AI.  Marland Kitchen TUBAL LIGATION  1978    Family History   Problem Relation Age of Onset  . Heart disease Father   . Heart attack Father   . Pancreatic cancer Father   . Hypertension Sister   . Hypertension Brother   . Stroke Maternal Aunt   . Breast cancer Paternal Aunt 8       x 2    Social History   Tobacco Use  . Smoking status: Never Smoker  . Smokeless tobacco: Never Used  Substance Use Topics  . Alcohol use: No    Subjective:  Presents with concerns for possible UTI; is prone to recurrent UTIs; symptoms x 1-2 days;  Does have antibiotics from her urologist- took Keflex x 3 days last week and felt better but symptoms have started to recur; using OTC Azo;     Objective:  Vitals:   09/01/19 1141  BP: (!) 148/78  Pulse: 66  Temp: 97.8 F (36.6 C)  TempSrc: Oral  SpO2: 97%  Weight: 155 lb 9.6 oz (70.6 kg)  Height: 5\' 5"  (1.651 m)    General: Well developed, well nourished, in no acute distress  Skin : Warm and dry.  Head: Normocephalic and atraumatic  Lungs: Respirations unlabored;  CVS exam: normal rate and regular rhythm.  Neurologic: Alert and oriented; speech intact; face symmetrical; moves all extremities well; CNII-XII intact without focal deficit   Assessment:  1. Urinary tract infection without hematuria, site unspecified     Plan:  Check U/A and urine culture; Rx for Bactrim DS bid x 5 days; increase fluids, rest and follow-up worse, no better.   No follow-ups on file.  Orders Placed This Encounter  Procedures  . Urine Culture    Standing Status:   Future    Standing Expiration Date:   08/31/2020    Requested Prescriptions   Signed Prescriptions Disp Refills  . sulfamethoxazole-trimethoprim (BACTRIM DS) 800-160 MG tablet 10 tablet 0    Sig: Take 1 tablet by mouth 2 (two) times daily.

## 2019-09-03 LAB — URINE CULTURE
MICRO NUMBER:: 1084084
SPECIMEN QUALITY:: ADEQUATE

## 2019-09-11 ENCOUNTER — Other Ambulatory Visit: Payer: Self-pay

## 2019-09-24 ENCOUNTER — Ambulatory Visit: Payer: PPO | Admitting: Cardiology

## 2019-09-24 ENCOUNTER — Encounter: Payer: Self-pay | Admitting: Cardiology

## 2019-09-24 ENCOUNTER — Other Ambulatory Visit: Payer: Self-pay

## 2019-09-24 VITALS — BP 170/86 | HR 70 | Ht 65.0 in | Wt 153.6 lb

## 2019-09-24 DIAGNOSIS — I493 Ventricular premature depolarization: Secondary | ICD-10-CM

## 2019-09-24 MED ORDER — HYDROCHLOROTHIAZIDE 25 MG PO TABS: 25.0000 mg | ORAL_TABLET | Freq: Every day | ORAL | 6 refills | Status: DC

## 2019-09-24 MED ORDER — CARVEDILOL 25 MG PO TABS: 25.0000 mg | ORAL_TABLET | Freq: Two times a day (BID) | ORAL | 6 refills | Status: DC

## 2019-09-24 NOTE — Patient Instructions (Addendum)
Medication Instructions:  Your physician has recommended you make the following change in your medication:  1. STOP Atenolol 2. START Carvedilol (Coreg) 25 mg twice a day 3. START Hydrochlorothiazide (HCTZ) 25 mg once a day   * If you need a refill on your cardiac medications before your next appointment, please call your pharmacy.   Labwork: None ordered  Testing/Procedures: None ordered  Follow-Up: At [facility], you and your health needs are our priority.  As part of our continuing mission to provide you with exceptional heart care, we have created designated Provider Care Teams.  These Care Teams include your primary Cardiologist (physician) and Advanced Practice Providers (APPs -  Physician Assistants and Nurse Practitioners) who all work together to provide you with the care you need, when you need it.  You will need a follow up appointment in 6 months.  Please call our office 2 months in advance to schedule this appointment.  You may see Dr Curt Bears or one of the following Advanced Practice Providers on your designated Care Team:    Chanetta Marshall, NP  Tommye Standard, PA-C  Oda Kilts, [place]  Thank you for choosing [facility]!!   Trinidad Curet, RN [phone]  Any Other Special Instructions Will Be Listed Below (If Applicable).  Carvedilol tablets What is this medicine? CARVEDILOL (KAR ve dil ol) is a beta-blocker. Beta-blockers reduce the workload on the heart and help it to beat more regularly. This medicine is used to treat high blood pressure and heart failure. This medicine may be used for other purposes; ask your health care provider or pharmacist if you have questions. COMMON BRAND NAME(S): Coreg What should I tell my health care provider before I take this medicine? They need to know if you have any of these conditions:  circulation problems  diabetes  history of heart attack or heart disease  liver disease  lung or breathing disease, like asthma  or emphysema  pheochromocytoma  slow or irregular heartbeat  thyroid disease  an unusual or allergic reaction to carvedilol, other beta-blockers, medicines, foods, dyes, or preservatives  pregnant or trying to get pregnant  breast-feeding How should I use this medicine? Take this medicine by mouth with a glass of water. Follow the directions on the prescription label. It is best to take the tablets with food. Take your doses at regular intervals. Do not take your medicine more often than directed. Do not stop taking except on the advice of your doctor or health care professional. Talk to your pediatrician regarding the use of this medicine in children. Special care may be needed. Overdosage: If you think you have taken too much of this medicine contact a poison control center or emergency room at once. NOTE: This medicine is only for you. Do not share this medicine with others. What if I miss a dose? If you miss a dose, take it as soon as you can. If it is almost time for your next dose, take only that dose. Do not take double or extra doses. What may interact with this medicine? This medicine may interact with the following medications:  certain medicines for blood pressure, heart disease, irregular heart beat  certain medicines for depression, like fluoxetine or paroxetine  certain medicines for diabetes, like glipizide or glyburide  cimetidine  clonidine  cyclosporine  digoxin  MAOIs like Carbex, Eldepryl, Marplan, Nardil, and Parnate  reserpine  rifampin This list may not describe all possible interactions. Give your health care provider a list of  all the medicines, herbs, non-prescription drugs, or dietary supplements you use. Also tell them if you smoke, drink alcohol, or use illegal drugs. Some items may interact with your medicine. What should I watch for while using this medicine? Check your heart rate and blood pressure regularly while you are taking this  medicine. Ask your doctor or health care professional what your heart rate and blood pressure should be, and when you should contact him or her. Do not stop taking this medicine suddenly. This could lead to serious heart-related effects. Contact your doctor or health care professional if you have difficulty breathing while taking this drug. Check your weight daily. Ask your doctor or health care professional when you should notify him/her of any weight gain. You may get drowsy or dizzy. Do not drive, use machinery, or do anything that requires mental alertness until you know how this medicine affects you. To reduce the risk of dizzy or fainting spells, do not sit or stand up quickly. Alcohol can make you more drowsy, and increase flushing and rapid heartbeats. Avoid alcoholic drinks. This medicine may increase blood sugar. Ask your healthcare provider if changes in diet or medicines are needed if you have diabetes. If you are going to have surgery, tell your doctor or health care professional that you are taking this medicine. What side effects may I notice from receiving this medicine? Side effects that you should report to your doctor or health care professional as soon as possible:  allergic reactions like skin rash, itching or hives, swelling of the face, lips, or tongue  breathing problems  dark urine  irregular heartbeat   signs and symptoms of high blood sugar such as being more thirsty or hungry or having to urinate more than normal. You may also feel very tired or have blurry vision.  swollen legs or ankles  vomiting  yellowing of the eyes or skin Side effects that usually do not require medical attention (report to your doctor or health care professional if they continue or are bothersome):  change in sex drive or performance  diarrhea  dry eyes (especially if wearing contact lenses)  dry, itching skin  headache  nausea  unusually tired This list may not describe all  possible side effects. Call your doctor for medical advice about side effects. You may report side effects to FDA at 1-800-FDA-1088. Where should I keep my medicine? Keep out of the reach of children. Store at room temperature below 30 degrees C (86 degrees F). Protect from moisture. Keep container tightly closed. Throw away any unused medicine after the expiration date. NOTE: This sheet is a summary. It may not cover all possible information. If you have questions about this medicine, talk to your doctor, pharmacist, or health care provider.  2020 Elsevier/[employer] Standard (2018-07-30 09:13:57)   Hydrochlorothiazide, HCTZ capsules or tablets What is this medicine? HYDROCHLOROTHIAZIDE (hye droe klor oh THYE a zide) is a diuretic. It increases the amount of urine passed, which causes the body to lose salt and water. This medicine is used to treat high blood pressure. It is also reduces the swelling and water retention caused by various medical conditions, such as heart, liver, or kidney disease. This medicine may be used for other purposes; ask your health care provider or pharmacist if you have questions. COMMON BRAND NAME(S): Esidrix, Ezide, HydroDIURIL, Microzide, Oretic, Zide What should I tell my health care provider before I take this medicine? They need to know if you have any of these conditions:  diabetes  gout  immune system problems, like lupus  kidney disease or kidney stones  liver disease  pancreatitis  small amount of urine or difficulty passing urine  an unusual or allergic reaction to hydrochlorothiazide, sulfa drugs, other medicines, foods, dyes, or preservatives  pregnant or trying to get pregnant  breast-feeding How should I use this medicine? Take this medicine by mouth with a glass of water. Follow the directions on the prescription label. Take your medicine at regular intervals. Remember that you will need to pass urine frequently after taking this medicine. Do  not take your doses at a time of day that will cause you problems. Do not stop taking your medicine unless your doctor tells you to. Talk to your pediatrician regarding the use of this medicine in children. Special care may be needed. Overdosage: If you think you have taken too much of this medicine contact a poison control center or emergency room at once. NOTE: This medicine is only for you. Do not share this medicine with others. What if I miss a dose? If you miss a dose, take it as soon as you can. If it is almost time for your next dose, take only that dose. Do not take double or extra doses. What may interact with this medicine?  cholestyramine  colestipol  digoxin  dofetilide  lithium  medicines for blood pressure  medicines for diabetes  medicines that relax muscles for surgery  other diuretics  steroid medicines like prednisone or cortisone This list may not describe all possible interactions. Give your health care provider a list of all the medicines, herbs, non-prescription drugs, or dietary supplements you use. Also tell them if you smoke, drink alcohol, or use illegal drugs. Some items may interact with your medicine. What should I watch for while using this medicine? Visit your doctor or health care professional for regular checks on your progress. Check your blood pressure as directed. Ask your doctor or health care professional what your blood pressure should be and when you should contact him or her. You may need to be on a special diet while taking this medicine. Ask your doctor. Check with your doctor or health care professional if you get an attack of severe diarrhea, nausea and vomiting, or if you sweat a lot. The loss of too much body fluid can make it dangerous for you to take this medicine. You may get drowsy or dizzy. Do not drive, use machinery, or do anything that needs mental alertness until you know how this medicine affects you. Do not stand or sit up  quickly, especially if you are an older patient. This reduces the risk of dizzy or fainting spells. Alcohol may interfere with the effect of this medicine. Avoid alcoholic drinks. This medicine may increase blood sugar. Ask your healthcare provider if changes in diet or medicines are needed if you have diabetes. This medicine can make you more sensitive to the sun. Keep out of the sun. If you cannot avoid being in the sun, wear protective clothing and use sunscreen. Do not use sun lamps or tanning beds/booths. What side effects may I notice from receiving this medicine? Side effects that you should report to your doctor or health care professional as soon as possible:  allergic reactions such as skin rash or itching, hives, swelling of the lips, mouth, tongue, or throat  changes in vision  chest pain  eye pain  fast or irregular heartbeat  feeling faint or lightheaded, falls  gout attack  muscle pain or cramps  pain or difficulty when passing urine  pain, tingling, numbness in the hands or feet  redness, blistering, peeling or loosening of the skin, including inside the mouth   signs and symptoms of high blood sugar such as being more thirsty or hungry or having to urinate more than normal. You may also feel very tired or have blurry vision.  unusually weak Side effects that usually do not require medical attention (report to your doctor or health care professional if they continue or are bothersome):  change in sex drive or performance  dry mouth  headache  stomach upset This list may not describe all possible side effects. Call your doctor for medical advice about side effects. You may report side effects to FDA at 1-800-FDA-1088. Where should I keep my medicine? Keep out of the reach of children. Store at room temperature between 15 and 30 degrees C (59 and 86 degrees F). Do not freeze. Protect from light and moisture. Keep container closed tightly. Throw away any  unused medicine after the expiration date. NOTE: This sheet is a summary. It may not cover all possible information. If you have questions about this medicine, talk to your doctor, pharmacist, or health care provider.  2020 Elsevier/Gold Standard (2018-07-30 09:25:06)

## 2019-09-24 NOTE — Progress Notes (Signed)
Electrophysiology Office Note   Date:  09/24/2019   ID:  Lisa Cox, DOB 02/11/1946, MRN [id number]  PCP:  Hoyt Koch, MD  Cardiologist:  Ellyn Hack Primary Electrophysiologist:  [name], MD    No chief complaint on file.    History of Present Illness: Lisa Cox is a 73 y.o. female who is being seen today for the evaluation of PVCs at the request of Glenetta Hew. Presenting today for electrophysiology evaluation.  Is a history of hypertension, hyperlipidemia, and palpitations.  She was found to have a high burden of PVCs.  She was put on diltiazem, but did not tolerate the medication.  She continues to have palpitations, but minimal dyspnea or dizziness.  She is back on her atenolol after wearing her monitor.  Her PVC burden has gone down since starting the atenolol.  She does get mildly short of breath when she exerts herself, and she feels that her PVCs may contribute to this.  She was switched to mexiletine at her last visit as her QRS widened on the flecainide.  Today, denies symptoms of palpitations, chest pain, shortness of breath, orthopnea, PND, lower extremity edema, claudication, dizziness, presyncope, syncope, bleeding, or neurologic sequela. The patient is tolerating medications without difficulties.  Overall she is doing well.  She is noted no further episodes of PVCs since her ablation.  She is able to do all of her daily activities.  Her blood pressure is high today and is usually high at home.  Blood pressures at home range from the Q000111Q to Q000111Q systolic.   Past Medical History:  Diagnosis Date  . Anxiety   . Bell's palsy 2009  . Cancer ([place]) Feb.2011   breast,lumpectomy right side followed by radiation  . Depression   . GERD (gastroesophageal reflux disease)   . Hyperlipidemia   . Hypertension   . Palpitations    Frequent PVCs with some bigeminy noted on 48-hour monitor. No arrhythmias.  . Personal history of radiation therapy     Past Surgical History:  Procedure Laterality Date  . BREAST LUMPECTOMY Right Feb. 2011   right breast,followed by radiation therapy  . CARDIAC EVENT MONITOR     Predominantly sinus rhythm with first-degree block. Minimum heart rate 63 bpm. Maximum heart rate 139 bpm. Average is 85 bpm. 17.5% PVCs.  V bigem & Trigem noted.    Marland Kitchen EYE SURGERY  1953   skin removal in eye  . GXT  04/2017   Exercise tolerance test off of beta-blockers: Normal blood pressure response to exercise.  No EKG changes to suggest ischemic changes.  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  Seen at rest, exercise and recovery.  Marland Kitchen NM MYOVIEW LTD  08/2018   LOW RISK. No ischemia or infarction  . PVC ABLATION N/A 08/19/2019   Procedure: PVC ABLATION;  Surgeon: Constance Haw, MD;  Location: [facility];  Service: Cardiovascular;  Laterality: N/A;  . TRANSTHORACIC ECHOCARDIOGRAM  08/2018   EF 55-60%.  No RWMA. Gr 2 DD. Mod AI.  Marland Kitchen TUBAL LIGATION  1978     Current Outpatient Medications  Medication Sig Dispense Refill  . aspirin 81 MG tablet Take 81 mg by mouth every evening.     Marland Kitchen atenolol (TENORMIN) 50 MG tablet Take 1 tablet (50 mg total) by mouth 2 (two) times daily. 180 tablet 3  . atorvastatin (LIPITOR) 20 MG tablet Take 1 tablet (20 mg total) by mouth daily. 90 tablet 3  .  carboxymethylcellulose (REFRESH TEARS) 0.5 % SOLN Place 1 drop into both eyes 3 (three) times daily as needed (dry eye).     . Cholecalciferol (VITAMIN D3) 50 MCG (2000 UT) TABS Take 2,000 Units by mouth daily.     Marland Kitchen dimenhyDRINATE (DRAMAMINE) 50 MG tablet Take 50 mg by mouth at bedtime. Must take with Lorazepam    . famotidine (PEPCID) 10 MG tablet Take 10 mg by mouth at bedtime as needed for heartburn or indigestion.     Marland Kitchen ibuprofen (ADVIL) 200 MG tablet Take 600 mg by mouth every 6 (six) hours as needed for headache.    Marland Kitchen LORazepam (ATIVAN) 1 MG tablet TAKE 1 TABLET BY MOUTH EVERYDAY AT BEDTIME 30 tablet 2  . Multiple  Vitamins-Minerals (PRESERVISION AREDS 2) CAPS Take 1 capsule by mouth 2 (two) times daily.    . phenazopyridine (PYRIDIUM) 100 MG tablet Take 1 tablet (100 mg total) by mouth 3 (three) times daily as needed for pain. 10 tablet 0  . ramipril (ALTACE) 10 MG capsule Take 10 mg by mouth 2 (two) times daily.    . sodium chloride (OCEAN) 0.65 % SOLN nasal spray Place 1 spray into both nostrils as needed for congestion.     No current facility-administered medications for this visit.     Allergies:   Amlodipine, Crestor [rosuvastatin calcium], Erythromycin, Macrobid [nitrofurantoin monohyd macro], Niacin and related, Restoril, and Zolpidem tartrate   Social History:  The patient  reports that she has never smoked. She has never used smokeless tobacco. She reports that she does not drink alcohol or use drugs.   Family History:  The patient's family history includes Breast cancer (age of onset: 37) in her paternal aunt; Heart attack in her father; Heart disease in her father; Hypertension in her brother and sister; Pancreatic cancer in her father; Stroke in her maternal aunt.   ROS:  Please see the history of present illness.   Otherwise, review of systems is positive for none.   All other systems are reviewed and negative.   PHYSICAL EXAM: VS:  BP (!) 170/86   Pulse 70   Ht 5\' 5"  (1.651 m)   Wt 153 lb 9.6 oz (69.7 kg)   SpO2 97%   BMI 25.56 kg/m  , BMI Body mass index is 25.56 kg/m. GEN: Well nourished, well developed, in no acute distress  HEENT: normal  Neck: no JVD, carotid bruits, or masses Cardiac: RRR; no murmurs, rubs, or gallops,no edema  Respiratory:  clear to auscultation bilaterally, normal work of breathing GI: soft, nontender, nondistended, + BS MS: no deformity or atrophy  Skin: warm and dry Neuro:  Strength and sensation are intact Psych: euthymic mood, full affect  EKG:  EKG is ordered today. Personal review of the ekg ordered shows sinus rhythm, rate  Recent Labs:  05/26/2019: ALT 17; TSH 6.41 07/22/2019: BUN 13; Creatinine, Ser 1.01; Hemoglobin 13.4; Platelets 185; Potassium 4.3; Sodium 135    Lipid Panel     Component Value Date/Time   CHOL 201 (H) 05/26/2019 0735   TRIG 145.0 05/26/2019 0735   HDL 41.40 05/26/2019 0735   CHOLHDL 5 05/26/2019 0735   VLDL 29.0 05/26/2019 0735   LDLCALC 130 (H) 05/26/2019 0735     Wt Readings from Last 3 Encounters:  09/24/19 153 lb 9.6 oz (69.7 kg)  09/01/19 155 lb 9.6 oz (70.6 kg)  08/19/19 155 lb (70.3 kg)      Other studies Reviewed: Additional studies/ records that were reviewed  today include: TTE 09/02/18  Review of the above records today demonstrates:   - Left ventricle: The cavity size was normal. Systolic function was   normal. The estimated ejection fraction was in the range of 55%   to 60%. Wall motion was normal; there were no regional wall   motion abnormalities. Features are consistent with a pseudonormal   left ventricular filling pattern, with concomitant abnormal   relaxation and increased filling pressure (grade 2 diastolic   dysfunction). - Aortic valve: There was moderate regurgitation. Regurgitation   pressure half-time: 422 ms. - Mitral valve: There was trivial regurgitation. - Right ventricle: Systolic function was normal. - Atrial septum: No defect or patent foramen ovale was identified. - Tricuspid valve: There was trivial regurgitation. - Pulmonic valve: There was no significant regurgitation.  SPECT 09/11/18  There was no ST segment deviation noted during stress.  The study is normal.  This is a low risk study with no evidence of perfusion abnormalities.  This study was not gated due to frequent ventricular ectopy.  Monitor 08/26/18 - personally reviewed  Predominantly sinus rhythm with first-degree block. Minimum heart rate 63 bpm. Maximum heart rate 139 bpm. Average is 85 bpm.  Very rare PACs noted. (<1%)  Frequent isolated PVCs (17.5%);  Ventricular  trigeminy and bigeminy noted on diary.  No ventricular tachycardia or other arrhythmias. No pauses or bradycardia.   Significant amount of ventricular ectopy noted, but no arrhythmia.  ASSESSMENT AND PLAN:  1.  PVCs: Not tolerated flecainide or mexiletine.  Is status post ablation 08/19/2019.  No further episodes of PVCs.  No changes.   2.  Hypertension: Blood pressure significantly elevated today.  It is also elevated at home.  At home she states that it is in the 130s to 150s.  We will stop her atenolol and start her on 25 mg of carvedilol.  Also start 25 mg of HCTZ.  Current medicines are reviewed at length with the patient today.   The patient does not have concerns regarding her medicines.  The following changes were made today: Stop atenolol, start HCTZ, carvedilol  Labs/ tests ordered today include:  Orders Placed This Encounter  Procedures  . EKG 12-Lead    Disposition:   FU with Will Camnitz 6 months  Signed, [name], MD  09/24/2019 10:01 AM     [facility] [address] [place] [address] [phone] (office) [phone] (fax)

## 2019-09-28 ENCOUNTER — Telehealth: Payer: Self-pay | Admitting: Cardiology

## 2019-09-28 NOTE — Telephone Encounter (Signed)
° ° °  Pt c/o medication issue:  1. Name of Medication: carvedilol (COREG) 25 MG tablet  2. How are you currently taking this medication (dosage and times per day)? Stopped taking 12/5  3. Are you having a reaction (difficulty breathing--STAT)? no  4. What is your medication issue? Patient states after she took med she started "feeling bad and  heart skipping"

## 2019-09-28 NOTE — Telephone Encounter (Signed)
lmtcb

## 2019-09-29 MED ORDER — ATENOLOL 50 MG PO TABS: 50.0000 mg | ORAL_TABLET | Freq: Two times a day (BID) | ORAL | 1 refills | Status: DC

## 2019-09-29 NOTE — Telephone Encounter (Signed)
Noticed my heart skipping like it did before I had the ablation. Seemed like it is not really affecting the blood pressure. Pt would like to return to the Atenolol, Dr. Curt Bears is agreeable. Med list updated in Epic,  Patient verbalized understanding and agreeable to plan.

## 2019-10-01 ENCOUNTER — Encounter: Payer: Self-pay | Admitting: Internal Medicine

## 2019-10-01 ENCOUNTER — Other Ambulatory Visit: Payer: Self-pay

## 2019-10-01 ENCOUNTER — Ambulatory Visit: Payer: PPO | Admitting: Internal Medicine

## 2019-10-01 ENCOUNTER — Ambulatory Visit (INDEPENDENT_AMBULATORY_CARE_PROVIDER_SITE_OTHER): Payer: PPO | Admitting: Internal Medicine

## 2019-10-01 VITALS — BP 132/86 | HR 85 | Temp 98.5°F | Ht 65.0 in | Wt 153.0 lb

## 2019-10-01 DIAGNOSIS — R399 Unspecified symptoms and signs involving the genitourinary system: Secondary | ICD-10-CM | POA: Diagnosis not present

## 2019-10-01 LAB — POCT URINALYSIS DIPSTICK
Bilirubin, UA: NEGATIVE
Blood, UA: POSITIVE
Glucose, UA: NEGATIVE
Ketones, UA: NEGATIVE
Nitrite, UA: NEGATIVE
Protein, UA: NEGATIVE
Spec Grav, UA: 1.015 (ref 1.010–1.025)
Urobilinogen, UA: 0.2 E.U./dL
pH, UA: 6 (ref 5.0–8.0)

## 2019-10-01 MED ORDER — SULFAMETHOXAZOLE-TRIMETHOPRIM 800-160 MG PO TABS: 1.0000 | ORAL_TABLET | Freq: Two times a day (BID) | ORAL | 0 refills | Status: DC
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
## 2019-10-02 DIAGNOSIS — R399 Unspecified symptoms and signs involving the genitourinary system: Secondary | ICD-10-CM | POA: Insufficient documentation

## 2019-10-02 HISTORY — DX: Unspecified symptoms and signs involving the genitourinary system: R39.9

## 2019-10-02 NOTE — Assessment & Plan Note (Signed)
U/A done in office consistent with infection. Rx bactrim.

## 2019-10-29 ENCOUNTER — Encounter: Payer: Self-pay | Admitting: Internal Medicine

## 2019-10-29 ENCOUNTER — Other Ambulatory Visit: Payer: Self-pay

## 2019-10-29 ENCOUNTER — Ambulatory Visit (INDEPENDENT_AMBULATORY_CARE_PROVIDER_SITE_OTHER): Payer: PPO | Admitting: Internal Medicine

## 2019-10-29 VITALS — BP 136/74 | HR 76 | Temp 97.8°F | Resp 16 | Ht 65.0 in | Wt 156.4 lb

## 2019-10-29 DIAGNOSIS — N3001 Acute cystitis with hematuria: Secondary | ICD-10-CM

## 2019-10-29 DIAGNOSIS — R300 Dysuria: Secondary | ICD-10-CM | POA: Insufficient documentation

## 2019-10-29 HISTORY — DX: Dysuria: R30.0

## 2019-10-29 HISTORY — DX: Acute cystitis with hematuria: N30.01

## 2019-10-29 LAB — POC URINALSYSI DIPSTICK (AUTOMATED)
Bilirubin, UA: POSITIVE
Blood, UA: POSITIVE
Glucose, UA: NEGATIVE
Ketones, UA: NEGATIVE
Nitrite, UA: POSITIVE
Protein, UA: POSITIVE — AB
Spec Grav, UA: 1.015 (ref 1.010–1.025)
Urobilinogen, UA: 1 E.U./dL
pH, UA: 6 (ref 5.0–8.0)

## 2019-10-29 MED ORDER — SULFAMETHOXAZOLE-TRIMETHOPRIM 800-160 MG PO TABS: 1.0000 | ORAL_TABLET | Freq: Two times a day (BID) | ORAL | 0 refills | Status: AC

## 2019-10-29 NOTE — Patient Instructions (Signed)

## 2019-10-29 NOTE — Progress Notes (Signed)
Subjective:  Patient ID: Lisa Cox, female    DOB: 09-13-1946  Age: 74 y.o. MRN: [id number]  CC: Urinary Tract Infection  This visit occurred during the SARS-CoV-2 public health emergency.  Safety protocols were in place, including screening questions prior to the visit, additional usage of staff PPE, and extensive cleaning of exam room while observing appropriate contact time as indicated for disinfecting solutions.   HPI [name] presents for concerns about Cox 2-day history of dysuria, urgency, frequency and chills.  She tells me that this will be her third UTI in the last 2 months.  She tells me she recently saw [facility] urologist and was told that her renal CT was normal.  Outpatient Medications Prior to Visit  Medication Sig Dispense Refill  . aspirin 81 MG tablet Take 81 mg by mouth every evening.     Marland Kitchen atenolol (TENORMIN) 50 MG tablet Take 1 tablet (50 mg total) by mouth 2 (two) times daily. 180 tablet 1  . atorvastatin (LIPITOR) 20 MG tablet Take 1 tablet (20 mg total) by mouth daily. 90 tablet 3  . carboxymethylcellulose (REFRESH TEARS) 0.5 % SOLN Place 1 drop into both eyes 3 (three) times daily as needed (dry eye).     . Cholecalciferol (VITAMIN D3) 50 MCG (2000 UT) TABS Take 2,000 Units by mouth daily.     Marland Kitchen dimenhyDRINATE (DRAMAMINE) 50 MG tablet Take 50 mg by mouth at bedtime. Must take with Lorazepam    . famotidine (PEPCID) 10 MG tablet Take 10 mg by mouth at bedtime as needed for heartburn or indigestion.     . hydrochlorothiazide (HYDRODIURIL) 25 MG tablet Take 1 tablet (25 mg total) by mouth daily. 30 tablet 6  . ibuprofen (ADVIL) 200 MG tablet Take 600 mg by mouth every 6 (six) hours as needed for headache.    Marland Kitchen LORazepam (ATIVAN) 1 MG tablet TAKE 1 TABLET BY MOUTH EVERYDAY AT BEDTIME 30 tablet 2  . Multiple Vitamins-Minerals (PRESERVISION AREDS 2) CAPS Take 1 capsule by mouth 2 (two) times daily.    . ramipril (ALTACE) 10 MG capsule Take 10 mg by mouth 2 (two) times  daily.    . sodium chloride (OCEAN) 0.65 % SOLN nasal spray Place 1 spray into both nostrils as needed for congestion.    . phenazopyridine (PYRIDIUM) 100 MG tablet Take 1 tablet (100 mg total) by mouth 3 (three) times daily as needed for pain. (Patient not taking: Reported on 10/29/2019) 10 tablet 0  . sulfamethoxazole-trimethoprim (BACTRIM DS) 800-160 MG tablet Take 1 tablet by mouth 2 (two) times daily. 10 tablet 0   No facility-administered medications prior to visit.    ROS Review of Systems  Constitutional: Positive for chills. Negative for fatigue and fever.  HENT: Negative.   Eyes: Negative for visual disturbance.  Respiratory: Negative for cough, chest tightness, shortness of breath and wheezing.   Cardiovascular: Negative for chest pain, palpitations and leg swelling.  Gastrointestinal: Negative for abdominal pain, constipation, diarrhea, nausea and vomiting.  Endocrine: Negative.   Genitourinary: Positive for dysuria, frequency and urgency. Negative for decreased urine volume, difficulty urinating and hematuria.  Musculoskeletal: Negative.   Skin: Negative.   Neurological: Negative.  Negative for dizziness, weakness and light-headedness.  Hematological: Negative.   Psychiatric/Behavioral: Negative.     Objective:  BP 136/74 (BP Location: Left Arm, Patient Position: Sitting, Cuff Size: Large)   Pulse 76   Temp 97.8 F (36.6 C) (Oral)   Resp 16   Ht  5\' 5"  (1.651 m)   Wt 156 lb 6 oz (70.9 kg)   SpO2 98%   BMI 26.02 kg/m   BP Readings from Last 3 Encounters:  10/29/19 136/74  10/01/19 132/86  09/24/19 (!) 170/86    Wt Readings from Last 3 Encounters:  10/29/19 156 lb 6 oz (70.9 kg)  10/01/19 153 lb (69.4 kg)  09/24/19 153 lb 9.6 oz (69.7 kg)    Physical Exam Vitals reviewed.  Constitutional:      General: She is not in acute distress.    Appearance: Normal appearance. She is not toxic-appearing.  HENT:     Mouth/Throat:     Mouth: Mucous membranes are  moist.  Eyes:     General: No scleral icterus.    Conjunctiva/sclera: Conjunctivae normal.  Cardiovascular:     Rate and Rhythm: Normal rate and regular rhythm.     Heart sounds: No murmur.  Pulmonary:     Effort: Pulmonary effort is normal.     Breath sounds: No stridor. No wheezing, rhonchi or rales.  Abdominal:     General: Abdomen is flat. Bowel sounds are normal. There is no distension.     Palpations: Abdomen is soft. There is no hepatomegaly or splenomegaly.     Tenderness: There is no abdominal tenderness. There is no right CVA tenderness or left CVA tenderness.  Musculoskeletal:        General: Normal range of motion.     Cervical back: Neck supple.     Right lower leg: No edema.     Left lower leg: No edema.  Lymphadenopathy:     Cervical: No cervical adenopathy.  Skin:    General: Skin is warm and dry.     Coloration: Skin is not pale.  Neurological:     General: No focal deficit present.     Mental Status: She is alert.     Lab Results  Component Value Date   WBC 8.8 07/22/2019   HGB 13.4 07/22/2019   HCT 40.2 07/22/2019   PLT 185 07/22/2019   GLUCOSE 68 07/22/2019   CHOL 201 (H) 05/26/2019   TRIG 145.0 05/26/2019   HDL 41.40 05/26/2019   LDLCALC 130 (H) 05/26/2019   ALT 17 05/26/2019   AST 18 05/26/2019   NA 135 07/22/2019   K 4.3 07/22/2019   CL 99 07/22/2019   CREATININE 1.01 (H) 07/22/2019   BUN 13 07/22/2019   CO2 27 07/22/2019   TSH 6.41 (H) 05/26/2019   INR 0.9 05/09/2008   HGBA1C 5.4 05/26/2019    Results for Lisa Cox (MRN [id number]) as of 10/31/2019 10:53  Ref. Range 10/29/2019 13:47  Bilirubin, UA Unknown POSITIVE  Clarity, UA Unknown clear  Color, UA Unknown orange  Glucose Latest Ref Range: Negative  Negative  Ketones, UA Unknown Negative  Leukocytes,UA Latest Ref Range: Negative  Large (3+) (Cox)  Nitrite, UA Unknown POSITIVE  pH, UA Latest Ref Range: 5.0 - 8.0  6.0  Protein,UA Latest Ref Range: Negative  Positive (Cox)   Specific Gravity, UA Latest Ref Range: 1.010 - 1.025  1.015  Urobilinogen, UA Latest Ref Range: 0.2 or 1.0 E.U./dL 1.0  RBC, UA Unknown POSITIVE      Assessment & Plan:   Barney was seen today for urinary tract infection.  Diagnoses and all orders for this visit:  Dysuria- Her UA is positive for large amount of leukocytes, red blood cells, and nitrites.  She does not tolerate nitrofurantoin so we will treat  with Bactrim DS.  Her urine culture is pending at this time. -     POCT Urinalysis Dipstick (Automated) -     Urine Culture Comprehensive  Acute cystitis with hematuria- I will change her antibiotic regimen if needed based on the results of the urine culture. -     Urine Culture Comprehensive -     sulfamethoxazole-trimethoprim (BACTRIM DS) 800-160 MG tablet; Take 1 tablet by mouth 2 (two) times daily for 5 days.   I have discontinued Joycelyn Schmid Cox. Gambrill's sulfamethoxazole-trimethoprim. I am also having her start on sulfamethoxazole-trimethoprim. Additionally, I am having her maintain her aspirin, carboxymethylcellulose, dimenhyDRINATE, phenazopyridine, famotidine, atorvastatin, Vitamin D3, ibuprofen, PreserVision AREDS 2, sodium chloride, LORazepam, ramipril, hydrochlorothiazide, and atenolol.  Meds ordered this encounter  Medications  . sulfamethoxazole-trimethoprim (BACTRIM DS) 800-160 MG tablet    Sig: Take 1 tablet by mouth 2 (two) times daily for 5 days.    Dispense:  10 tablet    Refill:  0     Follow-up: Return if symptoms worsen or fail to improve.  Scarlette Calico, MD

## 2019-10-31 ENCOUNTER — Encounter: Payer: Self-pay | Admitting: Internal Medicine

## 2019-10-31 LAB — CULTURE, URINE COMPREHENSIVE

## 2019-11-10 ENCOUNTER — Telehealth: Payer: Self-pay

## 2019-11-10 NOTE — Telephone Encounter (Signed)
Copied from [place] [id number]. Topic: General - Other >> Nov 10, 2019 11:05 AM Antonieta Iba C wrote: Reason for CRM: pt called in to be advise. Pt is scheduled [facility] the health dept to have covid vaccine. Pt was told to be advised by provider on if it is okay for her to have due to hypertension. Pt would also like to make provider aware that antibiotic cause nausea, pt did complete medication  [phone]

## 2019-11-10 NOTE — Telephone Encounter (Signed)
Is this patient a good candidate for the vaccination?

## 2019-11-12 ENCOUNTER — Ambulatory Visit (INDEPENDENT_AMBULATORY_CARE_PROVIDER_SITE_OTHER): Payer: PPO | Admitting: Internal Medicine

## 2019-11-12 ENCOUNTER — Encounter: Payer: Self-pay | Admitting: Internal Medicine

## 2019-11-12 DIAGNOSIS — R110 Nausea: Secondary | ICD-10-CM

## 2019-11-12 DIAGNOSIS — K219 Gastro-esophageal reflux disease without esophagitis: Secondary | ICD-10-CM

## 2019-11-12 DIAGNOSIS — R1013 Epigastric pain: Secondary | ICD-10-CM | POA: Insufficient documentation

## 2019-11-12 HISTORY — DX: Nausea: R11.0

## 2019-11-12 HISTORY — DX: Epigastric pain: R10.13

## 2019-11-12 MED ORDER — PANTOPRAZOLE SODIUM 40 MG PO TBEC: 40.0000 mg | DELAYED_RELEASE_TABLET | Freq: Every day | ORAL | 3 refills | Status: DC

## 2019-11-12 MED ORDER — SUCRALFATE 1 G PO TABS: 1.0000 g | ORAL_TABLET | Freq: Three times a day (TID) | ORAL | 0 refills | Status: DC

## 2019-11-12 NOTE — Patient Instructions (Signed)
Please take all new medication as prescribed - the protonix and carafate  Please stop the advil  Please continue all other medications as before, including the pepcid  Please have the pharmacy call with any other refills you may need.  Please keep your appointments with your specialists as you may have planned  Please go to the LAB at the blood drawing area for the tests to be done at the [facility] will be contacted by phone if any changes need to be made immediately.  Otherwise, you will receive a letter about your results with an explanation, but please check with MyChart first.  Please remember to sign up for MyChart if you have not done so, as this will be important to you in the future with finding out test results, communicating by private email, and scheduling acute appointments online when needed.

## 2019-11-12 NOTE — Progress Notes (Addendum)
Patient ID: Lisa Cox, female   DOB: October 22, 1946, 74 y.o.   MRN: [id number]  Virtual Visit via Video Note  I connected with Lisa Cox on 11/12/19 at  3:20 PM EST by a video enabled telemedicine application and verified that I am speaking with the correct person using two identifiers.  Location: Patient: at home Provider: at office   I discussed the limitations of evaluation and management by telemedicine and the availability of in person appointments. The patient expressed understanding and agreed to proceed.  History of Present Illness: Here with c/o 1 mo gradually worsening now moderate constant epigastric pain with nausea, no better with otc prilosec and stopping supplements, Denies increased ETOH or nsaid use. No fever, diarrhea, blood; very concerned as father had pancreatic ca.   Past Medical History:  Diagnosis Date  . Anxiety   . Bell's palsy 2009  . Cancer ([place]) Feb.2011   breast,lumpectomy right side followed by radiation  . Depression   . GERD (gastroesophageal reflux disease)   . Hyperlipidemia   . Hypertension   . Palpitations    Frequent PVCs with some bigeminy noted on 48-hour monitor. No arrhythmias.  . Personal history of radiation therapy    Past Surgical History:  Procedure Laterality Date  . BREAST LUMPECTOMY Right Feb. 2011   right breast,followed by radiation therapy  . CARDIAC EVENT MONITOR     Predominantly sinus rhythm with first-degree block. Minimum heart rate 63 bpm. Maximum heart rate 139 bpm. Average is 85 bpm. 17.5% PVCs.  V bigem & Trigem noted.    Marland Kitchen EYE SURGERY  1953   skin removal in eye  . GXT  04/2017   Exercise tolerance test off of beta-blockers: Normal blood pressure response to exercise.  No EKG changes to suggest ischemic changes.  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  Seen at rest, exercise and recovery.  Marland Kitchen NM MYOVIEW LTD  08/2018   LOW RISK. No ischemia or infarction  . PVC ABLATION N/A 08/19/2019   Procedure:  PVC ABLATION;  Surgeon: Constance Haw, MD;  Location: [facility];  Service: Cardiovascular;  Laterality: N/A;  . TRANSTHORACIC ECHOCARDIOGRAM  08/2018   EF 55-60%.  No RWMA. Gr 2 DD. Mod AI.  Marland Kitchen TUBAL LIGATION  1978    reports that she has never smoked. She has never used smokeless tobacco. She reports that she does not drink alcohol or use drugs. family history includes Breast cancer (age of onset: 12) in her paternal aunt; Heart attack in her father; Heart disease in her father; Hypertension in her brother and sister; Pancreatic cancer in her father; Stroke in her maternal aunt. Allergies  Allergen Reactions  . Amlodipine Other (See Comments)    Peripheral edema  . Crestor [Rosuvastatin Calcium] Other (See Comments)    Leg cramps  . Erythromycin Nausea Only  . Macrobid [Nitrofurantoin Monohyd Macro] Nausea And Vomiting  . Niacin And Related Itching  . Restoril Other (See Comments)    Reaction-"nervous"  . Zolpidem Tartrate     Reaction-"nervous"   Current Outpatient Medications on File Prior to Visit  Medication Sig Dispense Refill  . aspirin 81 MG tablet Take 81 mg by mouth every evening.     Marland Kitchen atenolol (TENORMIN) 50 MG tablet Take 1 tablet (50 mg total) by mouth 2 (two) times daily. 180 tablet 1  . atorvastatin (LIPITOR) 20 MG tablet Take 1 tablet (20 mg total) by mouth daily. 90 tablet 3  . carboxymethylcellulose (  REFRESH TEARS) 0.5 % SOLN Place 1 drop into both eyes 3 (three) times daily as needed (dry eye).     . Cholecalciferol (VITAMIN D3) 50 MCG (2000 UT) TABS Take 2,000 Units by mouth daily.     Marland Kitchen dimenhyDRINATE (DRAMAMINE) 50 MG tablet Take 50 mg by mouth at bedtime. Must take with Lorazepam    . famotidine (PEPCID) 10 MG tablet Take 10 mg by mouth at bedtime as needed for heartburn or indigestion.     . hydrochlorothiazide (HYDRODIURIL) 25 MG tablet Take 1 tablet (25 mg total) by mouth daily. 30 tablet 6  . ibuprofen (ADVIL) 200 MG tablet Take 600 mg by mouth  every 6 (six) hours as needed for headache.    Marland Kitchen LORazepam (ATIVAN) 1 MG tablet TAKE 1 TABLET BY MOUTH EVERYDAY AT BEDTIME 30 tablet 2  . Multiple Vitamins-Minerals (PRESERVISION AREDS 2) CAPS Take 1 capsule by mouth 2 (two) times daily.    . phenazopyridine (PYRIDIUM) 100 MG tablet Take 1 tablet (100 mg total) by mouth 3 (three) times daily as needed for pain. 10 tablet 0  . ramipril (ALTACE) 10 MG capsule Take 10 mg by mouth 2 (two) times daily.    . sodium chloride (OCEAN) 0.65 % SOLN nasal spray Place 1 spray into both nostrils as needed for congestion.     No current facility-administered medications on file prior to visit.    Observations/Objective: Alert, NAD, appropriate mood and affect, resps normal, cn 2-12 intact, moves all 4s, no visible rash or swelling Lab Results  Component Value Date   WBC 8.9 11/13/2019   HGB 13.5 11/13/2019   HCT 39.3 11/13/2019   PLT 211.0 11/13/2019   GLUCOSE 85 11/13/2019   CHOL 201 (H) 05/26/2019   TRIG 145.0 05/26/2019   HDL 41.40 05/26/2019   LDLCALC 130 (H) 05/26/2019   ALT 21 11/13/2019   AST 22 11/13/2019   NA 124 (L) 11/13/2019   K 3.8 11/13/2019   CL 87 (L) 11/13/2019   CREATININE 0.84 11/13/2019   BUN 9 11/13/2019   CO2 29 11/13/2019   TSH 6.41 (H) 05/26/2019   INR 0.9 05/09/2008   HGBA1C 5.4 05/26/2019   Assessment and Plan: See notes  Follow Up Instructions: See notes   I discussed the assessment and treatment plan with the patient. The patient was provided an opportunity to ask questions and all were answered. The patient agreed with the plan and demonstrated an understanding of the instructions.   The patient was advised to call back or seek an in-person evaluation if the symptoms worsen or if the condition fails to improve as anticipated.  Cathlean Cower, MD

## 2019-11-13 ENCOUNTER — Other Ambulatory Visit: Payer: Self-pay | Admitting: Internal Medicine

## 2019-11-13 ENCOUNTER — Telehealth: Payer: Self-pay | Admitting: Internal Medicine

## 2019-11-13 ENCOUNTER — Other Ambulatory Visit (INDEPENDENT_AMBULATORY_CARE_PROVIDER_SITE_OTHER): Payer: PPO

## 2019-11-13 DIAGNOSIS — R1013 Epigastric pain: Secondary | ICD-10-CM | POA: Diagnosis not present

## 2019-11-13 DIAGNOSIS — R110 Nausea: Secondary | ICD-10-CM

## 2019-11-13 DIAGNOSIS — K219 Gastro-esophageal reflux disease without esophagitis: Secondary | ICD-10-CM

## 2019-11-13 LAB — CBC WITH DIFFERENTIAL/PLATELET
Basophils Absolute: 0.1 10*3/uL (ref 0.0–0.1)
Basophils Relative: 0.8 % (ref 0.0–3.0)
Eosinophils Absolute: 0.6 10*3/uL (ref 0.0–0.7)
Eosinophils Relative: 6.5 % — ABNORMAL HIGH (ref 0.0–5.0)
HCT: 39.3 % (ref 36.0–46.0)
Hemoglobin: 13.5 g/dL (ref 12.0–15.0)
Lymphocytes Relative: 46.5 % — ABNORMAL HIGH (ref 12.0–46.0)
Lymphs Abs: 4.1 10*3/uL — ABNORMAL HIGH (ref 0.7–4.0)
MCHC: 34.4 g/dL (ref 30.0–36.0)
MCV: 88.9 fl (ref 78.0–100.0)
Monocytes Absolute: 0.7 10*3/uL (ref 0.1–1.0)
Monocytes Relative: 8.1 % (ref 3.0–12.0)
Neutro Abs: 3.4 10*3/uL (ref 1.4–7.7)
Neutrophils Relative %: 38.1 % — ABNORMAL LOW (ref 43.0–77.0)
Platelets: 211 10*3/uL (ref 150.0–400.0)
RBC: 4.42 Mil/uL (ref 3.87–5.11)
RDW: 13.5 % (ref 11.5–15.5)
WBC: 8.9 10*3/uL (ref 4.0–10.5)

## 2019-11-13 LAB — URINALYSIS, ROUTINE W REFLEX MICROSCOPIC
Bilirubin Urine: NEGATIVE
Ketones, ur: NEGATIVE
Nitrite: NEGATIVE
Specific Gravity, Urine: 1.01 (ref 1.000–1.030)
Total Protein, Urine: NEGATIVE
Urine Glucose: NEGATIVE
Urobilinogen, UA: 0.2 (ref 0.0–1.0)
pH: 6 (ref 5.0–8.0)

## 2019-11-13 LAB — BASIC METABOLIC PANEL
BUN: 9 mg/dL (ref 6–23)
CO2: 29 mEq/L (ref 19–32)
Calcium: 9.1 mg/dL (ref 8.4–10.5)
Chloride: 87 mEq/L — ABNORMAL LOW (ref 96–112)
Creatinine, Ser: 0.84 mg/dL (ref 0.40–1.20)
GFR: 65.94 mL/min (ref >60.00)
Glucose, Bld: 85 mg/dL (ref 70–99)
Potassium: 3.8 mEq/L (ref 3.5–5.1)
Sodium: 124 mEq/L — ABNORMAL LOW (ref 135–145)

## 2019-11-13 LAB — HEPATIC FUNCTION PANEL
ALT: 21 U/L (ref 0–35)
AST: 22 U/L (ref 0–37)
Albumin: 4 g/dL (ref 3.5–5.2)
Alkaline Phosphatase: 83 U/L (ref 39–117)
Bilirubin, Direct: 0.1 mg/dL (ref 0.0–0.3)
Total Bilirubin: 0.7 mg/dL (ref 0.2–1.2)
Total Protein: 6.7 g/dL (ref 6.0–8.3)

## 2019-11-13 LAB — H. PYLORI ANTIBODY, IGG: H Pylori IgG: NEGATIVE

## 2019-11-13 LAB — LIPASE: Lipase: 129 U/L — ABNORMAL HIGH (ref 11.0–59.0)

## 2019-11-13 MED ORDER — CEPHALEXIN 500 MG PO CAPS: 500.0000 mg | ORAL_CAPSULE | Freq: Three times a day (TID) | ORAL | 0 refills | Status: AC

## 2019-11-13 NOTE — Telephone Encounter (Signed)
   Patient requesting referral to [facility], fax [phone]  Patient also requesting CMA call her today to discuss "pancreatitis" and what she should do over the weekend, regarding diet and activity.

## 2019-11-15 ENCOUNTER — Encounter: Payer: Self-pay | Admitting: Internal Medicine

## 2019-11-15 NOTE — Assessment & Plan Note (Signed)
For antiemetic prn

## 2019-11-15 NOTE — Assessment & Plan Note (Signed)
To PPI as above,  to f/u any worsening symptoms or concerns

## 2019-11-15 NOTE — Assessment & Plan Note (Signed)
?   Etiology, doubt cardiac or GB related, cant r/o gastritis most likely, for stop advil, start protonix and carafate and labs as ordered  I spent 30 minutes preparing to see the patient by review of recent labs, imaging and procedures, obtaining and reviewing separately obtained history, communicating with the patient and family or caregiver, ordering medications, tests or procedures, and documenting clinical information in the EHR including the differential Dx, treatment, and any further evaluation and other management of abd pain epigastric, nausea , GERD

## 2019-11-16 ENCOUNTER — Ambulatory Visit (INDEPENDENT_AMBULATORY_CARE_PROVIDER_SITE_OTHER): Payer: PPO | Admitting: Internal Medicine

## 2019-11-16 ENCOUNTER — Encounter: Payer: Self-pay | Admitting: Internal Medicine

## 2019-11-16 ENCOUNTER — Other Ambulatory Visit: Payer: Self-pay

## 2019-11-16 VITALS — BP 118/78 | HR 65 | Temp 98.1°F | Ht 65.0 in | Wt 150.0 lb

## 2019-11-16 DIAGNOSIS — E871 Hypo-osmolality and hyponatremia: Secondary | ICD-10-CM | POA: Diagnosis not present

## 2019-11-16 DIAGNOSIS — R1013 Epigastric pain: Secondary | ICD-10-CM | POA: Diagnosis not present

## 2019-11-16 DIAGNOSIS — K8500 Idiopathic acute pancreatitis without necrosis or infection: Secondary | ICD-10-CM

## 2019-11-16 DIAGNOSIS — R748 Abnormal levels of other serum enzymes: Secondary | ICD-10-CM | POA: Diagnosis not present

## 2019-11-16 LAB — COMPREHENSIVE METABOLIC PANEL
ALT: 19 U/L (ref 0–35)
AST: 20 U/L (ref 0–37)
Albumin: 4 g/dL (ref 3.5–5.2)
Alkaline Phosphatase: 77 U/L (ref 39–117)
BUN: 9 mg/dL (ref 6–23)
CO2: 31 mEq/L (ref 19–32)
Calcium: 9.2 mg/dL (ref 8.4–10.5)
Chloride: 93 mEq/L — ABNORMAL LOW (ref 96–112)
Creatinine, Ser: 0.79 mg/dL (ref 0.40–1.20)
GFR: 70.78 mL/min (ref >60.00)
Glucose, Bld: 94 mg/dL (ref 70–99)
Potassium: 4.4 mEq/L (ref 3.5–5.1)
Sodium: 128 mEq/L — ABNORMAL LOW (ref 135–145)
Total Bilirubin: 0.4 mg/dL (ref 0.2–1.2)
Total Protein: 6.5 g/dL (ref 6.0–8.3)

## 2019-11-16 LAB — CBC
HCT: 38 % (ref 36.0–46.0)
Hemoglobin: 12.8 g/dL (ref 12.0–15.0)
MCHC: 33.6 g/dL (ref 30.0–36.0)
MCV: 90.1 fl (ref 78.0–100.0)
Platelets: 189 10*3/uL (ref 150.0–400.0)
RBC: 4.21 Mil/uL (ref 3.87–5.11)
RDW: 13.4 % (ref 11.5–15.5)
WBC: 8.2 10*3/uL (ref 4.0–10.5)

## 2019-11-16 LAB — VITAMIN D 25 HYDROXY (VIT D DEFICIENCY, FRACTURES): VITD: 38.43 ng/mL (ref 30.00–100.00)

## 2019-11-16 LAB — LIPASE: Lipase: 68 U/L — ABNORMAL HIGH (ref 11.0–59.0)

## 2019-11-16 LAB — TSH: TSH: 3.43 u[IU]/mL (ref 0.35–4.50)

## 2019-11-16 NOTE — Patient Instructions (Signed)
We will check the labs today and get you in for the CT scan.

## 2019-11-16 NOTE — Progress Notes (Signed)
   Subjective:   Patient ID: Lisa Cox, female    DOB: 05-04-1946, 74 y.o.   MRN: [id number]  HPI The patient is a 74 YO female coming in for follow up of stomach problems. Was having nausea and midstomach pain. She was seen last week virtually and given PPI and carafate with labs. Lipase was elevated which worried her given family history of pancreatic cancer. She had self-limited diet based on bland foods. She is doing okay now. Still having a little nausea but pain is almost gone. She is taking the PPI still. Would like referral to GI for workup of the elevated lipase.   Review of Systems  Constitutional: Negative.   HENT: Negative.   Eyes: Negative.   Respiratory: Negative for cough, chest tightness and shortness of breath.   Cardiovascular: Negative for chest pain, palpitations and leg swelling.  Gastrointestinal: Negative for abdominal distention, abdominal pain, constipation, diarrhea, nausea and vomiting.  Musculoskeletal: Negative.   Skin: Negative.   Neurological: Negative.   Psychiatric/Behavioral: Negative.     Objective:  Physical Exam Constitutional:      Appearance: She is well-developed.  HENT:     Head: Normocephalic and atraumatic.  Cardiovascular:     Rate and Rhythm: Normal rate and regular rhythm.  Pulmonary:     Effort: Pulmonary effort is normal. No respiratory distress.     Breath sounds: Normal breath sounds. No wheezing or rales.  Abdominal:     General: Bowel sounds are normal. There is no distension.     Palpations: Abdomen is soft.     Tenderness: There is no abdominal tenderness. There is no rebound.  Musculoskeletal:     Cervical back: Normal range of motion.  Skin:    General: Skin is warm and dry.  Neurological:     Mental Status: She is alert and oriented to person, place, and time.     Coordination: Coordination normal.     Vitals:   11/16/19 1414  BP: 118/78  Pulse: 65  Temp: 98.1 F (36.7 C)  TempSrc: Oral  SpO2: 98%   Weight: 150 lb (68 kg)  Height: 5\' 5"  (1.651 m)    This visit occurred during the SARS-CoV-2 public health emergency.  Safety protocols were in place, including screening questions prior to the visit, additional usage of staff PPE, and extensive cleaning of exam room while observing appropriate contact time as indicated for disinfecting solutions.   Assessment & Plan:

## 2019-11-16 NOTE — Telephone Encounter (Signed)
I decline  Pt should be referred by her PCP who saw her at [facility] today

## 2019-11-18 ENCOUNTER — Encounter: Payer: Self-pay | Admitting: Internal Medicine

## 2019-11-18 DIAGNOSIS — E871 Hypo-osmolality and hyponatremia: Secondary | ICD-10-CM

## 2019-11-18 DIAGNOSIS — R748 Abnormal levels of other serum enzymes: Secondary | ICD-10-CM | POA: Insufficient documentation

## 2019-11-18 HISTORY — DX: Hypo-osmolality and hyponatremia: E87.1

## 2019-11-18 HISTORY — DX: Abnormal levels of other serum enzymes: R74.8

## 2019-11-18 NOTE — Assessment & Plan Note (Signed)
We talked about how her levels were not very high to make it clear pancreatitis. We talked about how this could have been related to some stomach upset. Not currently having nausea or vomiting or pain and advised she can advance diet as tolerated. Rechecking lipase today. Checking CT abdomen/pelvis given family history of pancreatic cancer and to establish if this was pancreatitis. Recheck CBC and CMP.

## 2019-11-18 NOTE — Assessment & Plan Note (Addendum)
Overall improving and may have been related to gastritis, pancreatitis. Continue PPI for now and recheck labs today as well as CT abdomen/pelvis to assess etiology. Referral to GI done as well.

## 2019-11-18 NOTE — Assessment & Plan Note (Signed)
This could have been related to bland diet with stomach symptoms. Recheck sodium today. There are no symptoms of low sodium today. Talked to her about limiting free water.

## 2019-11-20 DIAGNOSIS — R110 Nausea: Secondary | ICD-10-CM | POA: Diagnosis not present

## 2019-11-20 DIAGNOSIS — R748 Abnormal levels of other serum enzymes: Secondary | ICD-10-CM | POA: Diagnosis not present

## 2019-11-20 DIAGNOSIS — K219 Gastro-esophageal reflux disease without esophagitis: Secondary | ICD-10-CM | POA: Diagnosis not present

## 2019-11-20 DIAGNOSIS — Z800 Family history of malignant neoplasm of digestive organs: Secondary | ICD-10-CM | POA: Diagnosis not present

## 2019-11-25 ENCOUNTER — Other Ambulatory Visit: Payer: Self-pay | Admitting: Cardiology

## 2019-11-25 ENCOUNTER — Other Ambulatory Visit: Payer: Self-pay | Admitting: Internal Medicine

## 2019-11-25 ENCOUNTER — Ambulatory Visit
Admission: RE | Admit: 2019-11-25 | Discharge: 2019-11-25 | Disposition: A | Discharge status: Home / Self Care | Payer: PPO | Source: Ambulatory Visit | Attending: Internal Medicine | Admitting: Internal Medicine

## 2019-11-25 DIAGNOSIS — K8500 Idiopathic acute pancreatitis without necrosis or infection: Secondary | ICD-10-CM

## 2019-11-25 DIAGNOSIS — R109 Unspecified abdominal pain: Secondary | ICD-10-CM | POA: Diagnosis not present

## 2019-11-25 DIAGNOSIS — R110 Nausea: Secondary | ICD-10-CM | POA: Diagnosis not present

## 2019-11-25 MED ORDER — IOPAMIDOL (ISOVUE-300) INJECTION 61%
100.0000 mL | Freq: Once | INTRAVENOUS | Status: AC | PRN
  Administered 2019-11-25: 10:00:00 100 mL via INTRAVENOUS

## 2019-11-26 NOTE — Telephone Encounter (Signed)
Filled 10/27/2019 Lorazepam 1 Mg Tablet 30#  Last ov 11/16/19 Next ov n/s

## 2019-12-03 ENCOUNTER — Other Ambulatory Visit: Payer: Self-pay | Admitting: Internal Medicine

## 2019-12-04 DIAGNOSIS — R748 Abnormal levels of other serum enzymes: Secondary | ICD-10-CM | POA: Diagnosis not present

## 2019-12-18 DIAGNOSIS — K219 Gastro-esophageal reflux disease without esophagitis: Secondary | ICD-10-CM | POA: Diagnosis not present

## 2019-12-18 DIAGNOSIS — R748 Abnormal levels of other serum enzymes: Secondary | ICD-10-CM | POA: Diagnosis not present

## 2019-12-23 ENCOUNTER — Encounter: Payer: Self-pay | Admitting: Cardiology

## 2019-12-23 ENCOUNTER — Ambulatory Visit: Payer: PPO | Admitting: Cardiology

## 2019-12-23 ENCOUNTER — Other Ambulatory Visit: Payer: Self-pay

## 2019-12-23 VITALS — BP 162/72 | HR 71 | Ht 65.5 in | Wt 152.0 lb

## 2019-12-23 DIAGNOSIS — Z79899 Other long term (current) drug therapy: Secondary | ICD-10-CM

## 2019-12-23 DIAGNOSIS — E7800 Pure hypercholesterolemia, unspecified: Secondary | ICD-10-CM | POA: Diagnosis not present

## 2019-12-23 DIAGNOSIS — R008 Other abnormalities of heart beat: Secondary | ICD-10-CM

## 2019-12-23 DIAGNOSIS — I119 Hypertensive heart disease without heart failure: Secondary | ICD-10-CM

## 2019-12-23 NOTE — Patient Instructions (Signed)
Medication Instructions:   START TAKING  THE HYDROCHLOROTHIAZIDE  DAILY   *If you need a refill on your cardiac medications before your next appointment, please call your pharmacy*   Lab Work: Come to the office in 3 weeks of March 22 ,2021--BMP  If you have labs (blood work) drawn today and your tests are completely normal, you will receive your results only by: Marland Kitchen MyChart Message (if you have MyChart) OR . A paper copy in the mail If you have any lab test that is abnormal or we need to change your treatment, we will call you to review the results.   Testing/Procedures: Not needed   Follow-Up: At [facility], you and your health needs are our priority.  As part of our continuing mission to provide you with exceptional heart care, we have created designated Provider Care Teams.  These Care Teams include your primary Cardiologist (physician) and Advanced Practice Providers (APPs -  Physician Assistants and Nurse Practitioners) who all work together to provide you with the care you need, when you need it.   Your next appointment:   6 month(s)  The format for your next appointment:   In Person  Provider:   Glenetta Hew, MD   Other Instructions n/a

## 2019-12-23 NOTE — Progress Notes (Signed)
PCP: Hoyt Koch, MD  EP: Dr. Curt Bears   CLINIC NOTE    Chief Complaint  Patient presents with  . Follow-up    Doing better after PVC ablation     HISTORY OF PRESENT ILLNESS   Lisa Cox is a 74 y.o. female who is being seen today for six-month follow-up evaluation of frequent RVOT PVCs. Lisa Cox is a former patient of Dr. Warren Danes whom he last saw on 12/19/2015.  Lisa Cox was last seen by me in September 2020 --> she had met with Dr. Curt Bears, but also had been to [facility] electrophysiology for second opinion.  They had been adjusting her atenolol dosing and had tried mexiletine --> this was subsequently stopped because she just did not tolerate it at all..  She indicated that she preferred staying with Dr. Curt Bears for local EP.  I increased her atenolol to 50 mg daily.  Also started atorvastatin back.  She was seen by Dr. Curt Bears on 07/09/2019 in person then follow-up by telemedicine in October- discussed (RVOT) PVC ablation -scheduled for August 19, 2019.  Noted mild exertional dyspnea but mostly noted her symptomatic PVCs.  Recent Hospitalizations:   October 28-PVC ablation  Follow-up visit with Dr. Curt Bears on September 24, 2019-noted that she was doing well since her ablation.  No further palpitations noted since ablation.  Able to go back to regular activities.  Noted blood pressure range of 101 -751 systolic.   Reviewed CV studies   Studies Personally Reviewed - (if available, images/films reviewed: From Epic Chart or Care Everywhere)  No new studies   Lisa Cox returns back for general cardiology follow-up stating that she is doing much better after PVC ablation.  The major issue over the last couple months has been 3 consecutive UTIs.  This seems to be resolving.  She had some GI concerns but gastroenterologist felt that her symptoms were related to UTI.  Her PCP is recently increased her atorvastatin to 20 mg.  Then  following her blood pressures, she says at home her systolic blood pressures range in the 120 140 million mercury range.  She is concerned about having her blood pressure go down too much lower because she thinks she may get dizzy and lightheaded.  She has not had any syncope or near syncope.  Her palpitations are pretty much resolved since PVC ablation.  She remains on current dose of atenolol.  No significant exertional dyspnea or chest pain or pressure.  No heart failure symptoms.  Cardiovascular review of symptoms: positive for - Rare exertional dyspnea if she got tries to something quickly negative for - chest pain, dyspnea on exertion, edema, orthopnea, palpitations, paroxysmal nocturnal dyspnea, rapid heart rate, shortness of breath or syncope/ near syncope, TIA/amarusosi fugax, claudication   REVIEW OF SYMPTOMS    ROS: A comprehensive was performed. Review of Systems  Constitutional: Negative for malaise/fatigue.  HENT: Negative for congestion and nosebleeds.   Respiratory: Positive for shortness of breath (Baseline).   Cardiovascular:       Per history of present illness  Gastrointestinal: Negative for abdominal pain, blood in stool, heartburn, melena and nausea.  Genitourinary: Negative for hematuria.  Musculoskeletal: Positive for joint pain. Negative for myalgias.  Neurological: Negative for dizziness.  Endo/Heme/Allergies: Negative for environmental allergies.  Psychiatric/Behavioral: The patient is nervous/anxious.   All other systems reviewed and are negative.  I have reviewed and (if needed) personally updated the patient's problem list, medications, allergies, past  medical and surgical history, social and family history.    PAST MEDICAL/SURGICAL HISTORY    Past Medical History:  Diagnosis Date  . Anxiety   . Bell's palsy 2009  . Cancer ([place]) Feb.2011   breast,lumpectomy right side followed by radiation  . Depression   . GERD (gastroesophageal reflux disease)    . Hyperlipidemia   . Hypertension   . Palpitations    Frequent PVCs with some bigeminy noted on 48-hour monitor. No arrhythmias.  . Personal history of radiation therapy     Past Surgical History:  Procedure Laterality Date  . BREAST LUMPECTOMY Right Feb. 2011   right breast,followed by radiation therapy  . CARDIAC EVENT MONITOR     Predominantly sinus rhythm with first-degree block. Minimum heart rate 63 bpm. Maximum heart rate 139 bpm. Average is 85 bpm. 17.5% PVCs.  V bigem & Trigem noted.    Marland Kitchen EYE SURGERY  1953   skin removal in eye  . GXT  04/2017   Exercise tolerance test off of beta-blockers: Normal blood pressure response to exercise.  No EKG changes to suggest ischemic changes.  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  Seen at rest, exercise and recovery.  Marland Kitchen NM MYOVIEW LTD  08/2018   LOW RISK. No ischemia or infarction  . PVC ABLATION N/A 08/19/2019   Procedure: PVC ABLATION;  Surgeon: Constance Haw, MD;  Location: [place] [facility];  Service: Cardiovascular;  Laterality: N/A;  . TRANSTHORACIC ECHOCARDIOGRAM  08/2018   EF 55-60%.  No RWMA. Gr 2 DD. Mod AI.  Marland Kitchen TUBAL LIGATION  1978    Cardiac Event Monitor October 2019: Predominantly sinus rhythm with first-degree block. Minimum heart rate 63 bpm. Maximum heart rate 139 bpm. Average is 85 bpm. 17.5% PVCs.  V bigem & Trigem noted.     MEDICATIONS / ALLERGIES    Current Meds  Medication Sig  . aspirin 81 MG tablet Take 81 mg by mouth every evening.   Marland Kitchen atenolol (TENORMIN) 50 MG tablet Take 1 tablet (50 mg total) by mouth 2 (two) times daily.  Marland Kitchen atorvastatin (LIPITOR) 20 MG tablet Take 10 mg by mouth daily. Pt takes 10 mg daily   . carboxymethylcellulose (REFRESH TEARS) 0.5 % SOLN Place 1 drop into both eyes 3 (Lisa) times daily as needed (dry eye).   . Cholecalciferol (VITAMIN D3) 50 MCG (2000 UT) TABS Take 2,000 Units by mouth daily.   Marland Kitchen dimenhyDRINATE (DRAMAMINE) 50 MG tablet Take 50 mg by mouth at  bedtime. Must take with Lorazepam  . famotidine (PEPCID) 10 MG tablet Take 10 mg by mouth at bedtime as needed for heartburn or indigestion.   Marland Kitchen ibuprofen (ADVIL) 200 MG tablet Take 600 mg by mouth every 6 (six) hours as needed for headache.  Marland Kitchen LORazepam (ATIVAN) 1 MG tablet TAKE 1 TABLET BY MOUTH EVERYDAY AT BEDTIME  . Multiple Vitamins-Minerals (PRESERVISION AREDS 2) CAPS Take 1 capsule by mouth 2 (two) times daily.  . pantoprazole (PROTONIX) 40 MG tablet Take 1 tablet (40 mg total) by mouth daily.  . phenazopyridine (PYRIDIUM) 100 MG tablet Take 1 tablet (100 mg total) by mouth 3 (Lisa) times daily as needed for pain.  . ramipril (ALTACE) 10 MG capsule TAKE 2 CAPSULES BY MOUTH DAILY  . sodium chloride (OCEAN) 0.65 % SOLN nasal spray Place 1 spray into both nostrils as needed for congestion.  . sucralfate (CARAFATE) 1 g tablet TAKE 1 TABLET (1 G TOTAL) BY MOUTH 4 (FOUR) TIMES  DAILY - WITH MEALS AND AT BEDTIME.  . [DISCONTINUED] atorvastatin (LIPITOR) 20 MG tablet Take 1 tablet (20 mg total) by mouth daily.  - stopped Bactrim after UTI  Allergies  Allergen Reactions  . Amlodipine Other (See Comments)    Peripheral edema  . Crestor [Rosuvastatin Calcium] Other (See Comments)    Leg cramps  . Erythromycin Nausea Only  . Macrobid [Nitrofurantoin Monohyd Macro] Nausea And Vomiting  . Niacin And Related Itching  . Restoril Other (See Comments)    Reaction-"nervous"  . Zolpidem Tartrate     Reaction-"nervous"    SOCIAL HISTORY/FAMILY HISTORY   Social History   Tobacco Use  . Smoking status: Never Smoker  . Smokeless tobacco: Never Used  Substance Use Topics  . Alcohol use: No  . Drug use: No   Social History   Social History Narrative  . Not on file   Family History family history includes Breast cancer (age of onset: 75) in her paternal aunt; Heart attack in her father; Heart disease in her father; Hypertension in her brother and sister; Pancreatic cancer in her father;  Stroke in her maternal aunt.   OBJCTIVE -PE, EKG, labs   Wt Readings from Last 3 Encounters:  12/23/19 152 lb (68.9 kg)  11/16/19 150 lb (68 kg)  10/29/19 156 lb 6 oz (70.9 kg)    PHYSICAL EXAM BP (!) 162/72   Pulse 71   Ht 5' 5.5" (1.664 m)   Wt 152 lb (68.9 kg)   SpO2 97%   BMI 24.91 kg/m  Previous evaluation on August 19 showed blood pressure 117/65 and she is also recorded 134/79 mmHg. Physical Exam  Constitutional: She is oriented to person, place, and time. She appears well-developed and well-nourished. No distress.  Healthy-appearing.  Well-groomed  HENT:  Head: Normocephalic and atraumatic.  Neck: No hepatojugular reflux and no JVD present. Carotid bruit is not present.  Cardiovascular: Normal rate, regular rhythm, normal heart sounds and normal pulses.  No extrasystoles are present. PMI is not displaced. Exam reveals no gallop and no friction rub.  No murmur heard. Pulmonary/Chest: Breath sounds normal. No respiratory distress. She has no wheezes. She has no rales.  Musculoskeletal:        General: No edema. Normal range of motion.     Cervical back: Normal range of motion and neck supple.  Neurological: She is alert and oriented to person, place, and time. No cranial nerve deficit.  Psychiatric: She has a normal mood and affect. Her behavior is normal. Judgment and thought content normal.  Nursing note and vitals reviewed.   Adult ECG Report n/a  Other studies Reviewed: Additional studies/ records that were reviewed today include:  Recent Labs:  Managed by PCP Lab Results  Component Value Date   CREATININE 0.79 11/16/2019   BUN 9 11/16/2019   NA 128 (L) 11/16/2019   K 4.4 11/16/2019   CL 93 (L) 11/16/2019   CO2 31 11/16/2019   Lab Results  Component Value Date   CHOL 201 (H) 05/26/2019   HDL 41.40 05/26/2019   LDLCALC 130 (H) 05/26/2019   TRIG 145.0 05/26/2019   CHOLHDL 5 05/26/2019    ASSESSMENT AND PLAN   Problem List Items Addressed This Visit     Ventricular bigeminy seen on cardiac monitor (Chronic)    Now status post ablation with minimal palpitations now. Remains on beta-blocker.      Relevant Medications   atorvastatin (LIPITOR) 20 MG tablet   Other Relevant Orders  Basic metabolic panel   Hypercholesterolemia (Chronic)    Had her atorvastatin recently decreased if not mistaken to 20 mg daily.  With LDL of 130, I would probably consider keeping a 20 mg long she is able to tolerate.  Will defer to PCP who follows her labs.      Relevant Medications   atorvastatin (LIPITOR) 20 MG tablet   Benign hypertensive heart disease without heart failure - Primary (Chronic)    Euvolemic on exam, but does have some mild end of day swelling. She is on ACE inhibitor which we have split to twice daily to allow for more stable pressures along with her 50 mg atenolol.  Despite this, her blood pressures are still high.  Plan:  Per GDMT, will add diuretic 25 mg HCTZ.  She will need labs to be checked in 3 weeks following initiation of HCTZ.      Relevant Medications   atorvastatin (LIPITOR) 20 MG tablet   Other Relevant Orders   Basic metabolic panel    Other Visit Diagnoses    Medication management       Relevant Orders   Basic metabolic panel       [id number] Education: The signs and symptoms of COVID-19 were discussed with the patient and how to seek care for testing (follow up with PCP or arrange E-visit).   The importance of social distancing was discussed today.  I spent a total of 32mnutes with the patient. >  50% of the time was spent in direct patient consultation.  Additional time spent with chart review  / charting (studies, outside notes, etc): 8 Total Time: 26  min  Current medicines are reviewed at length with the patient today. (+/- concerns) n/a The following changes have been made: No change   Patient Instructions / Medication Changes & Studies & Tests Ordered   Patient Instructions  Medication  Instructions:   START TAKING  THE HYDROCHLOROTHIAZIDE  DAILY   *If you need a refill on your cardiac medications before your next appointment, please call your pharmacy*   Lab Work: Come to the office in 3 weeks of March 22 ,2021--BMP  If you have labs (blood work) drawn today and your tests are completely normal, you will receive your results only by: .Marland KitchenMyChart Message (if you have MyChart) OR . A paper copy in the mail If you have any lab test that is abnormal or we need to change your treatment, we will call you to review the results.   Testing/Procedures: Not needed   Follow-Up: [facility] and your [facility] needs are our [facility].  As part of our continuing mission to provide you with exceptional heart care, we have created designated Provider Care Teams.  These Care Teams include your primary Cardiologist (physician) and Advanced Practice Providers (APPs -  Physician Assistants and Nurse Practitioners) who all work together to provide you with the care you need, when you need it.   Your next appointment:   6 month(s)  The format for your next appointment:   In Person  Provider:   DGlenetta Hew MD   Other Instructions n/a   Studies Ordered:   Orders Placed This Encounter  Procedures  . Basic metabolic panel      DGlenetta Hew M.D., M.S. Interventional Cardiologist   Pager # [phone] # [phone] [place] [address]

## 2019-12-30 ENCOUNTER — Encounter: Payer: Self-pay | Admitting: Cardiology

## 2019-12-30 NOTE — Assessment & Plan Note (Signed)
Now status post ablation with minimal palpitations now. Remains on beta-blocker.

## 2019-12-30 NOTE — Assessment & Plan Note (Signed)
Euvolemic on exam, but does have some mild end of day swelling. She is on ACE inhibitor which we have split to twice daily to allow for more stable pressures along with her 50 mg atenolol.  Despite this, her blood pressures are still high.  Plan:  Per GDMT, will add diuretic 25 mg HCTZ.  She will need labs to be checked in 3 weeks following initiation of HCTZ.

## 2019-12-30 NOTE — Assessment & Plan Note (Signed)
Had her atorvastatin recently decreased if not mistaken to 20 mg daily.  With LDL of 130, I would probably consider keeping a 20 mg long she is able to tolerate.  Will defer to PCP who follows her labs.

## 2020-01-11 DIAGNOSIS — I119 Hypertensive heart disease without heart failure: Secondary | ICD-10-CM | POA: Diagnosis not present

## 2020-01-11 DIAGNOSIS — R008 Other abnormalities of heart beat: Secondary | ICD-10-CM | POA: Diagnosis not present

## 2020-01-11 DIAGNOSIS — Z79899 Other long term (current) drug therapy: Secondary | ICD-10-CM | POA: Diagnosis not present

## 2020-01-11 LAB — BASIC METABOLIC PANEL
BUN/Creatinine Ratio: 13 (ref 12–28)
BUN: 13 mg/dL (ref 8–27)
CO2: 28 mmol/L (ref 20–29)
Calcium: 9.3 mg/dL (ref 8.7–10.3)
Chloride: 84 mmol/L — ABNORMAL LOW (ref 96–106)
Creatinine, Ser: 0.98 mg/dL (ref 0.57–1.00)
GFR calc Af Amer: 65 mL/min/{1.73_m2} (ref >59)
GFR calc non Af Amer: 56 mL/min/{1.73_m2} — ABNORMAL LOW (ref >59)
Glucose: 95 mg/dL (ref 65–99)
Potassium: 4 mmol/L (ref 3.5–5.2)
Sodium: 125 mmol/L — ABNORMAL LOW (ref 134–144)

## 2020-01-13 ENCOUNTER — Other Ambulatory Visit: Payer: Self-pay

## 2020-01-13 ENCOUNTER — Encounter: Payer: Self-pay | Admitting: Family

## 2020-01-13 ENCOUNTER — Ambulatory Visit (INDEPENDENT_AMBULATORY_CARE_PROVIDER_SITE_OTHER): Payer: PPO | Admitting: Family

## 2020-01-13 VITALS — BP 116/72 | HR 68 | Temp 98.4°F | Ht 65.5 in | Wt 151.2 lb

## 2020-01-13 DIAGNOSIS — R300 Dysuria: Secondary | ICD-10-CM | POA: Diagnosis not present

## 2020-01-13 DIAGNOSIS — N390 Urinary tract infection, site not specified: Secondary | ICD-10-CM | POA: Diagnosis not present

## 2020-01-13 LAB — POC URINALSYSI DIPSTICK (AUTOMATED)
Glucose, UA: NEGATIVE
Ketones, UA: NEGATIVE
Protein, UA: NEGATIVE
Spec Grav, UA: 1.01 (ref 1.010–1.025)
Urobilinogen, UA: 1 E.U./dL
pH, UA: 6 (ref 5.0–8.0)

## 2020-01-13 MED ORDER — CIPROFLOXACIN HCL 250 MG PO TABS: 250.0000 mg | ORAL_TABLET | Freq: Two times a day (BID) | ORAL | 0 refills | Status: DC

## 2020-01-13 NOTE — Progress Notes (Signed)
Lisa Cox is a 74 y.o. female with the following history as recorded in EpicCare:  Patient Active Problem List   Diagnosis Date Noted  . Elevated lipase 11/18/2019  . Hyponatremia 11/18/2019  . GERD (gastroesophageal reflux disease) 11/12/2019  . Nausea 11/12/2019  . Abdominal pain, epigastric 11/12/2019  . Dysuria 10/29/2019  . Acute cystitis with hematuria 10/29/2019  . UTI symptoms 10/02/2019  . Fatigue 05/26/2019  . PVC (premature ventricular contraction) 10/31/2018  . Routine general medical examination at a health care facility 07/12/2017  . Essential hypertension 07/12/2017  . Frequent unifocal PVCs 04/02/2017  . Ventricular bigeminy seen on cardiac monitor 05/26/2016  . Insomnia 04/10/2016  . History of breast cancer 09/29/2011  . Hypercholesterolemia 07/11/2011  . Benign hypertensive heart disease without heart failure 07/11/2011  . Palpitations     Current Outpatient Medications  Medication Sig Dispense Refill  . aspirin 81 MG tablet Take 81 mg by mouth every evening.     Marland Kitchen atenolol (TENORMIN) 50 MG tablet Take 1 tablet (50 mg total) by mouth 2 (two) times daily. 180 tablet 1  . atorvastatin (LIPITOR) 20 MG tablet Take 10 mg by mouth daily. Pt takes 10 mg daily     . carboxymethylcellulose (REFRESH TEARS) 0.5 % SOLN Place 1 drop into both eyes 3 (three) times daily as needed (dry eye).     . Cholecalciferol (VITAMIN D3) 50 MCG (2000 UT) TABS Take 2,000 Units by mouth daily.     Marland Kitchen dimenhyDRINATE (DRAMAMINE) 50 MG tablet Take 50 mg by mouth at bedtime. Must take with Lorazepam    . famotidine (PEPCID) 10 MG tablet Take 10 mg by mouth at bedtime as needed for heartburn or indigestion.     Marland Kitchen ibuprofen (ADVIL) 200 MG tablet Take 600 mg by mouth every 6 (six) hours as needed for headache.    Marland Kitchen LORazepam (ATIVAN) 1 MG tablet TAKE 1 TABLET BY MOUTH EVERYDAY AT BEDTIME 30 tablet 5  . Multiple Vitamins-Minerals (PRESERVISION AREDS 2) CAPS Take 1 capsule by mouth 2 (two)  times daily.    . pantoprazole (PROTONIX) 40 MG tablet Take 1 tablet (40 mg total) by mouth daily. 90 tablet 3  . phenazopyridine (PYRIDIUM) 100 MG tablet Take 1 tablet (100 mg total) by mouth 3 (three) times daily as needed for pain. 10 tablet 0  . ramipril (ALTACE) 10 MG capsule TAKE 2 CAPSULES BY MOUTH DAILY 180 capsule 3  . sodium chloride (OCEAN) 0.65 % SOLN nasal spray Place 1 spray into both nostrils as needed for congestion.    . sucralfate (CARAFATE) 1 g tablet TAKE 1 TABLET (1 G TOTAL) BY MOUTH 4 (FOUR) TIMES DAILY - WITH MEALS AND AT BEDTIME. 120 tablet 3  . ciprofloxacin (CIPRO) 250 MG tablet Take 1 tablet (250 mg total) by mouth 2 (two) times daily. 10 tablet 0   No current facility-administered medications for this visit.    Allergies: Amlodipine, Crestor [rosuvastatin calcium], Erythromycin, Macrobid [nitrofurantoin monohyd macro], Niacin and related, Restoril, and Zolpidem tartrate  Past Medical History:  Diagnosis Date  . Anxiety   . Bell's palsy 2009  . Cancer ([place]) Feb.2011   breast,lumpectomy right side followed by radiation  . Depression   . GERD (gastroesophageal reflux disease)   . Hyperlipidemia   . Hypertension   . Palpitations    Frequent PVCs with some bigeminy noted on 48-hour monitor. No arrhythmias.  . Personal history of radiation therapy     Past Surgical History:  Procedure  Laterality Date  . BREAST LUMPECTOMY Right Feb. 2011   right breast,followed by radiation therapy  . CARDIAC EVENT MONITOR     Predominantly sinus rhythm with first-degree block. Minimum heart rate 63 bpm. Maximum heart rate 139 bpm. Average is 85 bpm. 17.5% PVCs.  V bigem & Trigem noted.    Marland Kitchen EYE SURGERY  1953   skin removal in eye  . GXT  04/2017   Exercise tolerance test off of beta-blockers: Normal blood pressure response to exercise.  No EKG changes to suggest ischemic changes.  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  Seen at rest, exercise and recovery.  Marland Kitchen NM  MYOVIEW LTD  08/2018   LOW RISK. No ischemia or infarction  . PVC ABLATION N/A 08/19/2019   Procedure: PVC ABLATION;  Surgeon: Constance Haw, MD;  Location: [place] [facility];  Service: Cardiovascular;  Laterality: N/A;  . TRANSTHORACIC ECHOCARDIOGRAM  08/2018   EF 55-60%.  No RWMA. Gr 2 DD. Mod AI.  Marland Kitchen TUBAL LIGATION  1978    Family History  Problem Relation Age of Onset  . Heart disease Father   . Heart attack Father   . Pancreatic cancer Father   . Hypertension Sister   . Hypertension Brother   . Stroke Maternal Aunt   . Breast cancer Paternal Aunt 72       x 2    Social History   Tobacco Use  . Smoking status: Never Smoker  . Smokeless tobacco: Never Used  Substance Use Topics  . Alcohol use: No    Subjective:  Recurrent UTIs; has been seen in November 2020, December 2020 and January 2019; symptoms started last Friday- took left over Keflex but still having burning, frequency; has seen urology in 2019 due to the frequent infections but did not feel that she needed long term treatment; patient is concerned that recurrent use of Bactrim recently was the cause of her pancreatic enzyme being elevated;  Objective:  Vitals:   01/13/20 1048  BP: 116/72  Pulse: 68  Temp: 98.4 F (36.9 C)  TempSrc: Oral  SpO2: 98%  Weight: 151 lb 3.2 oz (68.6 kg)  Height: 5' 5.5" (1.664 m)    General: Well developed, well nourished, in no acute distress  Skin : Warm and dry.  Head: Normocephalic and atraumatic  Lungs: Respirations unlabored;  CVS exam: normal rate and regular rhythm.  Neurologic: Alert and oriented; speech intact; face symmetrical; moves all extremities well; CNII-XII intact without focal deficit   Assessment:  1. Dysuria   2. Recurrent UTI     Plan:  Patient just completed Keflex with persisting symptoms; she is concerned about using Bactrim again and is allergic to [place];  Will try Cipro 250 mg bid x 5 days; she will return for repeat urine culture next  week to ensure complete resolution. She will also go back to her urologist due to the frequency of her infections in the past 5 months; referral updated;  This visit occurred during the SARS-CoV-2 public health emergency.  Safety protocols were in place, including screening questions prior to the visit, additional usage of staff PPE, and extensive cleaning of exam room while observing appropriate contact time as indicated for disinfecting solutions.     No follow-ups on file.  Orders Placed This Encounter  Procedures  . Urine Culture    Standing Status:   Future    Standing Expiration Date:   01/12/2021  . Ambulatory referral to Urology  Referral Priority:   Routine    Referral Type:   Consultation    Referral Reason:   Specialty Services Required    Requested Specialty:   Urology    Number of Visits Requested:   1    Requested Prescriptions   Signed Prescriptions Disp Refills  . ciprofloxacin (CIPRO) 250 MG tablet 10 tablet 0    Sig: Take 1 tablet (250 mg total) by mouth 2 (two) times daily.

## 2020-01-13 NOTE — Addendum Note (Signed)
Addended by: Marcina Millard on: 01/13/2020 03:29 PM   Modules accepted: Orders

## 2020-01-13 NOTE — Addendum Note (Signed)
Addended by: Trenda Moots on: [id number] 11:27 AM   Modules accepted: Orders

## 2020-01-15 ENCOUNTER — Telehealth: Payer: Self-pay | Admitting: *Deleted

## 2020-01-15 DIAGNOSIS — E871 Hypo-osmolality and hyponatremia: Secondary | ICD-10-CM

## 2020-01-15 LAB — URINE CULTURE

## 2020-01-15 NOTE — Telephone Encounter (Signed)
Patient reviewed  Result via mychart. Calling to inform patient to have labs to be  done next week as requested by Dr Ellyn Hack.   patient did not answer --left  above  Instructions on voicemail  Per  dpr.   Any question may call back

## 2020-01-15 NOTE — Telephone Encounter (Signed)
-----   Message from Leonie Man, MD sent at 01/15/2020  3:04 PM EDT ----- So this is a very reason why we are checking labs will restart HCTZ.  Sodium level is going down.Lets stop HCTZ.  We will need to readdress with a different medication for blood pressure.  Recommend liberalizing salt intake for the next 2 weeks.  Recheck BMP panel next Tuesday or Wednesday.Glenetta Hew, MD

## 2020-01-18 ENCOUNTER — Other Ambulatory Visit: Payer: Self-pay | Admitting: Family

## 2020-01-18 ENCOUNTER — Other Ambulatory Visit: Payer: PPO

## 2020-01-18 DIAGNOSIS — N390 Urinary tract infection, site not specified: Secondary | ICD-10-CM | POA: Diagnosis not present

## 2020-01-19 DIAGNOSIS — H353131 Nonexudative age-related macular degeneration, bilateral, early dry stage: Secondary | ICD-10-CM | POA: Diagnosis not present

## 2020-01-19 DIAGNOSIS — Z961 Presence of intraocular lens: Secondary | ICD-10-CM | POA: Diagnosis not present

## 2020-01-19 DIAGNOSIS — H26493 Other secondary cataract, bilateral: Secondary | ICD-10-CM | POA: Diagnosis not present

## 2020-01-19 DIAGNOSIS — H52203 Unspecified astigmatism, bilateral: Secondary | ICD-10-CM | POA: Diagnosis not present

## 2020-01-20 ENCOUNTER — Encounter: Payer: Self-pay | Admitting: Cardiology

## 2020-01-20 ENCOUNTER — Other Ambulatory Visit: Payer: Self-pay

## 2020-01-20 DIAGNOSIS — E871 Hypo-osmolality and hyponatremia: Secondary | ICD-10-CM

## 2020-01-20 LAB — BASIC METABOLIC PANEL
BUN/Creatinine Ratio: 13 (ref 12–28)
BUN: 11 mg/dL (ref 8–27)
CO2: 26 mmol/L (ref 20–29)
Calcium: 9.2 mg/dL (ref 8.7–10.3)
Chloride: 94 mmol/L — ABNORMAL LOW (ref 96–106)
Creatinine, Ser: 0.86 mg/dL (ref 0.57–1.00)
GFR calc Af Amer: 76 mL/min/{1.73_m2} (ref >59)
GFR calc non Af Amer: 66 mL/min/{1.73_m2} (ref >59)
Glucose: 75 mg/dL (ref 65–99)
Potassium: 4.6 mmol/L (ref 3.5–5.2)
Sodium: 131 mmol/L — ABNORMAL LOW (ref 134–144)

## 2020-01-20 LAB — URINE CULTURE: Result:: NO GROWTH

## 2020-02-09 DIAGNOSIS — N952 Postmenopausal atrophic vaginitis: Secondary | ICD-10-CM | POA: Diagnosis not present

## 2020-02-09 DIAGNOSIS — N8500 Endometrial hyperplasia, unspecified: Secondary | ICD-10-CM | POA: Diagnosis not present

## 2020-02-09 DIAGNOSIS — N3020 Other chronic cystitis without hematuria: Secondary | ICD-10-CM | POA: Diagnosis not present

## 2020-03-25 ENCOUNTER — Other Ambulatory Visit: Payer: Self-pay | Admitting: Obstetrics and Gynecology

## 2020-03-25 DIAGNOSIS — Z1231 Encounter for screening mammogram for malignant neoplasm of breast: Secondary | ICD-10-CM

## 2020-04-14 ENCOUNTER — Ambulatory Visit: Payer: PPO | Admitting: Cardiology

## 2020-04-14 ENCOUNTER — Other Ambulatory Visit: Payer: Self-pay

## 2020-04-14 ENCOUNTER — Encounter: Payer: Self-pay | Admitting: Cardiology

## 2020-04-14 VITALS — BP 178/80 | HR 69 | Ht 65.5 in | Wt 152.6 lb

## 2020-04-14 DIAGNOSIS — I493 Ventricular premature depolarization: Secondary | ICD-10-CM | POA: Diagnosis not present

## 2020-04-14 NOTE — Patient Instructions (Signed)
Medication Instructions:  Your physician recommends that you continue on your current medications as directed. Please refer to the Current Medication list given to you today.  *If you need a refill on your cardiac medications before your next appointment, please call your pharmacy*   Lab Work: None ordered   Testing/Procedures: None ordered   Follow-Up: At [facility], you and your health needs are our priority.  As part of our continuing mission to provide you with exceptional heart care, we have created designated Provider Care Teams.  These Care Teams include your primary Cardiologist (physician) and Advanced Practice Providers (APPs -  Physician Assistants and Nurse Practitioners) who all work together to provide you with the care you need, when you need it.  We recommend signing up for the patient portal called "MyChart".  Sign up information is provided on this After Visit Summary.  MyChart is used to connect with patients for Virtual Visits (Telemedicine).  Patients are able to view lab/test results, encounter notes, upcoming appointments, etc.  Non-urgent messages can be sent to your provider as well.   To learn more about what you can do with MyChart, go to [email or web address].    Your next appointment:   6 month(s)  The format for your next appointment:   In Person  Provider:   Will Camnitz, MD   Thank you for choosing [facility]!!   Viviann Broyles, RN [phone]    Other Instructions    

## 2020-04-14 NOTE — Progress Notes (Signed)
Electrophysiology Office Note   Date:  04/14/2020   ID:  Lisa Cox, DOB 06/03/1946, MRN [id number]  PCP:  Hoyt Koch, MD  Cardiologist:  Ellyn Hack Primary Electrophysiologist:  Lalita Ebel Meredith Leeds, MD    No chief complaint on file.    History of Present Illness: Lisa Cox is a 74 y.o. female who is being seen today for the evaluation of PVCs at the request of Glenetta Hew. Presenting today for electrophysiology evaluation.  Is a history of hypertension, hyperlipidemia, and palpitations.  She was found to have a high burden of PVCs.  She did not tolerate diltiazem, flecainide, mexiletine.  She is now status post PVC ablation 08/19/2019.  Today, denies symptoms of palpitations, chest pain, shortness of breath, orthopnea, PND, lower extremity edema, claudication, dizziness, presyncope, syncope, bleeding, or neurologic sequela. The patient is tolerating medications without difficulties.  Overall she is doing well.  She has no chest pain or shortness of breath.  She is able to do all of her daily activities.  Since her PVC ablation, she has had no further episodes.   Past Medical History:  Diagnosis Date  . Abdominal pain, epigastric 11/12/2019  . Acute cystitis with hematuria 10/29/2019  . Anxiety   . Bell's palsy 2009  . Benign hypertensive heart disease without heart failure 07/11/2011  . Cancer ([place]) Feb.2011   breast,lumpectomy right side followed by radiation  . Depression   . Dysuria 10/29/2019  . Elevated lipase 11/18/2019  . Fatigue 05/26/2019  . Frequent unifocal PVCs 04/02/2017   She was post of had an echocardiogram done which I don't see having been done. At this point, I think with her not having too many symptoms of PVCs we can watch for now and then maybe oracular follow-up.  Marland Kitchen GERD (gastroesophageal reflux disease)   . Hypercholesterolemia 07/11/2011  . Hyperlipidemia   . Hypertension   . Hyponatremia 11/18/2019  . Insomnia 04/10/2016  . Nausea  11/12/2019  . Palpitations    Frequent PVCs with some bigeminy noted on 48-hour monitor. No arrhythmias.  . Personal history of radiation therapy   . PVC (premature ventricular contraction) 10/31/2018  . Routine general medical examination at a health care facility 07/12/2017  . UTI symptoms 10/02/2019  . Ventricular bigeminy seen on cardiac monitor 05/26/2016   Past Surgical History:  Procedure Laterality Date  . BREAST LUMPECTOMY Right Feb. 2011   right breast,followed by radiation therapy  . CARDIAC EVENT MONITOR     Predominantly sinus rhythm with first-degree block. Minimum heart rate 63 bpm. Maximum heart rate 139 bpm. Average is 85 bpm. 17.5% PVCs.  V bigem & Trigem noted.    Marland Kitchen EYE SURGERY  1953   skin removal in eye  . GXT  04/2017   Exercise tolerance test off of beta-blockers: Normal blood pressure response to exercise.  No EKG changes to suggest ischemic changes.  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  Seen at rest, exercise and recovery.  Marland Kitchen NM MYOVIEW LTD  08/2018   LOW RISK. No ischemia or infarction  . PVC ABLATION N/A 08/19/2019   Procedure: PVC ABLATION;  Surgeon: Constance Haw, MD;  Location: [facility];  Service: Cardiovascular;  Laterality: N/A;  . TRANSTHORACIC ECHOCARDIOGRAM  08/2018   EF 55-60%.  No RWMA. Gr 2 DD. Mod AI.  Marland Kitchen TUBAL LIGATION  1978     Current Outpatient Medications  Medication Sig Dispense Refill  . aspirin 81 MG tablet Take 81 mg  by mouth every evening.     Marland Kitchen atenolol (TENORMIN) 50 MG tablet Take 1 tablet (50 mg total) by mouth 2 (two) times daily. 180 tablet 1  . atorvastatin (LIPITOR) 20 MG tablet Take 10 mg by mouth every other day. Pt takes 10 mg daily     . carboxymethylcellulose (REFRESH TEARS) 0.5 % SOLN Place 1 drop into both eyes 3 (three) times daily as needed (dry eye).     . Cholecalciferol (VITAMIN D3) 50 MCG (2000 UT) TABS Take 2,000 Units by mouth daily.     . ciprofloxacin (CIPRO) 250 MG tablet Take 1  tablet (250 mg total) by mouth 2 (two) times daily. 10 tablet 0  . dimenhyDRINATE (DRAMAMINE) 50 MG tablet Take 50 mg by mouth at bedtime. Must take with Lorazepam    . famotidine (PEPCID) 10 MG tablet Take 10 mg by mouth at bedtime as needed for heartburn or indigestion.     Marland Kitchen ibuprofen (ADVIL) 200 MG tablet Take 600 mg by mouth every 6 (six) hours as needed for headache.    Marland Kitchen LORazepam (ATIVAN) 1 MG tablet TAKE 1 TABLET BY MOUTH EVERYDAY AT BEDTIME 30 tablet 5  . Multiple Vitamins-Minerals (PRESERVISION AREDS 2) CAPS Take 1 capsule by mouth 2 (two) times daily.    . phenazopyridine (PYRIDIUM) 100 MG tablet Take 1 tablet (100 mg total) by mouth 3 (three) times daily as needed for pain. 10 tablet 0  . ramipril (ALTACE) 10 MG capsule TAKE 2 CAPSULES BY MOUTH DAILY 180 capsule 3  . sodium chloride (OCEAN) 0.65 % SOLN nasal spray Place 1 spray into both nostrils as needed for congestion.     No current facility-administered medications for this visit.    Allergies:   Hctz [hydrochlorothiazide], Amlodipine, Crestor [rosuvastatin calcium], Erythromycin, Macrobid [nitrofurantoin monohyd macro], Niacin and related, Restoril, and Zolpidem tartrate   Social History:  The patient  reports that she has never smoked. She has never used smokeless tobacco. She reports that she does not drink alcohol and does not use drugs.   Family History:  The patient's family history includes Breast cancer (age of onset: 87) in her paternal aunt; Heart attack in her father; Heart disease in her father; Hypertension in her brother and sister; Pancreatic cancer in her father; Stroke in her maternal aunt.   ROS:  Please see the history of present illness.   Otherwise, review of systems is positive for none.   All other systems are reviewed and negative.   PHYSICAL EXAM: VS:  BP (!) 178/80   Pulse 69   Ht 5' 5.5" (1.664 m)   Wt 152 lb 9.6 oz (69.2 kg)   SpO2 98%   BMI 25.01 kg/m  , BMI Body mass index is 25.01  kg/m. GEN: Well nourished, well developed, in no acute distress  HEENT: normal  Neck: no JVD, carotid bruits, or masses Cardiac: RRR; no murmurs, rubs, or gallops,no edema  Respiratory:  clear to auscultation bilaterally, normal work of breathing GI: soft, nontender, nondistended, + BS MS: no deformity or atrophy  Skin: warm and dry Neuro:  Strength and sensation are intact Psych: euthymic mood, full affect  EKG:  EKG is ordered today. Personal review of the ekg ordered shows sinus rhythm, left bundle branch block, rate 69  Recent Labs: 11/16/2019: ALT 19; Hemoglobin 12.8; Platelets 189.0; TSH 3.43 01/20/2020: BUN 11; Creatinine, Ser 0.86; Potassium 4.6; Sodium 131    Lipid Panel     Component Value Date/Time  CHOL 201 (H) 05/26/2019 0735   TRIG 145.0 05/26/2019 0735   HDL 41.40 05/26/2019 0735   CHOLHDL 5 05/26/2019 0735   VLDL 29.0 05/26/2019 0735   LDLCALC 130 (H) 05/26/2019 0735     Wt Readings from Last 3 Encounters:  04/14/20 152 lb 9.6 oz (69.2 kg)  01/13/20 151 lb 3.2 oz (68.6 kg)  12/23/19 152 lb (68.9 kg)      Other studies Reviewed: Additional studies/ records that were reviewed today include: TTE 09/02/18  Review of the above records today demonstrates:   - Left ventricle: The cavity size was normal. Systolic function was   normal. The estimated ejection fraction was in the range of 55%   to 60%. Wall motion was normal; there were no regional wall   motion abnormalities. Features are consistent with a pseudonormal   left ventricular filling pattern, with concomitant abnormal   relaxation and increased filling pressure (grade 2 diastolic   dysfunction). - Aortic valve: There was moderate regurgitation. Regurgitation   pressure half-time: 422 ms. - Mitral valve: There was trivial regurgitation. - Right ventricle: Systolic function was normal. - Atrial septum: No defect or patent foramen ovale was identified. - Tricuspid valve: There was trivial  regurgitation. - Pulmonic valve: There was no significant regurgitation.  SPECT 09/11/18  There was no ST segment deviation noted during stress.  The study is normal.  This is a low risk study with no evidence of perfusion abnormalities.  This study was not gated due to frequent ventricular ectopy.  Monitor 08/26/18 - personally reviewed  Predominantly sinus rhythm with first-degree block. Minimum heart rate 63 bpm. Maximum heart rate 139 bpm. Average is 85 bpm.  Very rare PACs noted. (<1%)  Frequent isolated PVCs (17.5%);  Ventricular trigeminy and bigeminy noted on diary.  No ventricular tachycardia or other arrhythmias. No pauses or bradycardia.   Significant amount of ventricular ectopy noted, but no arrhythmia.  ASSESSMENT AND PLAN:  1.  PVCs: Did not tolerate flecainide or mexiletine.  Status post PVC ablation 08/19/2019.  No further episodes.  No changes.     2.  Hypertension: Blood pressure significantly elevated in clinic today.  She brings in blood pressure recordings that are consistently less than 130/80.  No changes.  3.  Left bundle branch block: Has developed since December 2020.  I do not think that this is a complication of ablation, only conduction system disease.  We [facility] continue to monitor.  Case discussed with primary cardiology  Current medicines are reviewed at length with the patient today.   The patient does not have concerns regarding her medicines.  The following changes were made today: None  Labs/ tests ordered today include:  Orders Placed This Encounter  Procedures  . EKG 12-Lead    Disposition:   FU with Arlo Buffone 6 months  Signed, Shaydon Lease Meredith Leeds, MD  04/14/2020 8:34 AM     [facility] [address] [place] [address] [phone] (office) [phone] (fax)

## 2020-05-02 ENCOUNTER — Ambulatory Visit
Admission: RE | Admit: 2020-05-02 | Discharge: 2020-05-02 | Disposition: A | Discharge status: Home / Self Care | Payer: PPO | Source: Ambulatory Visit | Attending: Obstetrics and Gynecology | Admitting: Obstetrics and Gynecology

## 2020-05-02 ENCOUNTER — Other Ambulatory Visit: Payer: Self-pay

## 2020-05-02 DIAGNOSIS — Z1231 Encounter for screening mammogram for malignant neoplasm of breast: Secondary | ICD-10-CM | POA: Diagnosis not present

## 2020-05-12 DIAGNOSIS — Z01419 Encounter for gynecological examination (general) (routine) without abnormal findings: Secondary | ICD-10-CM | POA: Diagnosis not present

## 2020-05-12 DIAGNOSIS — Z6824 Body mass index (BMI) 24.0-24.9, adult: Secondary | ICD-10-CM | POA: Diagnosis not present

## 2020-05-26 ENCOUNTER — Other Ambulatory Visit: Payer: Self-pay | Admitting: Internal Medicine

## 2020-05-26 NOTE — Telephone Encounter (Signed)
04/26/2020 Lorazepam 1 Mg Tablet 30#  Last ov 01/13/20 Next ov n/s

## 2020-06-09 ENCOUNTER — Other Ambulatory Visit: Payer: Self-pay | Admitting: Cardiology

## 2020-06-17 ENCOUNTER — Telehealth: Payer: Self-pay | Admitting: Internal Medicine

## 2020-06-17 NOTE — Progress Notes (Signed)
  Chronic Care Management   Outreach Note  06/17/2020 Name: GISSELA BLOCH MRN: [id number] DOB: Sep 12, 1946  Referred by: Hoyt Koch, MD Reason for referral : No chief complaint on file.   An unsuccessful telephone outreach was attempted today. The patient was referred to the pharmacist for assistance with care management and care coordination.   Follow Up Plan:   Earney Hamburg Upstream Scheduler

## 2020-06-22 ENCOUNTER — Telehealth: Payer: Self-pay | Admitting: Internal Medicine

## 2020-06-22 ENCOUNTER — Telehealth: Payer: Self-pay | Admitting: Cardiology

## 2020-06-22 ENCOUNTER — Ambulatory Visit: Payer: PPO | Admitting: Cardiology

## 2020-06-22 ENCOUNTER — Other Ambulatory Visit: Payer: Self-pay

## 2020-06-22 VITALS — BP 124/68 | HR 76 | Ht 65.5 in | Wt 152.0 lb

## 2020-06-22 DIAGNOSIS — E7800 Pure hypercholesterolemia, unspecified: Secondary | ICD-10-CM

## 2020-06-22 DIAGNOSIS — I351 Nonrheumatic aortic (valve) insufficiency: Secondary | ICD-10-CM | POA: Diagnosis not present

## 2020-06-22 DIAGNOSIS — I119 Hypertensive heart disease without heart failure: Secondary | ICD-10-CM | POA: Diagnosis not present

## 2020-06-22 DIAGNOSIS — R0600 Dyspnea, unspecified: Secondary | ICD-10-CM | POA: Diagnosis not present

## 2020-06-22 DIAGNOSIS — I493 Ventricular premature depolarization: Secondary | ICD-10-CM

## 2020-06-22 DIAGNOSIS — R0609 Other forms of dyspnea: Secondary | ICD-10-CM | POA: Insufficient documentation

## 2020-06-22 DIAGNOSIS — I447 Left bundle-branch block, unspecified: Secondary | ICD-10-CM | POA: Diagnosis not present

## 2020-06-22 DIAGNOSIS — Z79899 Other long term (current) drug therapy: Secondary | ICD-10-CM

## 2020-06-22 NOTE — Progress Notes (Signed)
PCP: Hoyt Koch, MD  Cardiologist: Glenetta Hew, MD EP: Dr. Curt Bears   CLINIC NOTE   Chief Complaint  Patient presents with  . Follow-up  . Chest Pain    Chest burning and throat tightness with walking.    HISTORY OF PRESENT ILLNESS   Lisa Cox is a 74 y.o. female who is being seen today for six-month follow-up evaluation of frequent RVOT PVCs along with aortic insufficiency. Lisa Cox is a former patient of Dr. Warren Danes whom he last saw on 12/19/2015.   Lisa Cox was seen by Dr. Curt Bears on 07/09/2019 in person then follow-up by telemedicine in October- discussed (RVOT) PVC ablation -scheduled for August 19, 2019.  Noted mild exertional dyspnea but mostly noted her symptomatic PVCs.  Recent Hospitalizations:   October 28-PVC ablation  Seen by Dr. Curt Bears on April 14 2020.  Follow-up for PVC ablation October 2020  Blood pressure significant elevated.  Home BP readings were much better than that.  No changes made.  December 23, 2019 -> follow-up with me; doing much better after PVC ablation.  Has had recurrent UTIs.  PCP had increased atorvastatin to 20 mg daily.  Was also concerned about elevated blood pressures-patient was concerned about lower blood pressures causing dizziness and lightheadedness.  Started on HCTZ 25 mg daily (discontinued due to hyponatremia)  April 14, 2020-follow-up with Dr. Curt Bears (BP 178/80 mmHg--however home records consistently 130/80 mmHg.)  Noted complete left bundle branch block developed since December 2020.  Not to be related to ablation.  More related to conduction disease.  Reviewed CV studies   Studies Personally Reviewed - (if available, images/films reviewed: From Epic Chart or Care Everywhere)  No new studies   Lisa Cox returns back for general cardiology follow-up stating that still feels minimal palpitations and doing much better.  These are pretty rare.  She has rare chest burning  sensation, but unfortunately is not really walking very much.  She feels that she tires quickly, and has to stop about halfway through her walks.  Part of the reason is that she does not really do very well walking outside due to the heat.  She has okay if she walks in stores, but has not been doing that much with COVID-19.  She does mention however though sometimes she has a tightness in her throat associated with exertional dyspnea.  This is a relatively new finding for her.  She notes mild end of day edema.  She continues to have relatively well-controlled blood pressures at home similar to what she has here today.  Cardiovascular review of symptoms: positive for - chest pain, dyspnea on exertion and exertional dyspnea if she got tries to something quickly or walks for prolonged distance-associated with throat tightness negative for - edema, irregular heartbeat, orthopnea, palpitations, paroxysmal nocturnal dyspnea, rapid heart rate, shortness of breath or syncope/ near syncope, TIA/amarusosi fugax, claudication  She did not start taking full 20 mg atorvastatin.  Is taking 1/2 tablet daily every other day.  REVIEW OF SYMPTOMS    ROS: A comprehensive was performed. Review of Systems  Constitutional: Negative for malaise/fatigue.  HENT: Negative for congestion and nosebleeds.   Respiratory: Positive for shortness of breath (Worse with exertion).   Cardiovascular: Positive for chest pain (Chest burning and tightness in throat with walking.).       Per history of present illness  Gastrointestinal: Negative for abdominal pain, blood in stool, heartburn, melena and nausea.  Genitourinary:  Negative for hematuria.  Musculoskeletal: Positive for joint pain. Negative for myalgias.  Neurological: Negative for dizziness.  Endo/Heme/Allergies: Negative for environmental allergies.  Psychiatric/Behavioral: The patient is nervous/anxious.    I have reviewed and (if needed) personally updated the  patient's problem list, medications, allergies, past medical and surgical history, social and family history.    PAST MEDICAL/SURGICAL HISTORY    Past Medical History:  Diagnosis Date  . Abdominal pain, epigastric 11/12/2019  . Acute cystitis with hematuria 10/29/2019  . Anxiety   . Bell's palsy 2009  . Benign hypertensive heart disease without heart failure 07/11/2011  . Cancer ([place]) Feb.2011   breast,lumpectomy right side followed by radiation  . Depression   . Dysuria 10/29/2019  . Elevated lipase 11/18/2019  . Fatigue 05/26/2019  . Frequent unifocal PVCs 04/02/2017   She was post of had an echocardiogram done which I don't see having been done. At this point, I think with her not having too many symptoms of PVCs we can watch for now and then maybe oracular follow-up.  Marland Kitchen GERD (gastroesophageal reflux disease)   . Hypercholesterolemia 07/11/2011  . Hyperlipidemia   . Hypertension   . Hyponatremia 11/18/2019  . Insomnia 04/10/2016  . Nausea 11/12/2019  . Palpitations    Frequent PVCs with some bigeminy noted on 48-hour monitor. No arrhythmias.  . Personal history of radiation therapy   . PVC (premature ventricular contraction) 10/31/2018  . Routine general medical examination at a health care facility 07/12/2017  . UTI symptoms 10/02/2019  . Ventricular bigeminy seen on cardiac monitor 05/26/2016   Immunization History  Administered Date(s) Administered  . Fluad Quad(high Dose 65+) 07/01/2019  . Influenza, High Dose Seasonal PF 07/12/2017, 08/05/2018  . Influenza,inj,Quad PF,6+ Mos 07/30/2016  . Influenza-Unspecified 08/22/2013  . Pneumococcal Polysaccharide-23 07/12/2017     Past Surgical History:  Procedure Laterality Date  . BREAST LUMPECTOMY Right Feb. 2011   right breast,followed by radiation therapy  . CARDIAC EVENT MONITOR     Predominantly sinus rhythm with first-degree block. Minimum heart rate 63 bpm. Maximum heart rate 139 bpm. Average is 85 bpm. 17.5% PVCs.  V bigem &  Trigem noted.    Marland Kitchen EYE SURGERY  1953   skin removal in eye  . GXT  04/2017   Exercise tolerance test off of beta-blockers: Normal blood pressure response to exercise.  No EKG changes to suggest ischemic changes.  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  Seen at rest, exercise and recovery.  Marland Kitchen NM MYOVIEW LTD  08/2018   LOW RISK. No ischemia or infarction  . PVC ABLATION N/A 08/19/2019   Procedure: PVC ABLATION;  Surgeon: Constance Haw, MD;  Location: [facility];  Service: Cardiovascular;  Laterality: N/A;  . TRANSTHORACIC ECHOCARDIOGRAM  08/2018   EF 55-60%.  No RWMA. Gr 2 DD. Mod AI.  Marland Kitchen TUBAL LIGATION  1978    Cardiac Event Monitor October 2019: Predominantly sinus rhythm with first-degree block. Minimum heart rate 63 bpm. Maximum heart rate 139 bpm. Average is 85 bpm. 17.5% PVCs.  V bigem & Trigem noted.     MEDICATIONS / ALLERGIES    Current Meds  Medication Sig  . aspirin 81 MG tablet Take 81 mg by mouth every evening.   Marland Kitchen atenolol (TENORMIN) 50 MG tablet TAKE 1 TABLET BY MOUTH TWICE A DAY  . atorvastatin (LIPITOR) 20 MG tablet Take 10 mg by mouth every other day. Pt takes 10 mg daily   . carboxymethylcellulose (REFRESH  TEARS) 0.5 % SOLN Place 1 drop into both eyes 3 (three) times daily as needed (dry eye).   . Cholecalciferol (VITAMIN D3) 50 MCG (2000 UT) TABS Take 2,000 Units by mouth daily.   Marland Kitchen dimenhyDRINATE (DRAMAMINE) 50 MG tablet Take 50 mg by mouth at bedtime. Must take with Lorazepam  . famotidine (PEPCID) 10 MG tablet Take 10 mg by mouth at bedtime as needed for heartburn or indigestion.   Marland Kitchen ibuprofen (ADVIL) 200 MG tablet Take 600 mg by mouth every 6 (six) hours as needed for headache.  Marland Kitchen LORazepam (ATIVAN) 1 MG tablet TAKE 1 TABLET BY MOUTH EVERYDAY AT BEDTIME  . Multiple Vitamins-Minerals (PRESERVISION AREDS 2) CAPS Take 1 capsule by mouth 2 (two) times daily.  . phenazopyridine (PYRIDIUM) 100 MG tablet Take 1 tablet (100 mg total) by mouth 3  (three) times daily as needed for pain.  . ramipril (ALTACE) 10 MG capsule TAKE 2 CAPSULES BY MOUTH DAILY  . sodium chloride (OCEAN) 0.65 % SOLN nasal spray Place 1 spray into both nostrils as needed for congestion.  . [DISCONTINUED] ciprofloxacin (CIPRO) 250 MG tablet Take 1 tablet (250 mg total) by mouth 2 (two) times daily.  - stopped Bactrim after UTI  Allergies  Allergen Reactions  . Hctz [Hydrochlorothiazide] Other (See Comments)    Patient developed significant hyponatremia shortly after starting.  Would also include chlorthalidone  . Amlodipine Other (See Comments)    Peripheral edema  . Crestor [Rosuvastatin Calcium] Other (See Comments)    Leg cramps  . Erythromycin Nausea Only  . Macrobid [Nitrofurantoin Monohyd Macro] Nausea And Vomiting  . Niacin And Related Itching  . Restoril Other (See Comments)    Reaction-"nervous"  . Zolpidem Tartrate     Reaction-"nervous"    SOCIAL HISTORY/FAMILY HISTORY   Social History   Tobacco Use  . Smoking status: Never Smoker  . Smokeless tobacco: Never Used  Substance Use Topics  . Alcohol use: No  . Drug use: No   Social History   Social History Narrative  . Not on file   Family History family history includes Breast cancer (age of onset: 52) in her paternal aunt; Heart attack in her father; Heart disease in her father; Hypertension in her brother and sister; Pancreatic cancer in her father; Stroke in her maternal aunt.   OBJCTIVE -PE, EKG, labs   Wt Readings from Last 3 Encounters:  06/22/20 152 lb (68.9 kg)  04/14/20 152 lb 9.6 oz (69.2 kg)  01/13/20 151 lb 3.2 oz (68.6 kg)    PHYSICAL EXAM BP 124/68   Pulse 76   Ht 5' 5.5" (1.664 m)   Wt 152 lb (68.9 kg)   SpO2 96%   BMI 24.91 kg/m  Previous evaluation on August 19 showed blood pressure 117/65 and she is also recorded 134/79 mmHg. Physical Exam Vitals and nursing note reviewed.  Constitutional:      General: She is not in acute distress.    Appearance:  Normal appearance. She is well-developed. She is not ill-appearing.     Comments: Well-groomed, healthy-appearing  HENT:     Head: Normocephalic and atraumatic.  Neck:     Vascular: No carotid bruit, hepatojugular reflux or JVD.  Cardiovascular:     Rate and Rhythm: Normal rate and regular rhythm.  No extrasystoles are present.    Chest Wall: PMI is not displaced.     Pulses: Normal pulses.     Heart sounds: Normal heart sounds. No murmur heard.  No friction  rub. No gallop.   Pulmonary:     Effort: No respiratory distress.     Breath sounds: Normal breath sounds. No wheezing or rales.  Musculoskeletal:        General: No swelling. Normal range of motion.     Cervical back: Normal range of motion and neck supple.  Neurological:     General: No focal deficit present.     Mental Status: She is alert and oriented to person, place, and time.     Cranial Nerves: No cranial nerve deficit.  Psychiatric:        Mood and Affect: Mood normal.        Behavior: Behavior normal.        Thought Content: Thought content normal.        Judgment: Judgment normal.     Adult ECG Report n/a  Other studies Reviewed: Additional studies/ records that were reviewed today include:  Recent Labs:  Managed by PCP Lab Results  Component Value Date   CREATININE 0.95 06/23/2020   BUN 13 06/23/2020   NA 136 06/23/2020   K 4.4 06/23/2020   CL 97 06/23/2020   CO2 26 06/23/2020   Lab Results  Component Value Date   CHOL 158 06/23/2020   HDL 45 06/23/2020   LDLCALC 90 06/23/2020   TRIG 126 06/23/2020   CHOLHDL 3.5 06/23/2020    ASSESSMENT AND PLAN   Problem List Items Addressed This Visit    Frequent unifocal PVCs (Chronic)    Pretty much stable following ablation.  Remains on atenolol 50 mg twice daily.      Hypercholesterolemia (Chronic)    Just had lipids done.  Cholesterol now is much better.  LDL 90.  She has not been taking her statin every day.  Recommended that she either take 10 mg  every day or 20 mg every other day.      Benign hypertensive heart disease without heart failure (Chronic)    Euvolemic on exam.  Did not tolerate HCTZ because of hypokalemia.  Blood pressures actually seem much better today on current dose of atorvastatin and ramipril.  Continue to monitor.      Complete left bundle branch block (LBBB) (Chronic)    New finding, Dr. Curt Bears felt like this is probably related to conduction disease.  However with her now having symptoms concerning with exertional dyspnea and chest burning/throat tightness, is potential that this could be anginal in nature. Continue to monitor, for now we will simply evaluate with 2D echo.  If there is anything grossly abnormal, would consider coronary CTA.       Aortic regurgitation (Chronic)    History of moderate aortic insufficiency.  With her now having exertional dyspnea with throat tightness, will check follow-up 2D echo now.      Relevant Orders   ECHOCARDIOGRAM COMPLETE   DOE (dyspnea on exertion) - with throat tightness - Primary    Check 2D echo.  If abnormal consider coronary CTA      Relevant Orders   ECHOCARDIOGRAM COMPLETE     The patient does not have symptoms concerning for COVID-19 infection (fever, chills, cough, or new shortness of breath).  The patient is practicing social distancing.   COVID-19 Education: The signs and symptoms of COVID-19 were discussed with the patient and how to seek care for testing (follow up with PCP or arrange E-visit).   The importance of social distancing was discussed today.  I spent a total of 22 minutes with the  patient in direct patient consultation.  Additional time spent with chart review  / charting (studies, outside notes, etc): 10 Total Time: 52min  Current medicines are reviewed at length with the patient today. (+/- concerns) concerned about statin dosing The following changes have been made:Recommended either taking 20 mg every other day statin or 10  mg daily.   Patient Instructions / Medication Changes & Studies & Tests Ordered   Patient Instructions  Medication Instructions:  Continue current medications  *If you need a refill on your cardiac medications before your next appointment, please call your pharmacy*   Lab Work: None Ordered   Testing/Procedures: Your physician has requested that you have an echocardiogram. Echocardiography is a painless test that uses sound waves to create images of your heart. It provides your doctor with information about the size and shape of your heart and how well your heart's chambers and valves are working. This procedure takes approximately one hour. There are no restrictions for this procedure.   Follow-Up: At [facility], you and your health needs are our priority.  As part of our continuing mission to provide you with exceptional heart care, we have created designated Provider Care Teams.  These Care Teams include your primary Cardiologist (physician) and Advanced Practice Providers (APPs -  Physician Assistants and Nurse Practitioners) who all work together to provide you with the care you need, when you need it.  We recommend signing up for the patient portal called "MyChart".  Sign up information is provided on this After Visit Summary.  MyChart is used to connect with patients for Virtual Visits (Telemedicine).  Patients are able to view lab/test results, encounter notes, upcoming appointments, etc.  Non-urgent messages can be sent to your provider as well.   To learn more about what you can do with MyChart, go to [email or web address].    Your next appointment:   6 month(s)  The format for your next appointment:   In Person  Provider:   You may see Glenetta Hew, MD or one of the following Advanced Practice Providers on your designated Care Team:    Rosaria Ferries, PA-C  Jory Sims, DNP, ANP  Cadence Kathlen Mody, PA-C    Other Instructions Take Atorvastatin 20 mg every  other day or Atorvastatin 1/ tablet(10 mg) every day, which one works best for you     Studies Ordered:   Orders Placed This Encounter  Procedures  . ECHOCARDIOGRAM COMPLETE      Glenetta Hew, M.D., M.S. Interventional Cardiologist   Pager # [phone] Phone # [phone] [address] [place], [place] [address]

## 2020-06-22 NOTE — Telephone Encounter (Signed)
Left message to call back  

## 2020-06-22 NOTE — Patient Instructions (Signed)
Medication Instructions:  Continue current medications  *If you need a refill on your cardiac medications before your next appointment, please call your pharmacy*   Lab Work: None Ordered   Testing/Procedures: Your physician has requested that you have an echocardiogram. Echocardiography is a painless test that uses sound waves to create images of your heart. It provides your doctor with information about the size and shape of your heart and how well your heart's chambers and valves are working. This procedure takes approximately one hour. There are no restrictions for this procedure.   Follow-Up: At [facility], you and your health needs are our priority.  As part of our continuing mission to provide you with exceptional heart care, we have created designated Provider Care Teams.  These Care Teams include your primary Cardiologist (physician) and Advanced Practice Providers (APPs -  Physician Assistants and Nurse Practitioners) who all work together to provide you with the care you need, when you need it.  We recommend signing up for the patient portal called "MyChart".  Sign up information is provided on this After Visit Summary.  MyChart is used to connect with patients for Virtual Visits (Telemedicine).  Patients are able to view lab/test results, encounter notes, upcoming appointments, etc.  Non-urgent messages can be sent to your provider as well.   To learn more about what you can do with MyChart, go to [email or web address].    Your next appointment:   6 month(s)  The format for your next appointment:   In Person  Provider:   You may see Glenetta Hew, MD or one of the following Advanced Practice Providers on your designated Care Team:    Rosaria Ferries, PA-C  Jory Sims, DNP, ANP  Cadence Kathlen Mody, PA-C    Other Instructions Take Atorvastatin 20 mg every other day or Atorvastatin 1/ tablet(10 mg) every day, which one works best for you

## 2020-06-22 NOTE — Progress Notes (Signed)
  Chronic Care Management   Outreach Note  06/22/2020 Name: Lisa Cox MRN: [id number] DOB: 12-27-1945  Referred by: Hoyt Koch, MD Reason for referral : No chief complaint on file.   An unsuccessful telephone outreach was attempted today. The patient was referred to the pharmacist for assistance with care management and care coordination.   Follow Up Plan:   Earney Hamburg [employer] Scheduler

## 2020-06-22 NOTE — Telephone Encounter (Signed)
Spoke with patient and PCP out on leaved Ordered CMET/LP panel, patient will come soon for labs

## 2020-06-22 NOTE — Telephone Encounter (Signed)
Patient is calling to follow up regarding lab work, discussed during appointment today. She states Dr. Ellyn Hack advised that she have lab work with her PCP. However, per patient, her PCP (Dr. Sharlet Salina) is out on a leave and she would prefer to have lipid lab work completed at our office.

## 2020-06-23 DIAGNOSIS — E7800 Pure hypercholesterolemia, unspecified: Secondary | ICD-10-CM | POA: Diagnosis not present

## 2020-06-23 DIAGNOSIS — Z79899 Other long term (current) drug therapy: Secondary | ICD-10-CM | POA: Diagnosis not present

## 2020-06-23 LAB — LIPID PANEL
Chol/HDL Ratio: 3.5 ratio (ref 0.0–4.4)
Cholesterol, Total: 158 mg/dL (ref 100–199)
HDL: 45 mg/dL (ref >39)
LDL Chol Calc (NIH): 90 mg/dL (ref 0–99)
Triglycerides: 126 mg/dL (ref 0–149)
VLDL Cholesterol Cal: 23 mg/dL (ref 5–40)

## 2020-06-23 LAB — COMPREHENSIVE METABOLIC PANEL
ALT: 17 IU/L (ref 0–32)
AST: 19 IU/L (ref 0–40)
Albumin/Globulin Ratio: 1.6 (ref 1.2–2.2)
Albumin: 4.1 g/dL (ref 3.7–4.7)
Alkaline Phosphatase: 91 IU/L (ref 48–121)
BUN/Creatinine Ratio: 14 (ref 12–28)
BUN: 13 mg/dL (ref 8–27)
Bilirubin Total: 0.4 mg/dL (ref 0.0–1.2)
CO2: 26 mmol/L (ref 20–29)
Calcium: 9.2 mg/dL (ref 8.7–10.3)
Chloride: 97 mmol/L (ref 96–106)
Creatinine, Ser: 0.95 mg/dL (ref 0.57–1.00)
GFR calc Af Amer: 67 mL/min/{1.73_m2} (ref >59)
GFR calc non Af Amer: 58 mL/min/{1.73_m2} — ABNORMAL LOW (ref >59)
Globulin, Total: 2.5 g/dL (ref 1.5–4.5)
Glucose: 100 mg/dL — ABNORMAL HIGH (ref 65–99)
Potassium: 4.4 mmol/L (ref 3.5–5.2)
Sodium: 136 mmol/L (ref 134–144)
Total Protein: 6.6 g/dL (ref 6.0–8.5)

## 2020-06-27 ENCOUNTER — Encounter: Payer: Self-pay | Admitting: Cardiology

## 2020-06-27 NOTE — Assessment & Plan Note (Signed)
Euvolemic on exam.  Did not tolerate HCTZ because of hypokalemia.  Blood pressures actually seem much better today on current dose of atorvastatin and ramipril.  Continue to monitor.

## 2020-06-27 NOTE — Assessment & Plan Note (Deleted)
Pretty much stable following ablation.  Remains on atenolol 50 mg twice daily.

## 2020-06-27 NOTE — Assessment & Plan Note (Signed)
Pretty much stable following ablation.  Remains on atenolol 50 mg twice daily.

## 2020-06-27 NOTE — Assessment & Plan Note (Signed)
New finding, Dr. Curt Bears felt like this is probably related to conduction disease.  However with her now having symptoms concerning with exertional dyspnea and chest burning/throat tightness, is potential that this could be anginal in nature. Continue to monitor, for now we will simply evaluate with 2D echo.  If there is anything grossly abnormal, would consider coronary CTA.

## 2020-06-27 NOTE — Assessment & Plan Note (Signed)
Check 2D echo.  If abnormal consider coronary CTA

## 2020-06-27 NOTE — Assessment & Plan Note (Signed)
History of moderate aortic insufficiency.  With her now having exertional dyspnea with throat tightness, will check follow-up 2D echo now.

## 2020-06-27 NOTE — Assessment & Plan Note (Signed)
Just had lipids done.  Cholesterol now is much better.  LDL 90.  She has not been taking her statin every day.  Recommended that she either take 10 mg every day or 20 mg every other day.

## 2020-06-30 ENCOUNTER — Telehealth: Payer: Self-pay | Admitting: Internal Medicine

## 2020-06-30 NOTE — Progress Notes (Signed)
  Chronic Care Management   Outreach Note  06/30/2020 Name: Lisa Cox MRN: [id number] DOB: [birth year]  Referred by: Hoyt Koch, MD Reason for referral : No chief complaint on file.   Third unsuccessful telephone outreach was attempted today. The patient was referred to the pharmacist for assistance with care management and care coordination.   Follow Up Plan:   Carley Perdue [employer] Scheduler

## 2020-07-11 ENCOUNTER — Ambulatory Visit (HOSPITAL_COMMUNITY): Payer: PPO | Attending: Cardiology

## 2020-07-11 ENCOUNTER — Other Ambulatory Visit: Payer: Self-pay

## 2020-07-11 DIAGNOSIS — R0600 Dyspnea, unspecified: Secondary | ICD-10-CM | POA: Diagnosis not present

## 2020-07-11 DIAGNOSIS — I351 Nonrheumatic aortic (valve) insufficiency: Secondary | ICD-10-CM | POA: Diagnosis not present

## 2020-07-11 DIAGNOSIS — R0609 Other forms of dyspnea: Secondary | ICD-10-CM

## 2020-07-11 HISTORY — PX: TRANSTHORACIC ECHOCARDIOGRAM: SHX275

## 2020-07-11 LAB — ECHOCARDIOGRAM COMPLETE
Area-P 1/2: 2.87 cm2
P 1/2 time: 360 msec
S' Lateral: 2.8 cm

## 2020-07-19 ENCOUNTER — Telehealth: Payer: Self-pay | Admitting: Cardiology

## 2020-07-19 NOTE — Telephone Encounter (Signed)
Copied MD

## 2020-07-19 NOTE — Telephone Encounter (Signed)
Patient is requesting to speak with Dr. Macky Lower nurse specifically to discuss the [place] COVID-19 booster shot and pericarditis side effects. Please call.

## 2020-07-19 NOTE — Telephone Encounter (Signed)
Pt concerned if she should get the booster vaccine d/t "pericarditis on my echocardiogram in September".    (small pericardial effusion noted on 9/20 echo)  States concern is that she isn't sure if 2nd Covid vaccine (J&J) is what caused the effusion in the first place, and is it too risky to get the booster. Aware that I will forward to Dr. Ellyn Hack and his nurse to address question/concern.  Aware they will follow up with MD advisement. Pt agreeable to plan.

## 2020-07-20 NOTE — Telephone Encounter (Signed)
Small pericardial effusion is not pericarditis. I would not consider this a reason to not get the Covid vaccine.   If she got the J&J vaccine, that was only 1 shot.  I am not certain of what the recommendations for booster shots on [employer] vaccine are at this time. However, I do not think that there should be a concern of cardiac toxicity.   Glenetta Hew, MD

## 2020-07-22 ENCOUNTER — Other Ambulatory Visit: Payer: Self-pay

## 2020-07-22 ENCOUNTER — Ambulatory Visit (INDEPENDENT_AMBULATORY_CARE_PROVIDER_SITE_OTHER): Payer: PPO | Admitting: Family

## 2020-07-22 ENCOUNTER — Encounter: Payer: Self-pay | Admitting: Family

## 2020-07-22 VITALS — BP 165/90 | HR 73 | Temp 98.4°F | Ht 65.5 in | Wt 154.8 lb

## 2020-07-22 DIAGNOSIS — R399 Unspecified symptoms and signs involving the genitourinary system: Secondary | ICD-10-CM | POA: Diagnosis not present

## 2020-07-22 LAB — POCT URINALYSIS DIPSTICK
Bilirubin, UA: NEGATIVE
Blood, UA: NEGATIVE
Glucose, UA: NEGATIVE
Ketones, UA: NEGATIVE
Leukocytes, UA: NEGATIVE
Nitrite, UA: NEGATIVE
Protein, UA: NEGATIVE
Spec Grav, UA: 1.015 (ref 1.010–1.025)
Urobilinogen, UA: 0.2 E.U./dL
pH, UA: 6 (ref 5.0–8.0)

## 2020-07-22 MED ORDER — CIPROFLOXACIN HCL 250 MG PO TABS: 250.0000 mg | ORAL_TABLET | Freq: Two times a day (BID) | ORAL | 0 refills | Status: DC

## 2020-07-22 NOTE — Addendum Note (Signed)
Addended by: Cresenciano Lick on: 07/22/2020 03:36 PM   Modules accepted: Orders

## 2020-07-22 NOTE — Progress Notes (Signed)
Lisa Cox is a 74 y.o. female with the following history as recorded in [employer]:  Patient Active Problem List   Diagnosis Date Noted  . Complete left bundle branch block (LBBB) 06/22/2020  . DOE (dyspnea on exertion) - with throat tightness 06/22/2020  . Aortic regurgitation 06/22/2020  . Elevated lipase 11/18/2019  . Hyponatremia 11/18/2019  . GERD (gastroesophageal reflux disease) 11/12/2019  . Nausea 11/12/2019  . Abdominal pain, epigastric 11/12/2019  . Dysuria 10/29/2019  . Acute cystitis with hematuria 10/29/2019  . UTI symptoms 10/02/2019  . Fatigue 05/26/2019  . Routine general medical examination at a health care facility 07/12/2017  . Essential hypertension 07/12/2017  . Frequent unifocal PVCs 04/02/2017  . Ventricular bigeminy seen on cardiac monitor 05/26/2016  . Insomnia 04/10/2016  . History of breast cancer 09/29/2011  . Hypercholesterolemia 07/11/2011  . Benign hypertensive heart disease without heart failure 07/11/2011  . Palpitations     Current Outpatient Medications  Medication Sig Dispense Refill  . aspirin 81 MG tablet Take 81 mg by mouth every evening.     Marland Kitchen atenolol (TENORMIN) 50 MG tablet TAKE 1 TABLET BY MOUTH TWICE A DAY 180 tablet 3  . atorvastatin (LIPITOR) 20 MG tablet Take 10 mg by mouth every other day. Pt takes 10 mg daily     . carboxymethylcellulose (REFRESH TEARS) 0.5 % SOLN Place 1 drop into both eyes 3 (three) times daily as needed (dry eye).     . Cholecalciferol (VITAMIN D3) 50 MCG (2000 UT) TABS Take 2,000 Units by mouth daily.     Marland Kitchen dimenhyDRINATE (DRAMAMINE) 50 MG tablet Take 50 mg by mouth at bedtime. Must take with Lorazepam    . famotidine (PEPCID) 10 MG tablet Take 10 mg by mouth at bedtime as needed for heartburn or indigestion.     Marland Kitchen ibuprofen (ADVIL) 200 MG tablet Take 600 mg by mouth every 6 (six) hours as needed for headache.    Marland Kitchen LORazepam (ATIVAN) 1 MG tablet TAKE 1 TABLET BY MOUTH EVERYDAY AT BEDTIME 30 tablet 5  .  Multiple Vitamins-Minerals (PRESERVISION AREDS 2) CAPS Take 1 capsule by mouth 2 (two) times daily.    . phenazopyridine (PYRIDIUM) 100 MG tablet Take 1 tablet (100 mg total) by mouth 3 (three) times daily as needed for pain. 10 tablet 0  . ramipril (ALTACE) 10 MG capsule TAKE 2 CAPSULES BY MOUTH DAILY 180 capsule 3  . sodium chloride (OCEAN) 0.65 % SOLN nasal spray Place 1 spray into both nostrils as needed for congestion.    . ciprofloxacin (CIPRO) 250 MG tablet Take 1 tablet (250 mg total) by mouth 2 (two) times daily. 6 tablet 0   No current facility-administered medications for this visit.    Allergies: Hctz [hydrochlorothiazide], Amlodipine, Crestor [rosuvastatin calcium], Erythromycin, Macrobid [nitrofurantoin monohyd macro], Niacin and related, Restoril, and Zolpidem tartrate  Past Medical History:  Diagnosis Date  . Abdominal pain, epigastric 11/12/2019  . Acute cystitis with hematuria 10/29/2019  . Anxiety   . Bell's palsy 2009  . Benign hypertensive heart disease without heart failure 07/11/2011  . Cancer ([place]) Feb.2011   breast,lumpectomy right side followed by radiation  . Depression   . Dysuria 10/29/2019  . Elevated lipase 11/18/2019  . Fatigue 05/26/2019  . Frequent unifocal PVCs 04/02/2017   She was post of had an echocardiogram done which I don't see having been done. At this point, I think with her not having too many symptoms of PVCs we can watch for  now and then maybe oracular follow-up.  Marland Kitchen GERD (gastroesophageal reflux disease)   . Hypercholesterolemia 07/11/2011  . Hyperlipidemia   . Hypertension   . Hyponatremia 11/18/2019  . Insomnia 04/10/2016  . Nausea 11/12/2019  . Palpitations    Frequent PVCs with some bigeminy noted on 48-hour monitor. No arrhythmias.  . Personal history of radiation therapy   . PVC (premature ventricular contraction) 10/31/2018  . Routine general medical examination at a health care facility 07/12/2017  . UTI symptoms 10/02/2019  . Ventricular  bigeminy seen on cardiac monitor 05/26/2016    Past Surgical History:  Procedure Laterality Date  . BREAST LUMPECTOMY Right Feb. 2011   right breast,followed by radiation therapy  . CARDIAC EVENT MONITOR     Predominantly sinus rhythm with first-degree block. Minimum heart rate 63 bpm. Maximum heart rate 139 bpm. Average is 85 bpm. 17.5% PVCs.  V bigem & Trigem noted.    Marland Kitchen EYE SURGERY  1953   skin removal in eye  . GXT  04/2017   Exercise tolerance test off of beta-blockers: Normal blood pressure response to exercise.  No EKG changes to suggest ischemic changes.  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  Seen at rest, exercise and recovery.  Marland Kitchen NM MYOVIEW LTD  08/2018   LOW RISK. No ischemia or infarction  . PVC ABLATION N/A 08/19/2019   Procedure: PVC ABLATION;  Surgeon: Constance Haw, MD;  Location: [facility];  Service: Cardiovascular;  Laterality: N/A;  . TRANSTHORACIC ECHOCARDIOGRAM  08/2018   EF 55-60%.  No RWMA. Gr 2 DD. Mod AI.  Marland Kitchen TUBAL LIGATION  1978    Family History  Problem Relation Age of Onset  . Heart disease Father   . Heart attack Father   . Pancreatic cancer Father   . Hypertension Sister   . Hypertension Brother   . Stroke Maternal Aunt   . Breast cancer Paternal Aunt 51       x 2    Social History   Tobacco Use  . Smoking status: Never Smoker  . Smokeless tobacco: Never Used  Substance Use Topics  . Alcohol use: No    Subjective:  Presents with concerns for possible UTI- symptoms started last week; last Tuesday, she used Keflex 500 mg bid x 3 days and then used every 8 hours for 5 days; ( last dose was Saturday a week ago); notes she has actually been feeling good but wanted to get a culture done to ensure that infection has cleared;     Objective:  Vitals:   07/22/20 1339  BP: (!) 165/90  Pulse: 73  Temp: 98.4 F (36.9 C)  TempSrc: Oral  SpO2: 98%  Weight: 154 lb 12.8 oz (70.2 kg)  Height: 5' 5.5" (1.664 m)    General:  Well developed, well nourished, in no acute distress  Head: Normocephalic and atraumatic  Lungs: Respirations unlabored; clear to auscultation bilaterally without wheeze, rales, rhonchi  Vessels: Symmetric bilaterally  Neurologic: Alert and oriented; speech intact; face symmetrical; moves all extremities well; CNII-XII intact without focal deficit   Assessment:  1. UTI symptoms     Plan:  Appears to have resolved with home treatment; check urine culture; she is given Rx for Cipro 250 mg bid x 3 days- she will hold and fill only if symptoms worsen over the upcoming weekend; follow-up to be determined;  She will plan for COVID booster and flu shot at later date;  This visit occurred during  the SARS-CoV-2 public health emergency.  Safety protocols were in place, including screening questions prior to the visit, additional usage of staff PPE, and extensive cleaning of exam room while observing appropriate contact time as indicated for disinfecting solutions.     No follow-ups on file.  Orders Placed This Encounter  Procedures  . Urine Culture    Standing Status:   Future    Standing Expiration Date:   07/22/2021  . POCT urinalysis dipstick    Requested Prescriptions   Signed Prescriptions Disp Refills  . ciprofloxacin (CIPRO) 250 MG tablet 6 tablet 0    Sig: Take 1 tablet (250 mg total) by mouth 2 (two) times daily.

## 2020-07-23 LAB — URINE CULTURE

## 2020-07-25 ENCOUNTER — Telehealth: Payer: Self-pay | Admitting: Cardiology

## 2020-07-25 NOTE — Telephone Encounter (Signed)
Spoke with patient, patient notified of the following        Can probably wait till I see her in follow-up.  I think we may end up just checking a coronary CTA.  Glenetta Hew, MD     Patient verbalized understanding. Patient also stated her BP is now 172/75. Advised patient I would forward to Dr. Ellyn Hack to make him aware.

## 2020-07-25 NOTE — Telephone Encounter (Signed)
Patient called into office regarding having jaw pain. Patient stated she had seen Dr. Ellyn Hack on 9/1 and was having some throat pain then, but since then patient stated she has been having intermittent achy left sided Jaw pain. Patient states the pain comes and goes, patient recently had dentist appointment in which she stated she did not have any issues with her teeth. Patient denies, chest pain, dizziness, nausea, vomiting, palpitations or shortness of breath. Patient states she only has the jaw pain intermittently. Patient states her blood pressure has been elevated yesterday 150/60 with HR of 74, patients blood pressure right now she states is 185/85 with pulse of 67, but patient states she is nervous while on the phone which she thinks it making her blood pressure higher. Advised patient to sit and relax and check blood pressure after she has relaxed for a few minutes and to call back to office to let us know. Appointment made for 10/14 with Dr. Ellyn Hack in the office. Advised patient that if symptoms were to worsen or new symptoms appear then to proceed to ED for evaluation. Patient verbalized understanding. Advised patient I would forward this message to Dr. Ellyn Hack for advice and review.

## 2020-07-25 NOTE — Telephone Encounter (Signed)
Can probably wait till I see him in follow-up.  I think we may end up just checking a coronary CTA.  Glenetta Hew, MD

## 2020-07-25 NOTE — Telephone Encounter (Signed)
Intermittent readings of blood pressures in the 1 70-1 80 range are not dangerous for long-term.  I do not make long-term medication decisions based upon 1 or 2 days worth of readings.  However, if her pressures continue to be this elevated tomorrow I would like to start amlodipine 2.5 mg daily.   Glenetta Hew, MD

## 2020-07-25 NOTE — Telephone Encounter (Signed)
Attempted to call patient, left message for patient to call back to office.   

## 2020-07-25 NOTE — Telephone Encounter (Signed)
Follow up   Pt is returning call to [place] Call transferred to [place] [facility]

## 2020-07-25 NOTE — Telephone Encounter (Signed)
New Message  Pt is calling and says she has been experiencing some Off and on left Jaw pain for about 2-3 weeks.  She says she has had No chest pain and no SOB.  Pt saw the dentist, her teeth are okay.  Pt said she saw her PCP and her BP was a little elevated. (she did not have the reading available) She says she has not checked it today   Please call back

## 2020-07-25 NOTE — Telephone Encounter (Signed)
Left message to call back  

## 2020-07-26 NOTE — Telephone Encounter (Signed)
Patient called back into office to report blood pressure readings from today. Patients blood pressure today has ranged from 622/29-798/92 systolic. With HR in the 60's. Advised patient of Dr. Darcus Pester recommendation of starting the 2.5mg  amlodipine if her BP continues to be elevated. Patient states she has taken amlodipine in the past and it has caused swelling in her legs. Patient denies any other symptoms at this time. Advised patient to continue monitoring her blood pressure daily and to let us know if she develops any symptoms. Patient is scheduled to see Dr. Ellyn Hack on 10/14. Patient verbalized understanding. Will forward message to provider for review.

## 2020-07-26 NOTE — Telephone Encounter (Signed)
Attempted to call patient, left message for patient to call back to office.   

## 2020-07-27 MED ORDER — HYDRALAZINE HCL 50 MG PO TABS: ORAL_TABLET | ORAL | 6 refills | Status: DC

## 2020-07-27 NOTE — Telephone Encounter (Signed)
Patient calling to report B/P today 156/68.She is unable to take Amlodipine causes feet to swell.Dr.Harding advised to take Hydralazine 50 mg twice a day.She will start taking.She will monitor B/P daily and bring readings to appointment with Dr.Harding on 10/14 at 11:40 am.Stated she is still having jaw pain off and on for the past 1 month.Advised I will make Dr.Harding aware.

## 2020-07-27 NOTE — Telephone Encounter (Signed)
Patient calling with BP reading today: 156/68

## 2020-07-27 NOTE — Telephone Encounter (Signed)
The issue is, we do not have too many other options for blood pressure control that would not have the side effect.  Amlodipine is better blood pressure medication the most other options such as hydralazine.  I think the high blood pressure is something that is a short-term thing and may only require short-term management.  If she refuses to take amlodipine, then we can try hydralazine 50 mg twice daily\.    Glenetta Hew, MD

## 2020-07-27 NOTE — Addendum Note (Signed)
Addended by: Kathyrn Lass on: 07/27/2020 04:26 PM   Modules accepted: Orders

## 2020-08-04 ENCOUNTER — Encounter: Payer: Self-pay | Admitting: Cardiology

## 2020-08-04 ENCOUNTER — Ambulatory Visit: Payer: PPO | Admitting: Cardiology

## 2020-08-04 ENCOUNTER — Other Ambulatory Visit: Payer: Self-pay

## 2020-08-04 VITALS — BP 162/82 | HR 79 | Ht 65.5 in | Wt 155.6 lb

## 2020-08-04 DIAGNOSIS — I493 Ventricular premature depolarization: Secondary | ICD-10-CM

## 2020-08-04 DIAGNOSIS — I119 Hypertensive heart disease without heart failure: Secondary | ICD-10-CM

## 2020-08-04 DIAGNOSIS — I10 Essential (primary) hypertension: Secondary | ICD-10-CM | POA: Diagnosis not present

## 2020-08-04 DIAGNOSIS — E7800 Pure hypercholesterolemia, unspecified: Secondary | ICD-10-CM | POA: Diagnosis not present

## 2020-08-04 DIAGNOSIS — R0609 Other forms of dyspnea: Secondary | ICD-10-CM

## 2020-08-04 DIAGNOSIS — R0600 Dyspnea, unspecified: Secondary | ICD-10-CM

## 2020-08-04 DIAGNOSIS — I447 Left bundle-branch block, unspecified: Secondary | ICD-10-CM

## 2020-08-04 NOTE — Patient Instructions (Addendum)
Medication Instructions:  Decrease to taking Hydralazine to 1/2 tablet of (50 mg) in the morning and at lunch    may take an additional 1/2 tablet if blood pressure is greater than 827 systolic . You can also take something for a headache if needed   *If you need a refill on your cardiac medications before your next appointment, please call your pharmacy*   Lab Work: Not needed If you have labs (blood work) drawn today and your tests are completely normal, you will receive your results only by: Marland Kitchen MyChart Message (if you have MyChart) OR . A paper copy in the mail If you have any lab test that is abnormal or we need to change your treatment, we will call you to review the results.   Testing/Procedures: Not needed   Follow-Up: At [facility], you and your health needs are our priority.  As part of our continuing mission to provide you with exceptional heart care, we have created designated Provider Care Teams.  These Care Teams include your primary Cardiologist (physician) and Advanced Practice Providers (APPs -  Physician Assistants and Nurse Practitioners) who all work together to provide you with the care you need, when you need it.     Your next appointment:   5 month(s) March  The format for your next appointment:   In Person  Provider:   Glenetta Hew, MD   Other Instructions  If you have Jaw pain with exertion contact office.   Check blood pressure  once or twice a day 2 to 3 times a day  If blood pressure are good for next 3 weeks then you do not not need to check so regularly You can check a week prior to the next office appointment.

## 2020-08-04 NOTE — Progress Notes (Signed)
PCP: Hoyt Koch, MD  Cardiologist: Glenetta Hew, MD EP: Dr. Curt Bears  CLINIC NOTE   Chief Complaint  Patient presents with  . Follow-up    Now having jaw pain  . Hypertension    Has had higher blood pressure.  Intolerant of meds   HISTORY OF PRESENT ILLNESS   Lisa Cox is a 74 y.o. female who is being seen today for six-month follow-up evaluation of frequent RVOT PVCs along with aortic insufficiency, labile blood pressure and multiple other complaints who presents here for reevaluation after 6 weeks. Lisa Cox is a former patient of Dr. Warren Danes whom he last saw on 12/19/2015.  Frequent symptomatic PVCs  Initially seen by Dr. Curt Bears on 07/09/2019 in person then follow-up by telemedicine in October 2020.Noted mild exertional dyspnea but mostly noted her symptomatic PVCs.  August 19, 2019.  -->  RVOT PVC ablation  Last EP visit with Dr. Curt Bears April 14, 2020 -> noted to have elevated blood pressures, usually better at home. ->  Also noted LEFT BUNDLE BRANCH BLOCK on EKG that was not previously there.  Potentially related to ablation versus simple progression of conduction disease disorder..  Hypertension: Somewhat labile and difficult to control.  Notes swelling with amlodipine and therefore will not take.  HCTZ discontinued due to hyponatremia.  I just saw September 1 with complaints of chest burning, throat tightness walking and exertional dyspnea.  Palpitations remain minimal.  She noted easy fatigue.  Blood pressure actually look pretty good. => Echocardiogram ordered.  Discussed the possibility of coronary CTA with her throat tightness.  Recommended that she take atorvastatin 20 mg every other day or 10 mg daily.  Recent Hospitalizations: None  Since her last visit, she has had multiple telephone calls.  07/19/2020: Called about concern about pericarditis noted on echocardiogram and question about COVID-19 vaccine.  07/25/2020: Noted having  left-sided jaw pain and was told by her dentist that it was not related to her teeth.  Pain is intermittent and not necessarily exertional.  She also noted elevated blood pressures as high as 185/85 mmHg.  She is very nervous.  We decided to try to start amlodipine 2.5 mg which she rejected because of prior history of edema. ->  Follow-up call 10/6--decided to start hydralazine 50 mg twice daily -> still noting jaw pain   Reviewed CV studies   Studies Personally Reviewed - (if available, images/films reviewed: From Epic Chart or Care Everywhere)  TTE 07/11/2020: Normal LV size and function.  EF 55-60%.  Moderate asymmetric with basal septal hypertrophy.  Indeterminate diastolic pressures.  Small pericardial effusion moderate AI.  Normal CVP.    [place] returns because of her recent issues with throat tightness and jaw pain as well as labile blood pressures.  She says that something is going wrong with her heart.  She feels palpitations off and on but has been noticing this constant jaw pain.  It is left-sided jaw pain that is not necessarily exertional in nature.  It can happen at any time.  That burning sensation in her chest seems to have improved.  She does have some exertional dyspnea, but is not really doing much.  At this point she is just extremely anxious and nervous about to start anything.  I reviewed the echocardiogram results with her and indicated that small pericardial effusion does not mean pericarditis.  We talked about the Covid vaccine, and indicated that the J&J vaccine was a one-time  shot and they do not yet have the booster plan.  She is not having any PND orthopnea.  She is extremely anxious which is probably part of the reason why her blood pressure still high.  She did not do well with hydralazine.  She said it made her feel lightheaded and headache.  She has not been taking it.  It made it difficult for her to sleep.  Cardiovascular review of symptoms:  positive for - dyspnea on exertion and Jaw pain with still having exertional dyspnea.  No more throat tightness. negative for - edema, irregular heartbeat, orthopnea, palpitations, paroxysmal nocturnal dyspnea, rapid heart rate, shortness of breath or syncope/ near syncope, TIA/amarusosi fugax, claudication  She did not start taking full 20 mg atorvastatin.  Is taking 1/2 tablet daily every other day.  REVIEW OF SYMPTOMS    ROS: A comprehensive was performed. Review of Systems  Constitutional: Negative for malaise/fatigue.  HENT: Negative for congestion and nosebleeds.        Left-sided jaw pain  Respiratory: Positive for shortness of breath (Worse with exertion).   Cardiovascular: Positive for chest pain (Not really noticing that throat burning or chest tightness anymore.).       Per history of present illness  Gastrointestinal: Negative for abdominal pain, blood in stool, heartburn, melena and nausea.  Genitourinary: Negative for hematuria.  Musculoskeletal: Positive for joint pain. Negative for myalgias.  Neurological: Positive for tingling and headaches (Had a headache when she took hydralazine). Negative for dizziness.  Endo/Heme/Allergies: Negative for environmental allergies.  Psychiatric/Behavioral: The patient is nervous/anxious and has insomnia.    I have reviewed and (if needed) personally updated the patient's problem list, medications, allergies, past medical and surgical history, social and family history.    PAST MEDICAL/SURGICAL HISTORY    Past Medical History:  Diagnosis Date  . Abdominal pain, epigastric 11/12/2019  . Acute cystitis with hematuria 10/29/2019  . Anxiety   . Bell's palsy 2009  . Benign hypertensive heart disease without heart failure 07/11/2011  . Cancer ([place]) Feb.2011   breast,lumpectomy right side followed by radiation  . Depression   . Dysuria 10/29/2019  . Elevated lipase 11/18/2019  . Fatigue 05/26/2019  . Frequent unifocal PVCs 04/02/2017   She  was post of had an echocardiogram done which I don't see having been done. At this point, I think with her not having too many symptoms of PVCs we can watch for now and then maybe oracular follow-up.  Marland Kitchen GERD (gastroesophageal reflux disease)   . Hypercholesterolemia 07/11/2011  . Hyperlipidemia   . Hyponatremia 11/18/2019  . Insomnia 04/10/2016  . Nausea 11/12/2019  . Personal history of radiation therapy   . PVC (premature ventricular contraction) 10/31/2018   Frequent PVCs with some bigeminy noted on 48-hour monitor. No arrhythmias.==> s/p RVOT PVC ablation October 2020  . Routine general medical examination at a health care facility 07/12/2017  . UTI symptoms 10/02/2019   Immunization History  Administered Date(s) Administered  . Fluad Quad(high Dose 65+) 07/01/2019  . Influenza, High Dose Seasonal PF 07/12/2017, 08/05/2018  . Influenza,inj,Quad PF,6+ Mos 07/30/2016  . Influenza-Unspecified 08/22/2013  . Pneumococcal Polysaccharide-23 07/12/2017     Past Surgical History:  Procedure Laterality Date  . BREAST LUMPECTOMY Right Feb. 2011   right breast,followed by radiation therapy  . CARDIAC EVENT MONITOR     Predominantly sinus rhythm with first-degree block. Minimum heart rate 63 bpm. Maximum heart rate 139 bpm. Average is 85 bpm. 17.5% PVCs.  V bigem &  Trigem noted.    Marland Kitchen EYE SURGERY  1953   skin removal in eye  . GXT  04/2017   Exercise tolerance test off of beta-blockers: Normal blood pressure response to exercise.  No EKG changes to suggest ischemic changes.  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  Seen at rest, exercise and recovery.  Marland Kitchen NM MYOVIEW LTD  08/2018   LOW RISK. No ischemia or infarction  . PVC ABLATION N/A 08/19/2019   Procedure: PVC ABLATION;  Surgeon: Constance Haw, MD;  Location: [facility];  Service: Cardiovascular;  Laterality: N/A;  . TRANSTHORACIC ECHOCARDIOGRAM  08/2018; 06/2020   a) EF 55-60%.  No RWMA. Gr 2 DD. Mod AI.; b) Normal  LV size and function.  EF 55-60%.  Moderate asymmetric with basal septal hypertrophy.  Indeterminate diastolic pressures.  Small pericardial effusion, moderate AI.  Normal CVP.  Marland Kitchen TUBAL LIGATION  1978    Cardiac Event Monitor October 2019: Predominantly sinus rhythm with first-degree block. Minimum heart rate 63 bpm. Maximum heart rate 139 bpm. Average is 85 bpm. 17.5% PVCs.  V bigem & Trigem noted.     MEDICATIONS / ALLERGIES    Current Meds  Medication Sig  . aspirin 81 MG tablet Take 81 mg by mouth every evening.   Marland Kitchen atenolol (TENORMIN) 50 MG tablet TAKE 1 TABLET BY MOUTH TWICE A DAY  . atorvastatin (LIPITOR) 20 MG tablet Take 10 mg by mouth every other day. Pt takes 10 mg daily   . carboxymethylcellulose (REFRESH TEARS) 0.5 % SOLN Place 1 drop into both eyes 3 (three) times daily as needed (dry eye).   . Cholecalciferol (VITAMIN D3) 50 MCG (2000 UT) TABS Take 2,000 Units by mouth daily.   . ciprofloxacin (CIPRO) 250 MG tablet Take 1 tablet (250 mg total) by mouth 2 (two) times daily.  Marland Kitchen dimenhyDRINATE (DRAMAMINE) 50 MG tablet Take 50 mg by mouth at bedtime. Must take with Lorazepam  . famotidine (PEPCID) 10 MG tablet Take 10 mg by mouth at bedtime as needed for heartburn or indigestion.   . hydrALAZINE (APRESOLINE) 50 MG tablet Take 50 mg twice a day (Patient taking differently: Take 50 mg twice a day. Pt gets headache when taking 50 mg so she cuts it in half. Pt takes half tablet once a day.)  . ibuprofen (ADVIL) 200 MG tablet Take 600 mg by mouth every 6 (six) hours as needed for headache.  Marland Kitchen LORazepam (ATIVAN) 1 MG tablet TAKE 1 TABLET BY MOUTH EVERYDAY AT BEDTIME  . Multiple Vitamins-Minerals (PRESERVISION AREDS 2) CAPS Take 1 capsule by mouth 2 (two) times daily.  . ramipril (ALTACE) 10 MG capsule TAKE 2 CAPSULES BY MOUTH DAILY  . sodium chloride (OCEAN) 0.65 % SOLN nasal spray Place 1 spray into both nostrils as needed for congestion.   Recently treated with Cipro for  UTI.  Allergies  Allergen Reactions  . Hctz [Hydrochlorothiazide] Other (See Comments)    Patient developed significant hyponatremia shortly after starting.  Would also include chlorthalidone  . Amlodipine Other (See Comments)    Peripheral edema  . Crestor [Rosuvastatin Calcium] Other (See Comments)    Leg cramps  . Erythromycin Nausea Only  . Macrobid [Nitrofurantoin Monohyd Macro] Nausea And Vomiting  . Niacin And Related Itching  . Restoril Other (See Comments)    Reaction-"nervous"  . Zolpidem Tartrate     Reaction-"nervous"    SOCIAL HISTORY/FAMILY HISTORY   Social History   Tobacco Use  . Smoking  status: Never Smoker  . Smokeless tobacco: Never Used  Substance Use Topics  . Alcohol use: No  . Drug use: No   Social History   Social History Narrative  . Not on file   Family History family history includes Breast cancer (age of onset: 60) in her paternal aunt; Heart attack in her father; Heart disease in her father; Hypertension in her brother and sister; Pancreatic cancer in her father; Stroke in her maternal aunt.   OBJCTIVE -PE, EKG, labs   Wt Readings from Last 3 Encounters:  08/04/20 155 lb 9.6 oz (70.6 kg)  07/22/20 154 lb 12.8 oz (70.2 kg)  06/22/20 152 lb (68.9 kg)    PHYSICAL EXAM BP (!) 162/82   Pulse 79   Ht 5' 5.5" (1.664 m)   Wt 155 lb 9.6 oz (70.6 kg)   BMI 25.50 kg/m  Previous evaluation on August 19 showed blood pressure 117/65 and she is also recorded 134/79 mmHg. Physical Exam Vitals and nursing note reviewed.  Constitutional:      General: She is not in acute distress.    Appearance: Normal appearance. She is well-developed. She is not ill-appearing.     Comments: Well-groomed, healthy-appearing  HENT:     Head: Normocephalic and atraumatic.  Neck:     Vascular: No carotid bruit, hepatojugular reflux or JVD.  Cardiovascular:     Rate and Rhythm: Normal rate and regular rhythm.  No extrasystoles are present.    Chest Wall: PMI  is not displaced.     Pulses: Normal pulses.     Heart sounds: Normal heart sounds. No murmur heard.  No friction rub. No gallop.   Pulmonary:     Effort: No respiratory distress.     Breath sounds: Normal breath sounds. No wheezing or rales.  Musculoskeletal:        General: No swelling. Normal range of motion.     Cervical back: Normal range of motion and neck supple.  Neurological:     General: No focal deficit present.     Mental Status: She is alert and oriented to person, place, and time.     Cranial Nerves: No cranial nerve deficit.  Psychiatric:        Mood and Affect: Mood normal.        Behavior: Behavior normal.        Thought Content: Thought content normal.        Judgment: Judgment normal.     Comments: Very anxious     Adult ECG Report Sinus rhythm.  Biphasic anteroseptal ST-T wave abnormality with T wave inversions--would be consistent with evolutionary changes of anterior MI.  Other studies Reviewed: Additional studies/ records that were reviewed today include:  Recent Labs:  Managed by PCP Lab Results  Component Value Date   CREATININE 0.95 06/23/2020   BUN 13 06/23/2020   NA 136 06/23/2020   K 4.4 06/23/2020   CL 97 06/23/2020   CO2 26 06/23/2020   Lab Results  Component Value Date   CHOL 158 06/23/2020   HDL 45 06/23/2020   LDLCALC 90 06/23/2020   TRIG 126 06/23/2020   CHOLHDL 3.5 06/23/2020    ASSESSMENT AND PLAN   Problem List Items Addressed This Visit    Frequent unifocal PVCs - Primary (Chronic)    Thankfully, these seem to have stabilized after her ablation and high-dose atenolol.      Relevant Orders   EKG 12-Lead (Completed)   Hypercholesterolemia (Chronic)  She was not able to tolerate taking 20 mg of atorvastatin.  She is therefore taken 10 mg every other day.  Last LDL was 90.  We talked about potentially looking at ordering a Coronary CTA.  Pending those results, we may need to be more aggressive.      Benign hypertensive  heart disease without heart failure (Chronic)            Essential hypertension (Chronic)    Her blood pressure is seated pretty high on high-dose atenolol and ramipril (both twice daily).  She had swelling on amlodipine and therefore would not take it. She did not really start taking hydralazine, indicating that it gave her headache and made her feel flushed.  Plan: Continue atenolol and ramipril.  I will have her try taking her hydralazine during the morning and afternoon to avoid issues with sleep.  She will start taking half tablet in the morning and full tablet in the afternoon.  Additional half tablet as needed elevated blood pressures (sustained over 170/90 mmHg.).   Recommendation for home monitoring;  Check blood pressure  once or twice a day 2 to 3 times a day  If blood pressure are good for next 3 weeks then you do not not need to check so regularly You can check a week prior to the next office appointment.         Complete left bundle branch block (LBBB) (Chronic)    Unfortunately did get an EKG on her last visit, but current EKG actually now shows evolution changes of anterior STEMI.  The interesting thing is that her echocardiogram does not show any wall motion normalities or septal wall motion abnormality.  I was little concerned about this, and we talked about potentially checking a coronary CTA.  She initially was reluctant to do so.   My suspicion is relatively low that this was anything of concern based on the echocardiogram, however with her having intermittent episodes of chest/throat tightness and now jaw pain, we need to exclude ischemia.  My recommendation was to check Coronary CT angiogram.  She will think about it and determine if she would like to do so.  However, if her symptoms progress or get worse i.e. jaw pain worse with exertion, I think we definitely need to ischemic evaluation potentially skipping Coronary CTA, and going straight to cardiac  catheterization.      DOE (dyspnea on exertion) - with throat tightness    Normal echocardiogram.  Now having jaw pain, but less notable chest tightness.  We talked last visit about considering coronary CTA.  Now with abnormal EKG, I do think that this is a reasonable idea.  She was not ready to do another study at this point, but will contact us if her decision changes.        The patient does not have symptoms concerning for COVID-19 infection (fever, chills, cough, or new shortness of breath).  The patient is practicing social distancing.   COVID-19 Education: The signs and symptoms of COVID-19 were discussed with the patient and how to seek care for testing (follow up with PCP or arrange E-visit).   The importance of social distancing was discussed today.  I spent a total of 22 minutes with the patient in direct patient consultation.  Additional time spent with chart review  / charting (studies, outside notes, etc): 10 Total Time: 94min  Current medicines are reviewed at length with the patient today. (+/- concerns) concerned about statin dosing The following  changes have been made:Recommended either taking 20 mg every other day statin or 10 mg daily.   Patient Instructions / Medication Changes & Studies & Tests Ordered   Patient Instructions  Medication Instructions:  Decrease to taking Hydralazine to 1/2 tablet of (50 mg) in the morning and at lunch    may take an additional 1/2 tablet if blood pressure is greater than 286 systolic . You can also take something for a headache if needed   *If you need a refill on your cardiac medications before your next appointment, please call your pharmacy*   Lab Work: Not needed If you have labs (blood work) drawn today and your tests are completely normal, you will receive your results only by: Marland Kitchen MyChart Message (if you have MyChart) OR . A paper copy in the mail If you have any lab test that is abnormal or we need to change  your treatment, we will call you to review the results.   Testing/Procedures: Not needed   Follow-Up: At [facility], you and your health needs are our priority.  As part of our continuing mission to provide you with exceptional heart care, we have created designated Provider Care Teams.  These Care Teams include your primary Cardiologist (physician) and Advanced Practice Providers (APPs -  Physician Assistants and Nurse Practitioners) who all work together to provide you with the care you need, when you need it.     Your next appointment:   5 month(s) March  The format for your next appointment:   In Person  Provider:   Glenetta Hew, MD   Other Instructions  If you have Jaw pain with exertion contact office.   Check blood pressure  once or twice a day 2 to 3 times a day  If blood pressure are good for next 3 weeks then you do not not need to check so regularly You can check a week prior to the next office appointment.   Studies Ordered:   Orders Placed This Encounter  Procedures  . EKG 12-Lead      Glenetta Hew, M.D., M.S. Interventional Cardiologist   Pager # [phone] Phone # [phone] [address]. [place], [place] [address]

## 2020-08-05 ENCOUNTER — Telehealth: Payer: Self-pay | Admitting: Cardiology

## 2020-08-05 NOTE — Telephone Encounter (Signed)
Patient return call . She states she was very concerned after referring her EKG that was  Completed yesterday at office visit.  she states she google the wording  " anteroseptal infarct " . She said she thought there is something wrong with her heart. RN informed patient Dr Yvonne Kendall information nothing to be concerned . Patient states that is why she want to do the Ct scan

## 2020-08-05 NOTE — Telephone Encounter (Signed)
Patient states after reviewing her EKG on My chart she would like to go ahead and have the CT Angio do that Dr. Ellyn Hack suggested.

## 2020-08-05 NOTE — Telephone Encounter (Signed)
Called patient no answer left message back .  will review with Dr Ellyn Hack and call back to schedule and give instructions

## 2020-08-06 NOTE — Telephone Encounter (Signed)
So - we talked about doing a ST vs. Cor CTA (or even Cath).  She was initially very reluctant. The EKG perplexed me as well.    I told her that her EKG shows what we call - evolutionary changes of a prior Heart Attack - that could be recovery of the Left Bundle Branch Block noted previously.  The Echocardiogram done in Sept is very reassuring in that there was no evidence of wall motion abnormalities seen in patients following a Heart Attack.  I suspect - as did Dr. Curt Bears that the changes are related to the PVC ablation.  There are 2 definitive ways to assess her  - 1 is invasive & the other is not.   Coronary CTA would confirm or deny the presence of CAD in the LAD (that would correlate with this finding); Cardiac Cath is invasive & the MOST definitive technique  & can allow [place] to treat a "culprit blockage" if seen, but carries with it the risk of an invasive procedure (albeit relatively low risk)   Risks / Complications include, but not limited to: Death, MI, CVA/TIA, VF/VT (Cardiac Arrest with CPR/defibrillation), Bradycardia (need for temporary pacer placement), contrast induced nephropathy (kidney injury), bleeding / bruising / hematoma / pseudoaneurysm, radiation-related injury in the case of prolonged fluoroscopy use, vascular or coronary injury (with possible emergent CT or Vascular Surgery), adverse medication reactions, infection.    I am happy to offer her either option.   Glenetta Hew, M.D., M.S.

## 2020-08-07 ENCOUNTER — Encounter: Payer: Self-pay | Admitting: Cardiology

## 2020-08-07 NOTE — Assessment & Plan Note (Signed)
Thankfully, these seem to have stabilized after her ablation and high-dose atenolol.

## 2020-08-07 NOTE — Assessment & Plan Note (Signed)
Unfortunately did get an EKG on her last visit, but current EKG actually now shows evolution changes of anterior STEMI.  The interesting thing is that her echocardiogram does not show any wall motion normalities or septal wall motion abnormality.  I was little concerned about this, and we talked about potentially checking a coronary CTA.  She initially was reluctant to do so.   My suspicion is relatively low that this was anything of concern based on the echocardiogram, however with her having intermittent episodes of chest/throat tightness and now jaw pain, we need to exclude ischemia.  My recommendation was to check Coronary CT angiogram.  She will think about it and determine if she would like to do so.  However, if her symptoms progress or get worse i.e. jaw pain worse with exertion, I think we definitely need to ischemic evaluation potentially skipping Coronary CTA, and going straight to cardiac catheterization.

## 2020-08-07 NOTE — Assessment & Plan Note (Signed)
She was not able to tolerate taking 20 mg of atorvastatin.  She is therefore taken 10 mg every other day.  Last LDL was 90.  We talked about potentially looking at ordering a Coronary CTA.  Pending those results, we may need to be more aggressive.

## 2020-08-07 NOTE — Assessment & Plan Note (Addendum)
Her blood pressure is seated pretty high on high-dose atenolol and ramipril (both twice daily).  She had swelling on amlodipine and therefore would not take it. She did not really start taking hydralazine, indicating that it gave her headache and made her feel flushed.  Plan: Continue atenolol and ramipril.  I will have her try taking her hydralazine during the morning and afternoon to avoid issues with sleep.  She will start taking half tablet in the morning and full tablet in the afternoon.  Additional half tablet as needed elevated blood pressures (sustained over 170/90 mmHg.).   Recommendation for home monitoring;  Check blood pressure  once or twice a day 2 to 3 times a day  If blood pressure are good for next 3 weeks then you do not not need to check so regularly You can check a week prior to the next office appointment.

## 2020-08-07 NOTE — Assessment & Plan Note (Signed)
Normal echocardiogram.  Now having jaw pain, but less notable chest tightness.  We talked last visit about considering coronary CTA.  Now with abnormal EKG, I do think that this is a reasonable idea.  She was not ready to do another study at this point, but will contact us if her decision changes.

## 2020-09-05 ENCOUNTER — Ambulatory Visit: Payer: PPO | Admitting: Internal Medicine

## 2020-09-06 ENCOUNTER — Other Ambulatory Visit: Payer: Self-pay | Admitting: Internal Medicine

## 2020-09-06 NOTE — Telephone Encounter (Signed)
ciprofloxacin (CIPRO) 250 MG tablet Lisa Cox prescribed this medication for her last month for her UTI and she never finished the medication and the feeling of the UTI came back so she took the rest of the medication and is still in pain so she is wondering if she needs another dosage or what she should do.

## 2020-09-06 NOTE — Telephone Encounter (Signed)
Do we know how many pills of the medicine she took this time.

## 2020-09-07 ENCOUNTER — Telehealth: Payer: Self-pay

## 2020-09-07 NOTE — Telephone Encounter (Signed)
Left vm for pt to return call. Need to ask how much of the meds has she taken already.

## 2020-09-07 NOTE — Telephone Encounter (Signed)
Pt stated that she has taken cipro for 3 days and she is now still burning she feels it wasn't enough.

## 2020-09-08 NOTE — Telephone Encounter (Signed)
Patient states she feels better and that she does not feel the need to come in at the moment however you understands that she can call back for an appointment if symptoms are not any better.

## 2020-09-08 NOTE — Telephone Encounter (Signed)
Recommend acute visit to assess.

## 2020-10-20 ENCOUNTER — Other Ambulatory Visit: Payer: Self-pay

## 2020-10-20 ENCOUNTER — Encounter: Payer: Self-pay | Admitting: Cardiology

## 2020-10-20 ENCOUNTER — Ambulatory Visit: Payer: PPO | Admitting: Cardiology

## 2020-10-20 VITALS — BP 140/84 | HR 77 | Ht 65.5 in | Wt 155.6 lb

## 2020-10-20 DIAGNOSIS — I493 Ventricular premature depolarization: Secondary | ICD-10-CM

## 2020-10-20 NOTE — Progress Notes (Signed)
Electrophysiology Office Note   Date:  10/20/2020   ID:  Lisa Cox, Lisa Cox June 19, 1946, MRN [id number]  PCP:  Hoyt Koch, MD  Cardiologist:  Ellyn Hack Primary Electrophysiologist:  Sharley Keeler Meredith Leeds, MD    No chief complaint on file.    History of Present Illness: Lisa Cox is a 74 y.o. female who is being seen today for the evaluation of PVCs at the request of Glenetta Hew. Presenting today for electrophysiology evaluation.    She has a history significant for hypertension, hyperlipidemia, and palpitations.  She wore a cardiac monitor and showed to have a high burden of PVCs.  She did not tolerate flecainide, diltiazem, mexiletine.  She is status post PVC ablation 08/19/2019.  Today, denies symptoms of palpitations, chest pain, shortness of breath, orthopnea, PND, lower extremity edema, claudication, dizziness, presyncope, syncope, bleeding, or neurologic sequela. The patient is tolerating medications without difficulties.  Currently she is doing well.  She has no chest pain or shortness of breath and is able to do all of her daily activities.  She is currently unaware of palpitations.   Past Medical History:  Diagnosis Date  . Abdominal pain, epigastric 11/12/2019  . Acute cystitis with hematuria 10/29/2019  . Anxiety   . Bell's palsy 2009  . Benign hypertensive heart disease without heart failure 07/11/2011  . Cancer ([place]) Feb.2011   breast,lumpectomy right side followed by radiation  . Depression   . Dysuria 10/29/2019  . Elevated lipase 11/18/2019  . Fatigue 05/26/2019  . Frequent unifocal PVCs 04/02/2017   She was post of had an echocardiogram done which I don't see having been done. At this point, I think with her not having too many symptoms of PVCs we can watch for now and then maybe oracular follow-up.  Marland Kitchen GERD (gastroesophageal reflux disease)   . Hypercholesterolemia 07/11/2011  . Hyperlipidemia   . Hyponatremia 11/18/2019  . Insomnia 04/10/2016  .  Nausea 11/12/2019  . Personal history of radiation therapy   . PVC (premature ventricular contraction) 10/31/2018   Frequent PVCs with some bigeminy noted on 48-hour monitor. No arrhythmias.==> s/p RVOT PVC ablation October 2020  . Routine general medical examination at a health care facility 07/12/2017  . UTI symptoms 10/02/2019   Past Surgical History:  Procedure Laterality Date  . BREAST LUMPECTOMY Right Feb. 2011   right breast,followed by radiation therapy  . CARDIAC EVENT MONITOR     Predominantly sinus rhythm with first-degree block. Minimum heart rate 63 bpm. Maximum heart rate 139 bpm. Average is 85 bpm. 17.5% PVCs.  V bigem & Trigem noted.    Marland Kitchen EYE SURGERY  1953   skin removal in eye  . GXT  04/2017   Exercise tolerance test off of beta-blockers: Normal blood pressure response to exercise.  No EKG changes to suggest ischemic changes.  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  Seen at rest, exercise and recovery.  Marland Kitchen NM MYOVIEW LTD  08/2018   LOW RISK. No ischemia or infarction  . PVC ABLATION N/A 08/19/2019   Procedure: PVC ABLATION;  Surgeon: Constance Haw, MD;  Location: [facility];  Service: Cardiovascular;  Laterality: N/A;  . TRANSTHORACIC ECHOCARDIOGRAM  08/2018; 06/2020   a) EF 55-60%.  No RWMA. Gr 2 DD. Mod AI.; b) Normal LV size and function.  EF 55-60%.  Moderate asymmetric with basal septal hypertrophy.  Indeterminate diastolic pressures.  Small pericardial effusion, moderate AI.  Normal CVP.  Marland Kitchen TUBAL LIGATION  1978     Current Outpatient Medications  Medication Sig Dispense Refill  . aspirin 81 MG tablet Take 81 mg by mouth every evening.    Marland Kitchen atenolol (TENORMIN) 50 MG tablet TAKE 1 TABLET BY MOUTH TWICE A DAY 180 tablet 3  . atorvastatin (LIPITOR) 20 MG tablet Take 10 mg by mouth every other day. Pt takes 10 mg daily    . carboxymethylcellulose (REFRESH PLUS) 0.5 % SOLN Place 1 drop into both eyes 3 (three) times daily as needed (dry eye).      . Cholecalciferol (VITAMIN D3) 50 MCG (2000 UT) TABS Take 2,000 Units by mouth daily.     Marland Kitchen dimenhyDRINATE (DRAMAMINE) 50 MG tablet Take 50 mg by mouth at bedtime. Must take with Lorazepam    . famotidine (PEPCID) 10 MG tablet Take 10 mg by mouth at bedtime as needed for heartburn or indigestion.     Marland Kitchen ibuprofen (ADVIL) 200 MG tablet Take 600 mg by mouth every 6 (six) hours as needed for headache.    Marland Kitchen LORazepam (ATIVAN) 1 MG tablet TAKE 1 TABLET BY MOUTH EVERYDAY AT BEDTIME 30 tablet 5  . Multiple Vitamins-Minerals (PRESERVISION AREDS 2) CAPS Take 1 capsule by mouth 2 (two) times daily.    . ramipril (ALTACE) 10 MG capsule TAKE 2 CAPSULES BY MOUTH DAILY 180 capsule 3  . sodium chloride (OCEAN) 0.65 % SOLN nasal spray Place 1 spray into both nostrils as needed for congestion.     No current facility-administered medications for this visit.    Allergies:   Hctz [hydrochlorothiazide], Amlodipine, Crestor [rosuvastatin calcium], Erythromycin, Macrobid [nitrofurantoin monohyd macro], Niacin and related, Restoril, and Zolpidem tartrate   Social History:  The patient  reports that she has never smoked. She has never used smokeless tobacco. She reports that she does not drink alcohol and does not use drugs.   Family History:  The patient's family history includes Breast cancer (age of onset: 44) in her paternal aunt; Heart attack in her father; Heart disease in her father; Hypertension in her brother and sister; Pancreatic cancer in her father; Stroke in her maternal aunt.   ROS:  Please see the history of present illness.   Otherwise, review of systems is positive for none.   All other systems are reviewed and negative.   PHYSICAL EXAM: VS:  BP 140/84   Pulse 77   Ht 5' 5.5" (1.664 m)   Wt 155 lb 9.6 oz (70.6 kg)   SpO2 96%   BMI 25.50 kg/m  , BMI Body mass index is 25.5 kg/m. GEN: Well nourished, well developed, in no acute distress  HEENT: normal  Neck: no JVD, carotid bruits, or  masses Cardiac: RRR; no murmurs, rubs, or gallops,no edema  Respiratory:  clear to auscultation bilaterally, normal work of breathing GI: soft, nontender, nondistended, + BS MS: no deformity or atrophy  Skin: warm and dry Neuro:  Strength and sensation are intact Psych: euthymic mood, full affect  EKG:  EKG is ordered today. Personal review of the ekg ordered shows sinus rhythm, rate 77, R wave in V1  Recent Labs: 11/16/2019: Hemoglobin 12.8; Platelets 189.0; TSH 3.43 06/23/2020: ALT 17; BUN 13; Creatinine, Ser 0.95; Potassium 4.4; Sodium 136    Lipid Panel     Component Value Date/Time   CHOL 158 06/23/2020 0928   TRIG 126 06/23/2020 0928   HDL 45 06/23/2020 0928   CHOLHDL 3.5 06/23/2020 0928   CHOLHDL 5 05/26/2019 0735   VLDL 29.0 05/26/2019  [place] 90 06/23/2020 0928     Wt Readings from Last 3 Encounters:  10/20/20 155 lb 9.6 oz (70.6 kg)  08/04/20 155 lb 9.6 oz (70.6 kg)  07/22/20 154 lb 12.8 oz (70.2 kg)      Other studies Reviewed: Additional studies/ records that were reviewed today include: TTE 09/02/18  Review of the above records today demonstrates:   - Left ventricle: The cavity size was normal. Systolic function was   normal. The estimated ejection fraction was in the range of 55%   to 60%. Wall motion was normal; there were no regional wall   motion abnormalities. Features are consistent with a pseudonormal   left ventricular filling pattern, with concomitant abnormal   relaxation and increased filling pressure (grade 2 diastolic   dysfunction). - Aortic valve: There was moderate regurgitation. Regurgitation   pressure half-time: 422 ms. - Mitral valve: There was trivial regurgitation. - Right ventricle: Systolic function was normal. - Atrial septum: No defect or patent foramen ovale was identified. - Tricuspid valve: There was trivial regurgitation. - Pulmonic valve: There was no significant regurgitation.  SPECT 09/11/18  There was no ST  segment deviation noted during stress.  The study is normal.  This is a low risk study with no evidence of perfusion abnormalities.  This study was not gated due to frequent ventricular ectopy.  Monitor 08/26/18 - personally reviewed  Predominantly sinus rhythm with first-degree block. Minimum heart rate 63 bpm. Maximum heart rate 139 bpm. Average is 85 bpm.  Very rare PACs noted. (<1%)  Frequent isolated PVCs (17.5%);  Ventricular trigeminy and bigeminy noted on diary.  No ventricular tachycardia or other arrhythmias. No pauses or bradycardia.   Significant amount of ventricular ectopy noted, but no arrhythmia.  ASSESSMENT AND PLAN:  1.  PVCs: Did not tolerate flecainide or mexiletine.  Status post PVC ablation 08/19/2019.  Fortunately no further PVCs.  2.  Hypertension: Mildly elevated today but usually well controlled.  No changes.  3.  Left bundle branch block: Developed in December 2020.  This is her only conduction system disease.  Continue to monitor.  Left bundle branch block has improved and has normalized.  Case discussed with primary cardiology  Current medicines are reviewed at length with the patient today.   The patient does not have concerns regarding her medicines.  The following changes were made today: None  Labs/ tests ordered today include:  Orders Placed This Encounter  Procedures  . EKG 12-Lead    Disposition:   FU with Day Deery 12 months  Signed, Emma Birchler Meredith Leeds, MD  10/20/2020 8:48 AM     [facility] [address] [place] [address] [phone] (office) [phone] (fax)

## 2020-10-25 ENCOUNTER — Encounter: Payer: PPO | Admitting: Internal Medicine

## 2020-11-16 ENCOUNTER — Encounter: Payer: PPO | Admitting: Internal Medicine

## 2020-11-21 ENCOUNTER — Other Ambulatory Visit: Payer: Self-pay | Admitting: Internal Medicine

## 2020-11-21 NOTE — Telephone Encounter (Signed)
LR: 05-26-2020 Qty: 30 with 5 refills  Last office visit: 07-22-2020 Upcoming appointment: 11-25-2020

## 2020-11-25 ENCOUNTER — Other Ambulatory Visit: Payer: Self-pay

## 2020-11-25 ENCOUNTER — Encounter: Payer: Self-pay | Admitting: Internal Medicine

## 2020-11-25 ENCOUNTER — Ambulatory Visit (INDEPENDENT_AMBULATORY_CARE_PROVIDER_SITE_OTHER): Payer: PPO | Admitting: Internal Medicine

## 2020-11-25 VITALS — BP 124/60 | HR 75 | Temp 98.8°F | Resp 18 | Ht 65.5 in | Wt 154.8 lb

## 2020-11-25 DIAGNOSIS — E7800 Pure hypercholesterolemia, unspecified: Secondary | ICD-10-CM | POA: Diagnosis not present

## 2020-11-25 DIAGNOSIS — I10 Essential (primary) hypertension: Secondary | ICD-10-CM | POA: Diagnosis not present

## 2020-11-25 DIAGNOSIS — G4700 Insomnia, unspecified: Secondary | ICD-10-CM | POA: Diagnosis not present

## 2020-11-25 DIAGNOSIS — K219 Gastro-esophageal reflux disease without esophagitis: Secondary | ICD-10-CM | POA: Diagnosis not present

## 2020-11-25 DIAGNOSIS — Z853 Personal history of malignant neoplasm of breast: Secondary | ICD-10-CM

## 2020-11-25 DIAGNOSIS — Z0000 Encounter for general adult medical examination without abnormal findings: Secondary | ICD-10-CM | POA: Diagnosis not present

## 2020-11-25 NOTE — Assessment & Plan Note (Signed)
Flu shot up to date. Covid-19 2 shots and booster. Pneumonia counseled prevnar 13 due declines. Shingrix counseled declines. Tetanus declines. Colonoscopy aged out prior to recall. Mammogram due 2023, pap smear aged out and dexa declines further. Counseled about sun safety and mole surveillance. Counseled about the dangers of distracted driving. Given 10 year screening recommendations.

## 2020-11-25 NOTE — Progress Notes (Signed)
Subjective:   Patient ID: Lisa Cox, female    DOB: 12-07-1945, 75 y.o.   MRN: [id number]  HPI Here for medicare wellness and physical, no new complaints. Please see A/P for status and treatment of chronic medical problems.   Diet: heart healthy  Physical activity: sedentary Depression/mood screen: negative Hearing: intact to whispered voice Visual acuity: grossly normal with lens, performs annual eye exam  ADLs: capable Fall risk: none Home safety: good Cognitive evaluation: intact to orientation, naming, recall and repetition EOL planning: adv directives discussed  [place] Visit from 11/25/2020 in [facility] Total Score 0      I have personally reviewed and have noted 1. The patient's medical and social history - reviewed today no changes 2. Their use of alcohol, tobacco or illicit drugs 3. Their current medications and supplements 4. The patient's functional ability including ADL's, fall risks, home safety risks and hearing or visual impairment. 5. Diet and physical activities 6. Evidence for depression or mood disorders 7. Care team reviewed and updated  Patient Care Team: Hoyt Koch, MD as PCP - General (Internal Medicine) Leonie Man, MD as PCP - Cardiology (Cardiology) Constance Haw, MD as PCP - Electrophysiology (Cardiology) Past Medical History:  Diagnosis Date  . Abdominal pain, epigastric 11/12/2019  . Acute cystitis with hematuria 10/29/2019  . Anxiety   . Bell's palsy 2009  . Benign hypertensive heart disease without heart failure 07/11/2011  . Cancer ([place]) Feb.2011   breast,lumpectomy right side followed by radiation  . Depression   . Dysuria 10/29/2019  . Elevated lipase 11/18/2019  . Fatigue 05/26/2019  . Frequent unifocal PVCs 04/02/2017   She was post of had an echocardiogram done which I don't see having been done. At this point, I think with her not having too many symptoms of PVCs we  can watch for now and then maybe oracular follow-up.  Marland Kitchen GERD (gastroesophageal reflux disease)   . Hypercholesterolemia 07/11/2011  . Hyperlipidemia   . Hyponatremia 11/18/2019  . Insomnia 04/10/2016  . Nausea 11/12/2019  . Personal history of radiation therapy   . PVC (premature ventricular contraction) 10/31/2018   Frequent PVCs with some bigeminy noted on 48-hour monitor. No arrhythmias.==> s/p RVOT PVC ablation October 2020  . Routine general medical examination at a health care facility 07/12/2017  . UTI symptoms 10/02/2019   Past Surgical History:  Procedure Laterality Date  . BREAST LUMPECTOMY Right Feb. 2011   right breast,followed by radiation therapy  . CARDIAC EVENT MONITOR     Predominantly sinus rhythm with first-degree block. Minimum heart rate 63 bpm. Maximum heart rate 139 bpm. Average is 85 bpm. 17.5% PVCs.  V bigem & Trigem noted.    Marland Kitchen EYE SURGERY  1953   skin removal in eye  . GXT  04/2017   Exercise tolerance test off of beta-blockers: Normal blood pressure response to exercise.  No EKG changes to suggest ischemic changes.  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  Seen at rest, exercise and recovery.  Marland Kitchen NM MYOVIEW LTD  08/2018   LOW RISK. No ischemia or infarction  . PVC ABLATION N/A 08/19/2019   Procedure: PVC ABLATION;  Surgeon: Constance Haw, MD;  Location: [facility];  Service: Cardiovascular;  Laterality: N/A;  . TRANSTHORACIC ECHOCARDIOGRAM  08/2018; 06/2020   a) EF 55-60%.  No RWMA. Gr 2 DD. Mod AI.; b) Normal LV size and function.  EF 55-60%.  Moderate asymmetric with basal septal hypertrophy.  Indeterminate diastolic pressures.  Small pericardial effusion, moderate AI.  Normal CVP.  Marland Kitchen TUBAL LIGATION  1978   Family History  Problem Relation Age of Onset  . Heart disease Father   . Heart attack Father   . Pancreatic cancer Father   . Hypertension Sister   . Hypertension Brother   . Stroke Maternal Aunt   . Breast cancer Paternal  Aunt 38       x 2   Review of Systems  Constitutional: Negative.   HENT: Negative.   Eyes: Negative.   Respiratory: Negative for cough, chest tightness and shortness of breath.   Cardiovascular: Negative for chest pain, palpitations and leg swelling.  Gastrointestinal: Negative for abdominal distention, abdominal pain, constipation, diarrhea, nausea and vomiting.  Musculoskeletal: Negative.   Skin: Negative.   Neurological: Negative.   Psychiatric/Behavioral: Negative.     Objective:  Physical Exam Constitutional:      Appearance: She is well-developed and well-nourished.  HENT:     Head: Normocephalic and atraumatic.  Eyes:     Extraocular Movements: EOM normal.  Cardiovascular:     Rate and Rhythm: Normal rate and regular rhythm.  Pulmonary:     Effort: Pulmonary effort is normal. No respiratory distress.     Breath sounds: Normal breath sounds. No wheezing or rales.  Abdominal:     General: Bowel sounds are normal. There is no distension.     Palpations: Abdomen is soft.     Tenderness: There is no abdominal tenderness. There is no rebound.  Musculoskeletal:        General: No edema.     Cervical back: Normal range of motion.  Skin:    General: Skin is warm and dry.  Neurological:     Mental Status: She is alert and oriented to person, place, and time.     Coordination: Coordination normal.  Psychiatric:        Mood and Affect: Mood and affect normal.     Vitals:   11/25/20 1039  BP: 124/60  Pulse: 75  Resp: 18  Temp: 98.8 F (37.1 C)  TempSrc: Oral  SpO2: 98%  Weight: 154 lb 12.8 oz (70.2 kg)  Height: 5' 5.5" (1.664 m)    This visit occurred during the SARS-CoV-2 public health emergency.  Safety protocols were in place, including screening questions prior to the visit, additional usage of staff PPE, and extensive cleaning of exam room while observing appropriate contact time as indicated for disinfecting solutions.   Assessment & Plan:

## 2020-11-25 NOTE — Patient Instructions (Addendum)
Think about getting the shingrix (shingles vaccine) and the pneumonia 13 shot.   Health Maintenance, Female Adopting a healthy lifestyle and getting preventive care are important in promoting health and wellness. Ask your health care provider about:  The right schedule for you to have regular tests and exams.  Things you can do on your own to prevent diseases and keep yourself healthy. What should I know about diet, weight, and exercise? Eat a healthy diet  Eat a diet that includes plenty of vegetables, fruits, low-fat dairy products, and lean protein.  Do not eat a lot of foods that are high in solid fats, added sugars, or sodium.   Maintain a healthy weight Body mass index (BMI) is used to identify weight problems. It estimates body fat based on height and weight. Your health care provider can help determine your BMI and help you achieve or maintain a healthy weight. Get regular exercise Get regular exercise. This is one of the most important things you can do for your health. Most adults should:  Exercise for at least 150 minutes each week. The exercise should increase your heart rate and make you sweat (moderate-intensity exercise).  Do strengthening exercises at least twice a week. This is in addition to the moderate-intensity exercise.  Spend less time sitting. Even light physical activity can be beneficial. Watch cholesterol and blood lipids Have your blood tested for lipids and cholesterol at 77 years of age, then have this test every 5 years. Have your cholesterol levels checked more often if:  Your lipid or cholesterol levels are high.  You are older than 77 years of age.  You are at high risk for heart disease. What should I know about cancer screening? Depending on your health history and family history, you may need to have cancer screening at various ages. This may include screening for:  Breast cancer.  Cervical cancer.  Colorectal cancer.  Skin  cancer.  Lung cancer. What should I know about heart disease, diabetes, and high blood pressure? Blood pressure and heart disease  High blood pressure causes heart disease and increases the risk of stroke. This is more likely to develop in people who have high blood pressure readings, are of African descent, or are overweight.  Have your blood pressure checked: ? Every 3-5 years if you are 70-31 years of age. ? Every year if you are 61 years old or older. Diabetes Have regular diabetes screenings. This checks your fasting blood sugar level. Have the screening done:  Once every three years after age 19 if you are at a normal weight and have a low risk for diabetes.  More often and at a younger age if you are overweight or have a high risk for diabetes. What should I know about preventing infection? Hepatitis B If you have a higher risk for hepatitis B, you should be screened for this virus. Talk with your health care provider to find out if you are at risk for hepatitis B infection. Hepatitis C Testing is recommended for:  Everyone born from [birth year] through 1967.  Anyone with known risk factors for hepatitis C. Sexually transmitted infections (STIs)  Get screened for STIs, including gonorrhea and chlamydia, if: ? You are sexually active and are younger than 77 years of age. ? You are older than 77 years of age and your health care provider tells you that you are at risk for this type of infection. ? Your sexual activity has changed since you were last screened, and  you are at increased risk for chlamydia or gonorrhea. Ask your health care provider if you are at risk.  Ask your health care provider about whether you are at high risk for HIV. Your health care provider may recommend a prescription medicine to help prevent HIV infection. If you choose to take medicine to prevent HIV, you should first get tested for HIV. You should then be tested every 3 months for as long as you are taking  the medicine. Pregnancy  If you are about to stop having your period (premenopausal) and you may become pregnant, seek counseling before you get pregnant.  Take 400 to 800 micrograms (mcg) of folic acid every day if you become pregnant.  Ask for birth control (contraception) if you want to prevent pregnancy. Osteoporosis and menopause Osteoporosis is a disease in which the bones lose minerals and strength with aging. This can result in bone fractures. If you are 58 years old or older, or if you are at risk for osteoporosis and fractures, ask your health care provider if you should:  Be screened for bone loss.  Take a calcium or vitamin D supplement to lower your risk of fractures.  Be given hormone replacement therapy (HRT) to treat symptoms of menopause. Follow these instructions at home: Lifestyle  Do not use any products that contain nicotine or tobacco, such as cigarettes, e-cigarettes, and chewing tobacco. If you need help quitting, ask your health care provider.  Do not use street drugs.  Do not share needles.  Ask your health care provider for help if you need support or information about quitting drugs. Alcohol use  Do not drink alcohol if: ? Your health care provider tells you not to drink. ? You are pregnant, may be pregnant, or are planning to become pregnant.  If you drink alcohol: ? Limit how much you use to 0-1 drink a day. ? Limit intake if you are breastfeeding.  Be aware of how much alcohol is in your drink. In the [place] one drink equals one 12 oz bottle of beer (355 mL), one 5 oz glass of wine (148 mL), or one 1 oz glass of hard liquor (44 mL). General instructions  Schedule regular health, dental, and eye exams.  Stay current with your vaccines.  Tell your health care provider if: ? You often feel depressed. ? You have ever been abused or do not feel safe at home. Summary  Adopting a healthy lifestyle and getting preventive care are important in  promoting health and wellness.  Follow your health care provider's instructions about healthy diet, exercising, and getting tested or screened for diseases.  Follow your health care provider's instructions on monitoring your cholesterol and blood pressure. This information is not intended to replace advice given to you by your health care provider. Make sure you discuss any questions you have with your health care provider. Document Revised: 10/01/2018 Document Reviewed: 10/01/2018 Elsevier Patient Education  2021 [employer].

## 2020-11-25 NOTE — Assessment & Plan Note (Signed)
Mammogram yearly and up to date.

## 2020-11-25 NOTE — Assessment & Plan Note (Signed)
Lorazepam for sleep is working well. Can refill as needed.

## 2020-11-25 NOTE — Assessment & Plan Note (Signed)
Taking lipitor 10 mg daily. Recent lipid panel at goal.

## 2020-11-25 NOTE — Assessment & Plan Note (Signed)
Uses pepcid intermittently.

## 2020-11-25 NOTE — Assessment & Plan Note (Signed)
Taking atenolol and ramipril and BP at goal. Recent labs are okay.

## 2020-11-29 IMAGING — CT CT HEAD WITHOUT CONTRAST
4 series · 16 of 47 positions shown, 18 images · non-contrast
Comparison: CT 09/28/2011

CLINICAL DATA: Acute on chronic headache for 1 week, recent
medication changes

EXAM:
CT HEAD WITHOUT CONTRAST
TECHNIQUE: Contiguous axial images were obtained from the base of the skull
through the vertex without intravenous contrast.

[Series 3: head without · axial · non-contrast · 0.47mm/px · z∈[-168,-18]mm · 7 of 40 slices shown, 9 images]
[im 5/40  brain]
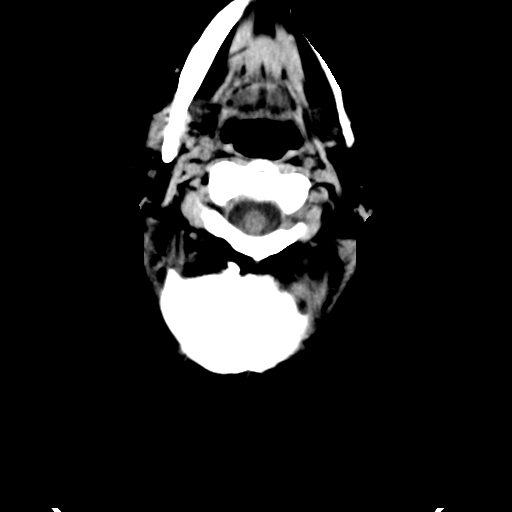
[im 5/40  bone]
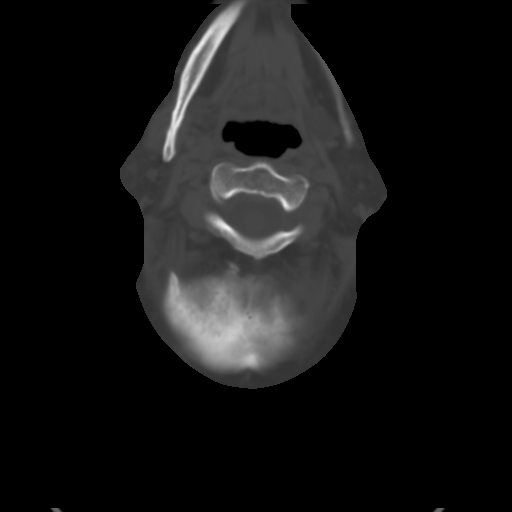
[im 10/40  brain]
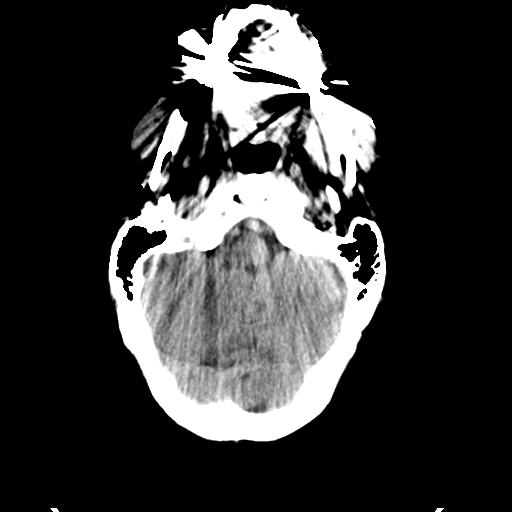
[im 15/40  brain]
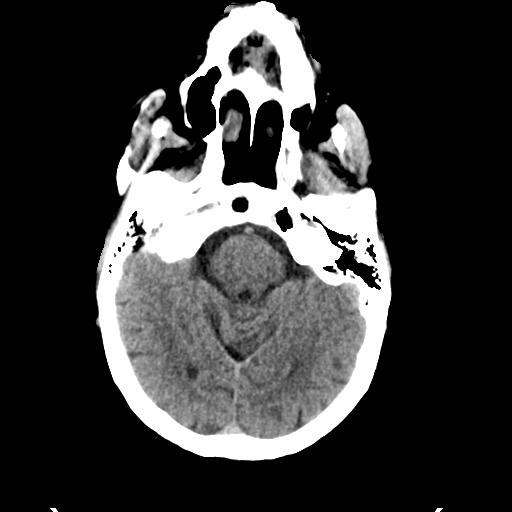
[im 20/40  brain]
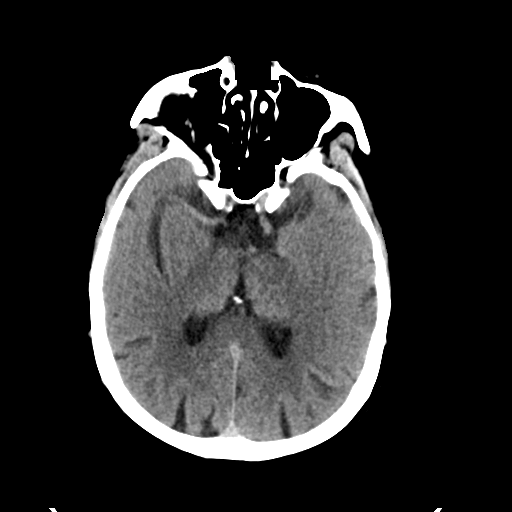
[im 25/40  brain]
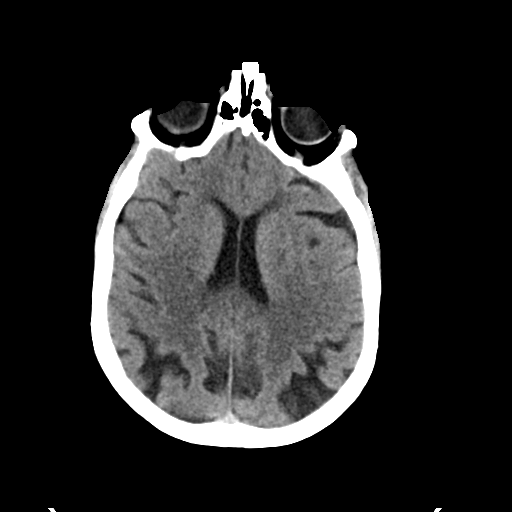
[im 25/40  bone]
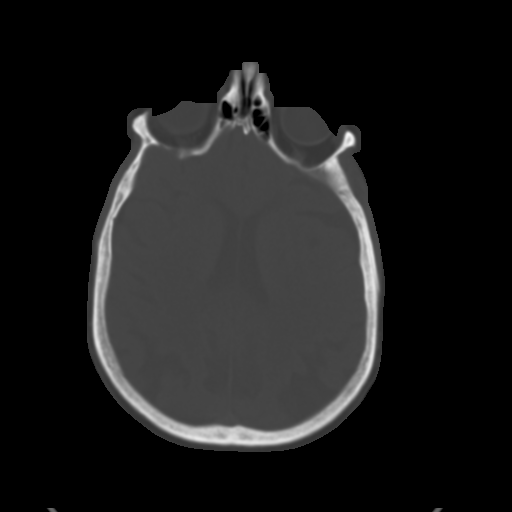
[im 30/40  brain]
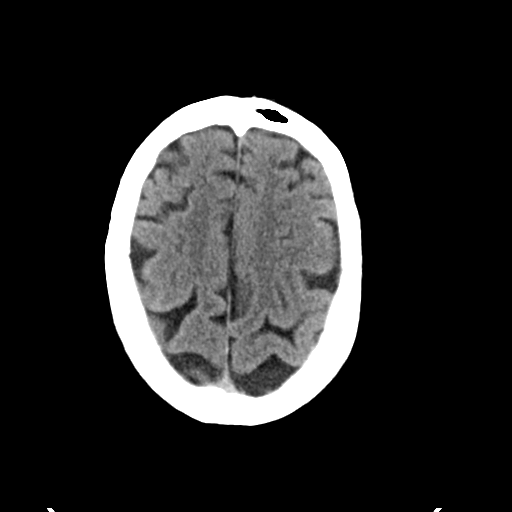
[im 35/40  brain]
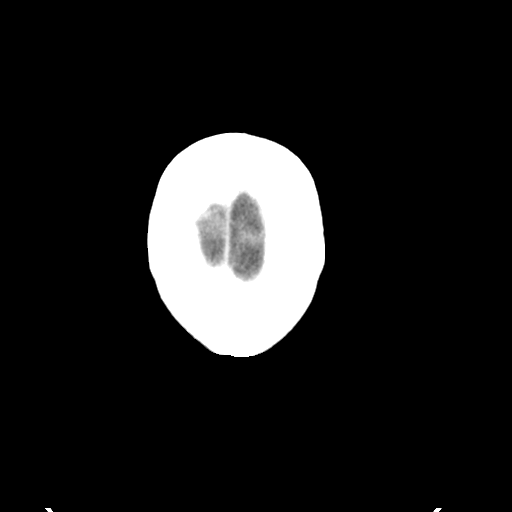

[Series 4: head bone · axial · 0.47mm/px · z∈[-170,-130]mm · 3 of 99 slices shown]
[im 10/99  bone]
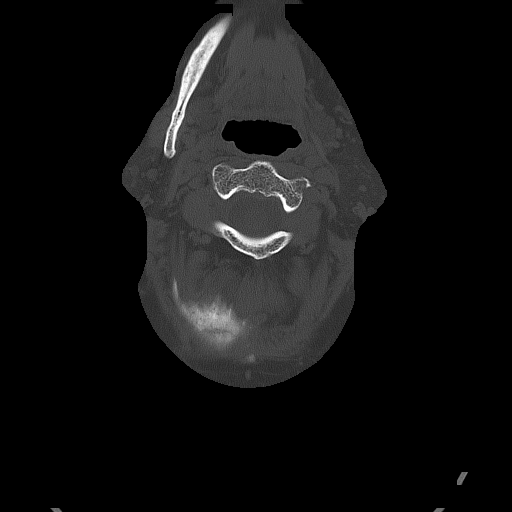
[im 20/99  bone]
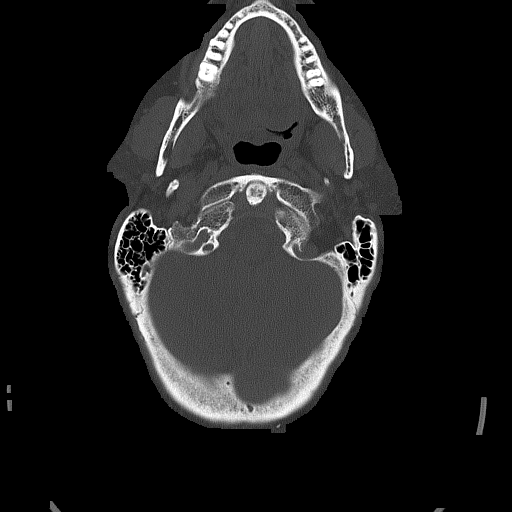
[im 30/99  bone]
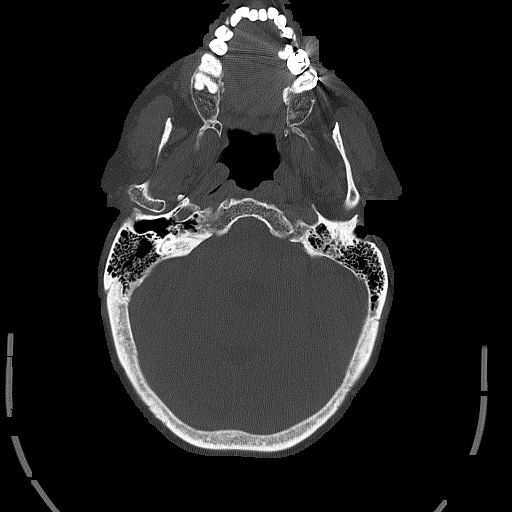

[Series 5: head without cor · coronal · non-contrast · 0.34mm/px · 3 of 70 slices shown]
[im 27/70  brain]
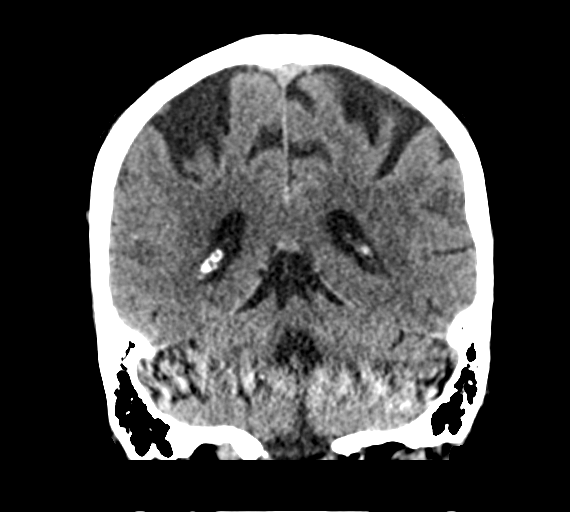
[im 32/70  brain]
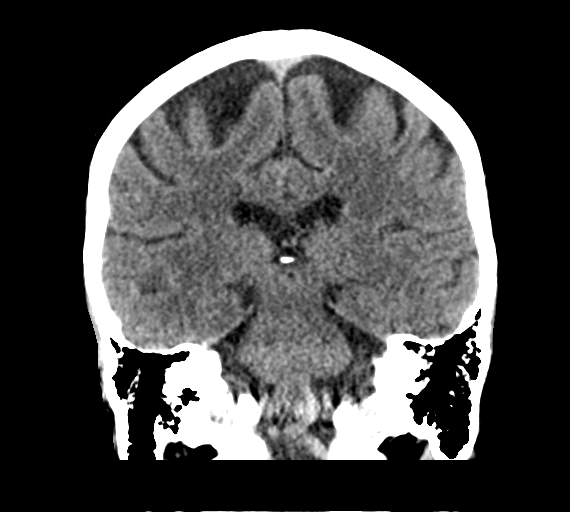
[im 38/70  brain]
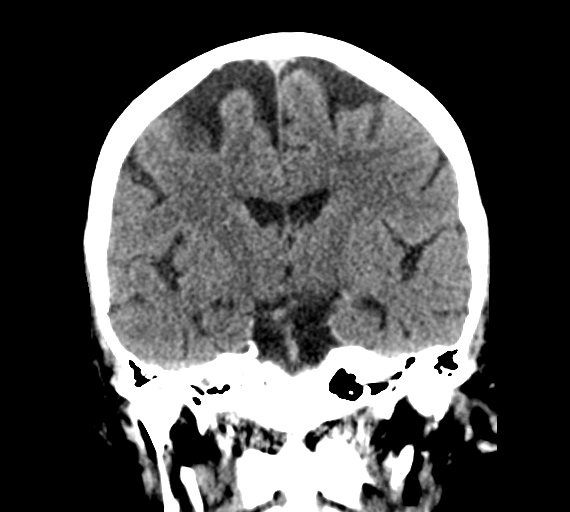

[Series 6: head without sag · sagittal · non-contrast · 0.37mm/px · 3 of 63 slices shown]
[im 21/63  brain]
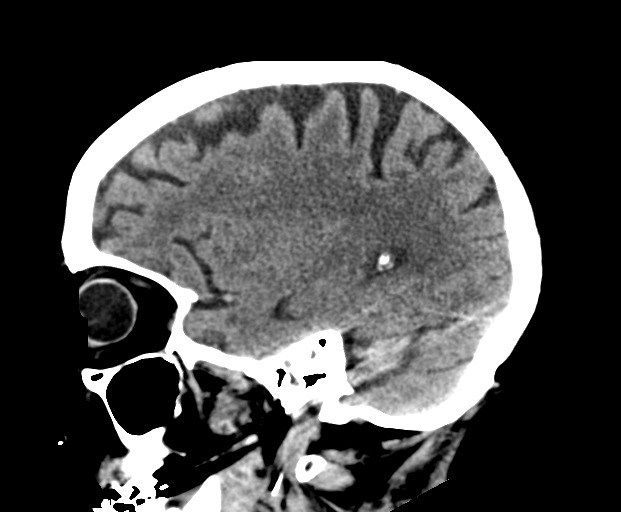
[im 32/63  brain]
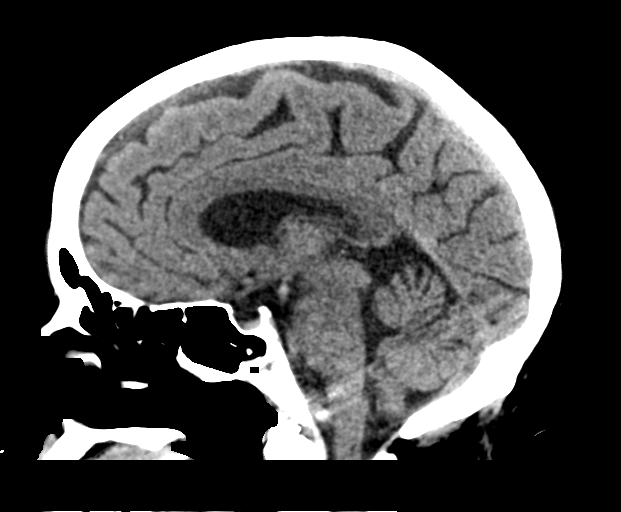
[im 42/63  brain]
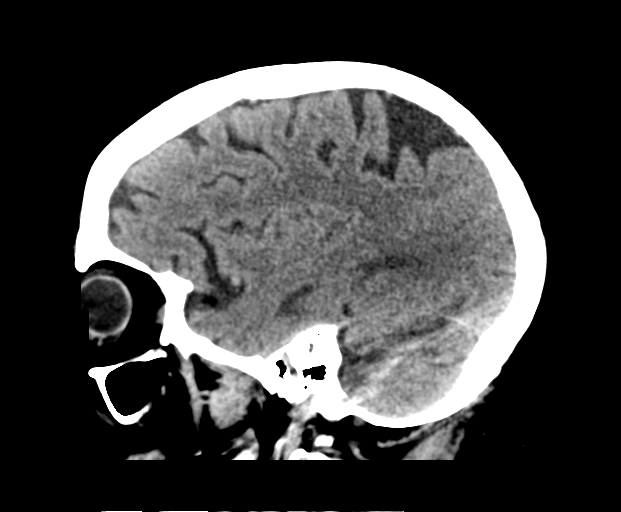

[16 of 47 positions shown; findings below may reference images not displayed]

FINDINGS: Brain: No evidence of acute infarction, hemorrhage, hydrocephalus,
extra-axial collection or mass lesion/mass effect. Symmetric
prominence of the ventricles, cisterns and sulci compatible with
parenchymal volume loss. Patchy areas of white matter
hypoattenuation are most compatible with chronic microvascular
angiopathy.

Vascular: Atherosclerotic calcification of the carotid siphons and
intradural vertebral arteries.

Skull: No calvarial fracture or suspicious osseous lesion. No scalp
swelling or hematoma.

Sinuses/Orbits: Paranasal sinuses and mastoid air cells are
predominantly clear. Orbital structures are unremarkable aside from
prior lens extractions.

Other: None.
IMPRESSION: 1. No acute intracranial abnormality.
2. Generalized parenchymal volume loss and chronic microvascular
ischemic white matter disease.

## 2020-12-20 ENCOUNTER — Ambulatory Visit: Payer: PPO | Admitting: Cardiology

## 2020-12-20 ENCOUNTER — Encounter: Payer: Self-pay | Admitting: Cardiology

## 2020-12-20 ENCOUNTER — Other Ambulatory Visit: Payer: Self-pay

## 2020-12-20 VITALS — BP 143/75 | HR 74 | Ht 65.5 in | Wt 157.8 lb

## 2020-12-20 DIAGNOSIS — I119 Hypertensive heart disease without heart failure: Secondary | ICD-10-CM

## 2020-12-20 DIAGNOSIS — R0609 Other forms of dyspnea: Secondary | ICD-10-CM

## 2020-12-20 DIAGNOSIS — I351 Nonrheumatic aortic (valve) insufficiency: Secondary | ICD-10-CM

## 2020-12-20 DIAGNOSIS — R0600 Dyspnea, unspecified: Secondary | ICD-10-CM

## 2020-12-20 DIAGNOSIS — I447 Left bundle-branch block, unspecified: Secondary | ICD-10-CM | POA: Diagnosis not present

## 2020-12-20 DIAGNOSIS — I493 Ventricular premature depolarization: Secondary | ICD-10-CM

## 2020-12-20 DIAGNOSIS — E7800 Pure hypercholesterolemia, unspecified: Secondary | ICD-10-CM

## 2020-12-20 NOTE — Progress Notes (Signed)
Primary Care Provider: Hoyt Koch, MD Cardiologist: Lisa Hew, MD  -> Formerly patient of Dr. Warren Cox Electrophysiologist: Lisa Meredith Leeds, MD  Clinic Note: Chief Complaint  Patient presents with  . Follow-up    Overall doing much better  . Hypertension    Blood pressure log ranges from 120/84 up to 140/84.  Initial blood pressure today was 153/75.  On recheck was 143/75  . PVCs    Every now and then she feels palpitations   ===================================  ASSESSMENT/PLAN   Problem List Items Addressed This Visit    DOE (dyspnea on exertion) - with throat tightness    No longer having the symptoms.  Not sure what the etiology was, not likely cardiac      Frequent unifocal PVCs (Chronic)    Status post ablation of RVOT PVCs.  She is on high-dose atenolol (did not tolerate carvedilol) Only notes rare palpitations, these could be PVCs from elsewhere versus PACs.      Hypercholesterolemia (Chronic)    Is on low-dose atorvastatin (10 mg every other day).  Most recent lipids in September fairly well controlled with LDL of 90.  Target would be less than 100.      Benign hypertensive heart disease without heart failure - Primary (Chronic)    She has been intolerant of several medications.  She is on ramipril 10 mg twice daily and atenolol 50 mg twice daily.  The twice daily dosing is better for her because she does not have the prolonged effect of the medication and does not the drops.   Hyponatremia with HCTZ, and does not have a lot of edema to warrant Lasix.  Because of prior orthostatic hypotension issues, would be reluctant to be overly aggressive at this point.  Based on her blood pressure log recordings, I think were in the safe range.  Allow for mild permissive hypertension.      Complete left bundle branch block (LBBB) (Chronic)    Most recent EKG done during her visit with Dr. Curt Cox did not show LBBB.  I suspect that this may be a rate  related phenomenon.  Echocardiogram did not necessarily show any septal bounce from LBBB.  Continue to monitor.      Aortic regurgitation (Chronic)    Not heard on exam.  We do want to keep her blood pressure from getting too high. Follow-up echo September 2023        ===================================  HPI:    Lisa Cox is a 75 y.o. female with a PMH notable for Frequent PVCs-s/p (RVOT) PVC Ablation, labile HTN and Moderate Aortic Insufficiency who presents today for 7-month follow-up.   RVOT PVCs: => seen in consultation by Dr. Curt Cox to discuss high burden PVCs. => She did not tolerate multiple medications (diltiazem, flecainide, mexiletine-beta-blockers not effective) and underwent PVC ablation on 08/19/2019.  PVC burden notably reduced.  December 2020-developed LBBB that has normalized.  Labile Hypertension-reluctant to change medications.  HCTZ stopped because of hyponatremia.  She switched back to atenolol from carvedilol  Lisa Cox was last seen on August 04, 2020 with recurrent issues of throat tightness and jaw pain as well as labile blood pressures.  COA states is on the is "going on with her heart.  Notes palpitations off and on but has been noticing constant jaw pain.  Not exertional.  Interestingly, the job pain was present, but the burning sensation in her chest had improved.  Very anxious and nervous.  Concerned about  pericardial effusion on echo (I confirmed with her that this does not mean peritonitis).  Recent Hospitalizations: None   She was seen by Dr. Curt Cox on 10/20/2020: Doing relatively well.  No chest pain or dyspnea.  Unaware palpitations.  Blood pressure was 140/80 4 mmHg.  Reviewed  CV studies:    The following studies were reviewed today: (if available, images/films reviewed: From Epic Chart or Care Everywhere) . n/a:   Interval History:   Lisa Cox presents today overall doing better.  She denies any more jaw pain or burning  sensation in her chest.  She says the palpitations only occur "every once in a while ".  Nothing that concerns her now.  She says her blood pressure recordings have been relatively stable averaging mostly in the 130s over low 80s. She says if she does a lot of exercise she may get short of breath, but is less prominent than before.  Use this is if she overdoes it.  She gets more fatigued at the end of the day and in that case she may get short of breath.  Otherwise, she is in better spirits.  CV Review of Symptoms (Summary): positive for - dyspnea on exertion and that is if she overdoes it.  Rare palpitations negative for - chest pain, edema, orthopnea, paroxysmal nocturnal dyspnea, rapid heart rate, shortness of breath or lightheadedness, syncope/near syncope, TIA/amaurosis fugax, claudication  The patient does not have symptoms concerning for COVID-19 infection (fever, chills, cough, or new shortness of breath).   REVIEWED OF SYSTEMS   Review of Systems  Constitutional: Negative for malaise/fatigue (just not full of Energy) and weight loss.  HENT: Negative for congestion and nosebleeds.   Respiratory: Negative for cough and shortness of breath.   Gastrointestinal: Negative for blood in stool and melena.  Genitourinary: Positive for dysuria (Recent UTI.  Now recovering.). Negative for hematuria.  Musculoskeletal: Positive for joint pain (mild OA pains). Negative for falls.       Walks slowly - better for balance; no falls  Neurological: Negative for dizziness, focal weakness and headaches.  Psychiatric/Behavioral: Negative for depression and memory loss. The patient has insomnia (She often feels that she does not rest well enough.  Notes frequent nocturia and inability to get back to sleep.). The patient is not nervous/anxious.    I have reviewed and (if needed) personally updated the patient's problem list, medications, allergies, past medical and surgical history, social and family history.    PAST MEDICAL HISTORY   Past Medical History:  Diagnosis Date  . Abdominal pain, epigastric 11/12/2019  . Acute cystitis with hematuria 10/29/2019  . Anxiety   . Bell's palsy 2009  . Benign hypertensive heart disease without heart failure 07/11/2011  . Cancer ([place]) Feb.2011   breast,lumpectomy right side followed by radiation  . Depression   . Dysuria 10/29/2019  . Elevated lipase 11/18/2019  . Fatigue 05/26/2019  . Frequent unifocal PVCs 04/02/2017   She was post of had an echocardiogram done which I don't see having been done. At this point, I think with her not having too many symptoms of PVCs we can watch for now and then maybe oracular follow-up.  Marland Kitchen GERD (gastroesophageal reflux disease)   . Hypercholesterolemia 07/11/2011  . Hyperlipidemia   . Hyponatremia 11/18/2019  . Insomnia 04/10/2016  . Moderate aortic regurgitation   . Nausea 11/12/2019  . Personal history of radiation therapy   . PVC (premature ventricular contraction) 10/31/2018   Frequent PVCs with some bigeminy  noted on 48-hour monitor. No arrhythmias.==> s/p RVOT PVC ablation October 2020  . Routine general medical examination at a health care facility 07/12/2017  . UTI symptoms 10/02/2019    PAST SURGICAL HISTORY   Past Surgical History:  Procedure Laterality Date  . BREAST LUMPECTOMY Right Feb. 2011   right breast,followed by radiation therapy  . CARDIAC EVENT MONITOR     Predominantly sinus rhythm with first-degree block. Minimum heart rate 63 bpm. Maximum heart rate 139 bpm. Average is 85 bpm. 17.5% PVCs.  V bigem & Trigem noted.    Marland Kitchen EYE SURGERY  1953   skin removal in eye  . GXT  04/2017   Exercise tolerance test off of beta-blockers: Normal blood pressure response to exercise.  No EKG changes to suggest ischemic changes.  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  Seen at rest, exercise and recovery.  Marland Kitchen NM MYOVIEW LTD  08/2018   LOW RISK. No ischemia or infarction  . PVC ABLATION N/A 08/19/2019    Procedure: PVC ABLATION;  Surgeon: Constance Haw, MD;  Location: [facility];  Service: Cardiovascular;  Laterality: N/A;  . TRANSTHORACIC ECHOCARDIOGRAM  08/2018; 06/2020   a) EF 55-60%.  No RWMA. Gr 2 DD. Mod AI.; b) Normal LV size and function.  EF 55-60%.  Moderate asymmetric with basal septal hypertrophy.  Indeterminate diastolic pressures.  Small pericardial effusion, moderate AI.  Normal CVP.  Marland Kitchen TUBAL LIGATION  1978    Immunization History  Administered Date(s) Administered  . Fluad Quad(high Dose 65+) 07/01/2019  . Influenza, High Dose Seasonal PF 07/12/2017, 08/05/2018, 08/23/2020  . Influenza,inj,Quad PF,6+ Mos 07/30/2016  . Influenza-Unspecified 08/22/2013  . PFIZER(Purple Top)SARS-COV-2 Vaccination 11/11/2019, 12/02/2019, 08/10/2020  . Pneumococcal Polysaccharide-23 07/12/2017    MEDICATIONS/ALLERGIES   Current Meds  Medication Sig  . aspirin 81 MG tablet Take 81 mg by mouth every evening.  Marland Kitchen atenolol (TENORMIN) 50 MG tablet TAKE 1 TABLET BY MOUTH TWICE A DAY  . atorvastatin (LIPITOR) 20 MG tablet Take 10 mg by mouth every other day.  . carboxymethylcellulose (REFRESH PLUS) 0.5 % SOLN Place 1 drop into both eyes 3 (three) times daily as needed (dry eye).   . cephALEXin (KEFLEX) 250 MG capsule Take by mouth.  . Cholecalciferol (VITAMIN D3) 50 MCG (2000 UT) TABS Take 2,000 Units by mouth daily.   Marland Kitchen dimenhyDRINATE (DRAMAMINE) 50 MG tablet Take 50 mg by mouth at bedtime. Must take with Lorazepam  . famotidine (PEPCID) 10 MG tablet Take 10 mg by mouth at bedtime as needed for heartburn or indigestion.   Marland Kitchen ibuprofen (ADVIL) 200 MG tablet Take 600 mg by mouth every 6 (six) hours as needed for headache.  Marland Kitchen LORazepam (ATIVAN) 1 MG tablet TAKE 1 TABLET BY MOUTH EVERYDAY AT BEDTIME  . Multiple Vitamins-Minerals (PRESERVISION AREDS 2) CAPS Take 1 capsule by mouth 2 (two) times daily.  . ramipril (ALTACE) 10 MG capsule TAKE 2 CAPSULES BY MOUTH DAILY  . sodium chloride  (OCEAN) 0.65 % SOLN nasal spray Place 1 spray into both nostrils as needed for congestion.    Allergies  Allergen Reactions  . Hctz [Hydrochlorothiazide] Other (See Comments)    Patient developed significant hyponatremia shortly after starting.  Would also include chlorthalidone  . Amlodipine Other (See Comments)    Peripheral edema  . Crestor [Rosuvastatin Calcium] Other (See Comments)    Leg cramps  . Erythromycin Nausea Only  . Macrobid [Nitrofurantoin Monohyd Macro] Nausea And Vomiting  . Niacin And  Related Itching  . Restoril Other (See Comments)    Reaction-"nervous"  . Zolpidem Tartrate     Reaction-"nervous"    SOCIAL HISTORY/FAMILY HISTORY   Reviewed in Epic:  Pertinent findings:  Social History   Tobacco Use  . Smoking status: Never Smoker  . Smokeless tobacco: Never Used  Substance Use Topics  . Alcohol use: No  . Drug use: No   Social History   Social History Narrative  . Not on file    OBJCTIVE -PE, EKG, labs   Wt Readings from Last 3 Encounters:  12/20/20 157 lb 12.8 oz (71.6 kg)  11/25/20 154 lb 12.8 oz (70.2 kg)  10/20/20 155 lb 9.6 oz (70.6 kg)   Home blood pressure readings per log: 124/84 mmHg-140/84 mmHg Physical Exam: BP (!) 143/75   Pulse 74   Ht 5' 5.5" (1.664 m)   Wt 157 lb 12.8 oz (71.6 kg)   SpO2 97%   BMI 25.86 kg/m   Physical Exam Vitals reviewed.  Constitutional:      General: She is not in acute distress (Just anxious as usual).    Appearance: Normal appearance. She is normal weight. She is not ill-appearing or toxic-appearing.  HENT:     Head: Normocephalic and atraumatic.  Neck:     Vascular: No carotid bruit, hepatojugular reflux or JVD.  Cardiovascular:     Rate and Rhythm: Normal rate and regular rhythm. Occasional extrasystoles are present.    Chest Wall: PMI is not displaced.     Pulses: Normal pulses.     Heart sounds: S1 normal and S2 normal. Heart sounds are distant. No murmur heard. No gallop. No S4  sounds.   Pulmonary:     Effort: Pulmonary effort is normal. No respiratory distress.     Breath sounds: Normal breath sounds.  Chest:     Chest wall: No tenderness.  Musculoskeletal:        General: Swelling (Trivial) present. Normal range of motion.     Cervical back: Normal range of motion and neck supple.  Skin:    General: Skin is warm and dry.  Neurological:     General: No focal deficit present.     Mental Status: She is alert and oriented to person, place, and time.  Psychiatric:        Mood and Affect: Mood normal.        Behavior: Behavior normal.        Thought Content: Thought content normal.        Judgment: Judgment normal.     Comments: Much less anxious than before.     Adult ECG Report n/a  Recent Labs:  Reviewed - no new labs.   Lab Results  Component Value Date   CHOL 158 06/23/2020   HDL 45 06/23/2020   LDLCALC 90 06/23/2020   TRIG 126 06/23/2020   CHOLHDL 3.5 06/23/2020   Lab Results  Component Value Date   CREATININE 0.95 06/23/2020   BUN 13 06/23/2020   NA 136 06/23/2020   K 4.4 06/23/2020   CL 97 06/23/2020   CO2 26 06/23/2020   CBC Latest Ref Rng & Units 11/16/2019 11/13/2019 07/22/2019  WBC 4.0 - 10.5 K/uL 8.2 8.9 8.8  Hemoglobin 12.0 - 15.0 g/dL 12.8 13.5 13.4  Hematocrit 36.0 - 46.0 % 38.0 39.3 40.2  Platelets 150.0 - 400.0 K/uL 189.0 211.0 185    Lab Results  Component Value Date   TSH 3.43 11/16/2019    ==================================================  COVID-19 Education: The signs and symptoms of COVID-19 were discussed with the patient and how to seek care for testing (follow up with PCP or arrange E-visit).   The importance of social distancing and COVID-19 vaccination was discussed today. The patient is practicing social distancing & Masking.   I spent a total of 68minutes with the patient spent in direct patient consultation.  Additional time spent with chart review  / charting (studies, outside notes, etc): 12  min Total Time: 36  min  Current medicines are reviewed at length with the patient today.  (+/- concerns) n/a  This visit occurred during the SARS-CoV-2 public health emergency.  Safety protocols were in place, including screening questions prior to the visit, additional usage of staff PPE, and extensive cleaning of exam room while observing appropriate contact time as indicated for disinfecting solutions.  Notice: This dictation was prepared with Dragon dictation along with smaller phrase technology. Any transcriptional errors that result from this process are unintentional and may not be corrected upon review.  Patient Instructions / Medication Changes & Studies & Tests Ordered   Patient Instructions  Medication Instructions:   Not needed  *If you need a refill on your cardiac medications before your next appointment, please call your pharmacy*   Lab Work:  Not needed   Testing/Procedures: Not needed   Follow-Up: At [facility], you and your health needs are our priority.  As part of our continuing mission to provide you with exceptional heart care, we have created designated Provider Care Teams.  These Care Teams include your primary Cardiologist (physician) and Advanced Practice Providers (APPs -  Physician Assistants and Nurse Practitioners) who all work together to provide you with the care you need, when you need it.     Your next appointment:   12 month(s)  The format for your next appointment:   In Person  Provider:   Glenetta Hew, MD      Studies Ordered:   No orders of the defined types were placed in this encounter.    Lisa Cox, M.D., M.S. Interventional Cardiologist   Pager # [phone] Phone # [phone] [place], [place] [address]   Thank you for choosing Heartcare at [facility]!!

## 2020-12-20 NOTE — Patient Instructions (Signed)
Medication Instructions:  ?Not needed ? ?*If you need a refill on your cardiac medications before your next appointment, please call your pharmacy* ? ? ?Lab Work: ?Not needed ? ? ? ?Testing/Procedures: ? ?Not needed ? ?Follow-Up: ?At [facility], you and your health needs are our priority.  As part of our continuing mission to provide you with exceptional heart care, we have created designated Provider Care Teams.  These Care Teams include your primary Cardiologist (physician) and Advanced Practice Providers (APPs -  Physician Assistants and Nurse Practitioners) who all work together to provide you with the care you need, when you need it. ? ?  ? ?Your next appointment:   ?12 month(s) ? ?The format for your next appointment:   ?In Person ? ?Provider:   ?David Harding, MD  ? ?

## 2020-12-30 ENCOUNTER — Telehealth: Payer: Self-pay | Admitting: Internal Medicine

## 2020-12-30 DIAGNOSIS — R300 Dysuria: Secondary | ICD-10-CM

## 2020-12-30 MED ORDER — CIPROFLOXACIN HCL 500 MG PO TABS: 500.0000 mg | ORAL_TABLET | Freq: Two times a day (BID) | ORAL | 0 refills | Status: DC

## 2020-12-30 NOTE — Addendum Note (Signed)
Addended by: Pricilla Holm A on: 12/30/2020 04:03 PM   Modules accepted: Orders

## 2020-12-30 NOTE — Telephone Encounter (Signed)
Patient calling to report frequent, burning, painful urination.  Can urine test be ordered for her?  Please call patient

## 2020-12-30 NOTE — Telephone Encounter (Signed)
See below

## 2020-12-30 NOTE — Telephone Encounter (Signed)
Sent in cipro

## 2020-12-30 NOTE — Telephone Encounter (Signed)
Patient tested positive for COVID today; home test. Patient requesting Cipro for UTI She has runny nose and cough

## 2020-12-30 NOTE — Telephone Encounter (Signed)
Order placed can come to lab.

## 2020-12-30 NOTE — Telephone Encounter (Signed)
Ok to place order? Please advise 

## 2021-01-15 ENCOUNTER — Encounter: Payer: Self-pay | Admitting: Cardiology

## 2021-01-15 NOTE — Assessment & Plan Note (Signed)
She has been intolerant of several medications.  She is on ramipril 10 mg twice daily and atenolol 50 mg twice daily.  The twice daily dosing is better for her because she does not have the prolonged effect of the medication and does not the drops.   Hyponatremia with HCTZ, and does not have a lot of edema to warrant Lasix.  Because of prior orthostatic hypotension issues, would be reluctant to be overly aggressive at this point.  Based on her blood pressure log recordings, I think were in the safe range.  Allow for mild permissive hypertension.

## 2021-01-15 NOTE — Assessment & Plan Note (Signed)
Is on low-dose atorvastatin (10 mg every other day).  Most recent lipids in September fairly well controlled with LDL of 90.  Target would be less than 100.

## 2021-01-15 NOTE — Assessment & Plan Note (Signed)
Status post ablation of RVOT PVCs.  She is on high-dose atenolol (did not tolerate carvedilol) Only notes rare palpitations, these could be PVCs from elsewhere versus PACs.

## 2021-01-15 NOTE — Assessment & Plan Note (Signed)
Most recent EKG done during her visit with Dr. Curt Bears did not show LBBB.  I suspect that this may be a rate related phenomenon.  Echocardiogram did not necessarily show any septal bounce from LBBB.  Continue to monitor.

## 2021-01-15 NOTE — Assessment & Plan Note (Signed)
No longer having the symptoms.  Not sure what the etiology was, not likely cardiac

## 2021-01-15 NOTE — Assessment & Plan Note (Signed)
Not heard on exam.  We do want to keep her blood pressure from getting too high. Follow-up echo September 2023

## 2021-02-07 ENCOUNTER — Other Ambulatory Visit: Payer: Self-pay | Admitting: Cardiology

## 2021-02-08 DIAGNOSIS — N3020 Other chronic cystitis without hematuria: Secondary | ICD-10-CM | POA: Diagnosis not present

## 2021-02-23 ENCOUNTER — Encounter: Payer: Self-pay | Admitting: Internal Medicine

## 2021-02-23 ENCOUNTER — Other Ambulatory Visit: Payer: Self-pay

## 2021-02-23 ENCOUNTER — Ambulatory Visit (INDEPENDENT_AMBULATORY_CARE_PROVIDER_SITE_OTHER): Payer: PPO | Admitting: Internal Medicine

## 2021-02-23 VITALS — BP 142/78 | HR 68 | Temp 98.1°F | Wt 155.0 lb

## 2021-02-23 DIAGNOSIS — N3000 Acute cystitis without hematuria: Secondary | ICD-10-CM | POA: Diagnosis not present

## 2021-02-23 DIAGNOSIS — I10 Essential (primary) hypertension: Secondary | ICD-10-CM

## 2021-02-23 DIAGNOSIS — R300 Dysuria: Secondary | ICD-10-CM | POA: Diagnosis not present

## 2021-02-23 LAB — POCT URINALYSIS DIPSTICK
Bilirubin, UA: NEGATIVE
Glucose, UA: NEGATIVE
Ketones, UA: NEGATIVE
Nitrite, UA: NEGATIVE
Protein, UA: NEGATIVE
Spec Grav, UA: 1.015 (ref 1.010–1.025)
Urobilinogen, UA: 0.2 E.U./dL
pH, UA: 6 (ref 5.0–8.0)

## 2021-02-23 MED ORDER — CIPROFLOXACIN HCL 250 MG PO TABS: ORAL_TABLET | ORAL | 0 refills | Status: DC

## 2021-02-23 NOTE — Progress Notes (Signed)
Subjective:  Patient ID: Lisa Cox, female    DOB: Jun 18, 1946  Age: 75 y.o. MRN: [id number]  CC: Urinary Tract Infection   HPI CHEMEKA FILICE presents for recurrent UTI  E coli - pt took Keflex for a UTI - it did not help. Sx's came back on Tue. On average she gets for episode of cystitis per year Cipro worked in the past  Outpatient Medications Prior to Visit  Medication Sig Dispense Refill  . aspirin 81 MG tablet Take 81 mg by mouth every evening.    Marland Kitchen atenolol (TENORMIN) 50 MG tablet TAKE 1 TABLET BY MOUTH TWICE A DAY 180 tablet 3  . atorvastatin (LIPITOR) 20 MG tablet TAKE 1 TABLET BY MOUTH EVERY DAY 90 tablet 3  . carboxymethylcellulose (REFRESH PLUS) 0.5 % SOLN Place 1 drop into both eyes 3 (three) times daily as needed (dry eye).     . Cholecalciferol (VITAMIN D3) 50 MCG (2000 UT) TABS Take 2,000 Units by mouth daily.     Marland Kitchen dimenhyDRINATE (DRAMAMINE) 50 MG tablet Take 50 mg by mouth at bedtime. Must take with Lorazepam    . famotidine (PEPCID) 10 MG tablet Take 10 mg by mouth at bedtime as needed for heartburn or indigestion.     Marland Kitchen ibuprofen (ADVIL) 200 MG tablet Take 600 mg by mouth every 6 (six) hours as needed for headache.    Marland Kitchen LORazepam (ATIVAN) 1 MG tablet TAKE 1 TABLET BY MOUTH EVERYDAY AT BEDTIME 30 tablet 5  . Multiple Vitamins-Minerals (PRESERVISION AREDS 2) CAPS Take 1 capsule by mouth 2 (two) times daily.    . ramipril (ALTACE) 10 MG capsule TAKE 2 CAPSULES BY MOUTH DAILY 180 capsule 3  . sodium chloride (OCEAN) 0.65 % SOLN nasal spray Place 1 spray into both nostrils as needed for congestion.    . cephALEXin (KEFLEX) 250 MG capsule Take by mouth. (Patient not taking: Reported on 02/23/2021)    . ciprofloxacin (CIPRO) 500 MG tablet Take 1 tablet (500 mg total) by mouth 2 (two) times daily. (Patient not taking: Reported on 02/23/2021) 6 tablet 0   No facility-administered medications prior to visit.    ROS: Review of Systems  Constitutional: Negative for  activity change, appetite change, chills, fatigue and unexpected weight change.  HENT: Negative for congestion, mouth sores and sinus pressure.   Eyes: Negative for visual disturbance.  Respiratory: Negative for cough and chest tightness.   Gastrointestinal: Negative for abdominal pain and nausea.  Genitourinary: Positive for dysuria, frequency and urgency. Negative for difficulty urinating and vaginal pain.  Musculoskeletal: Negative for back pain and gait problem.  Skin: Negative for pallor and rash.  Neurological: Negative for dizziness, tremors, weakness, numbness and headaches.  Psychiatric/Behavioral: Negative for confusion and sleep disturbance.    Objective:  BP (!) 142/78 (BP Location: Left Arm)   Pulse 68   Temp 98.1 F (36.7 C) (Oral)   Wt 155 lb (70.3 kg)   SpO2 97%   BMI 25.40 kg/m   BP Readings from Last 3 Encounters:  02/23/21 (!) 142/78  12/20/20 (!) 143/75  11/25/20 124/60    Wt Readings from Last 3 Encounters:  02/23/21 155 lb (70.3 kg)  12/20/20 157 lb 12.8 oz (71.6 kg)  11/25/20 154 lb 12.8 oz (70.2 kg)    Physical Exam Constitutional:      General: She is not in acute distress.    Appearance: She is well-developed.  HENT:     Head: Normocephalic.  Right Ear: External ear normal.     Left Ear: External ear normal.     Nose: Nose normal.  Eyes:     General:        Right eye: No discharge.        Left eye: No discharge.     Conjunctiva/sclera: Conjunctivae normal.     Pupils: Pupils are equal, round, and reactive to light.  Neck:     Thyroid: No thyromegaly.     Vascular: No JVD.     Trachea: No tracheal deviation.  Cardiovascular:     Rate and Rhythm: Normal rate and regular rhythm.     Heart sounds: Normal heart sounds.  Pulmonary:     Effort: No respiratory distress.     Breath sounds: No stridor. No wheezing.  Abdominal:     General: Bowel sounds are normal. There is no distension.     Palpations: Abdomen is soft. There is no mass.      Tenderness: There is no abdominal tenderness. There is no guarding or rebound.  Musculoskeletal:        General: No tenderness.     Cervical back: Normal range of motion and neck supple.  Lymphadenopathy:     Cervical: No cervical adenopathy.  Skin:    Findings: No erythema or rash.  Neurological:     Cranial Nerves: No cranial nerve deficit.     Motor: No abnormal muscle tone.     Coordination: Coordination normal.     Deep Tendon Reflexes: Reflexes normal.  Psychiatric:        Behavior: Behavior normal.        Thought Content: Thought content normal.        Judgment: Judgment normal.     Lab Results  Component Value Date   WBC 8.2 11/16/2019   HGB 12.8 11/16/2019   HCT 38.0 11/16/2019   PLT 189.0 11/16/2019   GLUCOSE 100 (H) 06/23/2020   CHOL 158 06/23/2020   TRIG 126 06/23/2020   HDL 45 06/23/2020   LDLCALC 90 06/23/2020   ALT 17 06/23/2020   AST 19 06/23/2020   NA 136 06/23/2020   K 4.4 06/23/2020   CL 97 06/23/2020   CREATININE 0.95 06/23/2020   BUN 13 06/23/2020   CO2 26 06/23/2020   TSH 3.43 11/16/2019   INR 0.9 05/09/2008   HGBA1C 5.4 05/26/2019    MM 3D SCREEN BREAST BILATERAL  Result Date: 05/04/2020 CLINICAL DATA:  Screening. EXAM: DIGITAL SCREENING BILATERAL MAMMOGRAM WITH TOMO AND CAD COMPARISON:  Previous exam(s). ACR Breast Density Category b: There are scattered areas of fibroglandular density. FINDINGS: There are no findings suspicious for malignancy. Images were processed with CAD. IMPRESSION: No mammographic evidence of malignancy. A result letter of this screening mammogram will be mailed directly to the patient. RECOMMENDATION: Screening mammogram in one year. (Code:SM-B-01Y) BI-RADS CATEGORY  1: Negative. Electronically Signed   By: Curlene Dolphin M.D.   On: 05/04/2020 11:06    Assessment & Plan:     Follow-up: No follow-ups on file.  Walker Kehr, MD

## 2021-02-25 ENCOUNTER — Encounter: Payer: Self-pay | Admitting: Internal Medicine

## 2021-02-25 NOTE — Assessment & Plan Note (Addendum)
Frequent UTIs - E. coli.  Continue exacerbation.  She failed Keflex.  Cipro usually helps.  I gave Lisa Cox a prescription for Cipro to take 1 twice a day for 3 days as needed UTI episode.  Tripped over her 4-year.  Follow-up with Dr. Sharlet Salina

## 2021-02-25 NOTE — Assessment & Plan Note (Signed)
Continue with ramipril

## 2021-02-25 NOTE — Assessment & Plan Note (Signed)
New.  Cipro prescribed.  Tylenol as needed

## 2021-03-07 DIAGNOSIS — D225 Melanocytic nevi of trunk: Secondary | ICD-10-CM | POA: Diagnosis not present

## 2021-03-07 DIAGNOSIS — Z1283 Encounter for screening for malignant neoplasm of skin: Secondary | ICD-10-CM | POA: Diagnosis not present

## 2021-03-08 DIAGNOSIS — Z961 Presence of intraocular lens: Secondary | ICD-10-CM | POA: Diagnosis not present

## 2021-03-08 DIAGNOSIS — H353131 Nonexudative age-related macular degeneration, bilateral, early dry stage: Secondary | ICD-10-CM | POA: Diagnosis not present

## 2021-03-08 DIAGNOSIS — H52203 Unspecified astigmatism, bilateral: Secondary | ICD-10-CM | POA: Diagnosis not present

## 2021-03-16 ENCOUNTER — Telehealth: Payer: Self-pay | Admitting: Internal Medicine

## 2021-03-16 NOTE — Progress Notes (Signed)
  Chronic Care Management   Outreach Note  03/16/2021 Name: Lisa Cox MRN: [id number] DOB: 06-Sep-1946  Referred by: Hoyt Koch, MD Reason for referral : No chief complaint on file.   An unsuccessful telephone outreach was attempted today. The patient was referred to the pharmacist for assistance with care management and care coordination.   Follow Up Plan:   Lauretta Grill Upstream Scheduler

## 2021-03-17 ENCOUNTER — Telehealth: Payer: Self-pay | Admitting: Internal Medicine

## 2021-03-17 NOTE — Chronic Care Management (AMB) (Signed)
  Chronic Care Management   Note  03/17/2021 Name: [name] MRN: [id number] DOB: 14-Sep-1946  Lisa Cox is a 75 y.o. year old female who is a primary care patient of Hoyt Koch, MD. I reached out to Lisa Cox by phone today in response to a referral sent by Ms. Cherisse A Eppard's PCP, Hoyt Koch, MD.   Ms. Herskowitz was given information about Chronic Care Management services today including:  1. CCM service includes personalized support from designated clinical staff supervised by her physician, including individualized plan of care and coordination with other care providers 2. 24/7 contact phone numbers for assistance for urgent and routine care needs. 3. Service will only be billed when office clinical staff spend 20 minutes or more in a month to coordinate care. 4. Only one practitioner may furnish and bill the service in a calendar month. 5. The patient may stop CCM services at any time (effective at the end of the month) by phone call to the office staff.   Patient agreed to services and verbal consent obtained.   Follow up plan:   Lauretta Grill Upstream Scheduler

## 2021-04-07 ENCOUNTER — Other Ambulatory Visit: Payer: Self-pay | Admitting: Internal Medicine

## 2021-04-07 DIAGNOSIS — Z1231 Encounter for screening mammogram for malignant neoplasm of breast: Secondary | ICD-10-CM

## 2021-04-28 ENCOUNTER — Ambulatory Visit: Payer: PPO

## 2021-05-12 ENCOUNTER — Other Ambulatory Visit: Payer: Self-pay | Admitting: Internal Medicine

## 2021-06-02 ENCOUNTER — Other Ambulatory Visit: Payer: Self-pay

## 2021-06-02 ENCOUNTER — Ambulatory Visit
Admission: RE | Admit: 2021-06-02 | Discharge: 2021-06-02 | Disposition: A | Discharge status: Home / Self Care | Payer: PPO | Source: Ambulatory Visit | Attending: Internal Medicine | Admitting: Internal Medicine

## 2021-06-02 DIAGNOSIS — Z1231 Encounter for screening mammogram for malignant neoplasm of breast: Secondary | ICD-10-CM

## 2021-06-06 ENCOUNTER — Telehealth: Payer: Self-pay

## 2021-06-06 NOTE — Chronic Care Management (AMB) (Signed)
    Chronic Care Management Pharmacy Assistant    Name: [name]            MRN: [id number]        DOB: 01-15-1946   AANIKA Cox is an 75 y.o. year old female who presents for his initial CCM visit with the clinical pharmacist.     Recent office visits:  02/23/21-Alekesi V. Plotnikov, MD (PCP) Seen for Urinary tract infection. Start on CIPRO 250 MG tablet.   Recent consult visits:  03/08/21-Christine McCuen (Ophthalmology) Notes not available. 03/07/21-John Nevada Crane (Dermatology) Notes not available. 02/08/21-Larry Ludger Nutting (Nurse practitioner) Notes not available. 12/20/20-David Loren Racer, MD (Cardiology) Follow up visit.   Hospital visits:  None in previous 6 months  Medications: Outpatient Encounter Medications as of 06/06/2021  Medication Sig   aspirin 81 MG tablet Take 81 mg by mouth every evening.   atenolol (TENORMIN) 50 MG tablet TAKE 1 TABLET BY MOUTH TWICE A DAY   atorvastatin (LIPITOR) 20 MG tablet TAKE 1 TABLET BY MOUTH EVERY DAY   carboxymethylcellulose (REFRESH PLUS) 0.5 % SOLN Place 1 drop into both eyes 3 (three) times daily as needed (dry eye).    Cholecalciferol (VITAMIN D3) 50 MCG (2000 UT) TABS Take 2,000 Units by mouth daily.    ciprofloxacin (CIPRO) 250 MG tablet Take 1 po bid x 3 d prn cystitis   dimenhyDRINATE (DRAMAMINE) 50 MG tablet Take 50 mg by mouth at bedtime. Must take with Lorazepam   famotidine (PEPCID) 10 MG tablet Take 10 mg by mouth at bedtime as needed for heartburn or indigestion.    ibuprofen (ADVIL) 200 MG tablet Take 600 mg by mouth every 6 (six) hours as needed for headache.   LORazepam (ATIVAN) 1 MG tablet TAKE 1 TABLET BY MOUTH EVERYDAY AT BEDTIME   Multiple Vitamins-Minerals (PRESERVISION AREDS 2) CAPS Take 1 capsule by mouth 2 (two) times daily.   ramipril (ALTACE) 10 MG capsule TAKE 2 CAPSULES BY MOUTH DAILY   sodium chloride (OCEAN) 0.65 % SOLN nasal spray Place 1 spray into both nostrils as needed for congestion.   No  facility-administered encounter medications on file as of 06/06/2021.    No facility-administered encounter medications on file as of 06/06/2021.    Aspirin 81 MG tablet Last filled:None noted Atenolol (TENORMIN) 50 MG tablet Last filled:03/17/21 90 DS Atorvastatin (LIPITOR) 20 MG tablet Last filled:02/08/21 90 DS Carboxymethylcellulose (REFRESH PLUS) 0.5 % SOLN Last filled:None noted Cholecalciferol (VITAMIN D3) 50 MCG (2000 UT) TABS Last filled:None noted Ciprofloxacin (CIPRO) 250 MG tablet Last filled:02/23/21 12 DS DimenhyDRINATE (DRAMAMINE) 50 MG tablet Last filled:None noted Famotidine (PEPCID) 10 MG tablet Last filled:None noted Ibuprofen (ADVIL) 200 MG tablet Last filled:None noted LORazepam (ATIVAN) 1 MG tablet Last filled:05/16/21 30 DS Ramipril (ALTACE) 10 MG capsule Last filled:05/06/21 90 DS Sodium chloride (OCEAN) 0.65 % SOLN nasal spray Last filled:Non noted   Star Rating Drugs: Atorvastatin (LIPITOR) 20 MG tablet Last filled:02/08/21 90 DS Ramipril (ALTACE) 10 MG capsule Last filled:05/06/21 90 DS   Myriam Elta [place], [place]

## 2021-06-06 NOTE — Progress Notes (Deleted)
    Chronic Care Management Pharmacy Assistant   Name: Lisa Cox  MRN: [id number] DOB: Jan 05, 1946  Lisa Cox is an 75 y.o. year old female who presents for his initial CCM visit with the clinical pharmacist.   Recent office visits:  02/23/21-Alekesi V. Plotnikov, MD (PCP) Seen for Urinary tract infection. Start on CIPRO 250 MG tablet.  Recent consult visits:  03/08/21-Christine McCuen (Ophthalmology) Notes not available. 03/07/21-John Nevada Crane (Dermatology) Notes not available. 02/08/21-Larry Ludger Nutting (Nurse practitioner) Notes not available. 12/20/20-David Loren Racer, MD (Cardiology) Follow up visit.  Hospital visits:  None in previous 6 months  Medications: Outpatient Encounter Medications as of 06/06/2021  Medication Sig   aspirin 81 MG tablet Take 81 mg by mouth every evening.   atenolol (TENORMIN) 50 MG tablet TAKE 1 TABLET BY MOUTH TWICE A DAY   atorvastatin (LIPITOR) 20 MG tablet TAKE 1 TABLET BY MOUTH EVERY DAY   carboxymethylcellulose (REFRESH PLUS) 0.5 % SOLN Place 1 drop into both eyes 3 (three) times daily as needed (dry eye).    Cholecalciferol (VITAMIN D3) 50 MCG (2000 UT) TABS Take 2,000 Units by mouth daily.    ciprofloxacin (CIPRO) 250 MG tablet Take 1 po bid x 3 d prn cystitis   dimenhyDRINATE (DRAMAMINE) 50 MG tablet Take 50 mg by mouth at bedtime. Must take with Lorazepam   famotidine (PEPCID) 10 MG tablet Take 10 mg by mouth at bedtime as needed for heartburn or indigestion.    ibuprofen (ADVIL) 200 MG tablet Take 600 mg by mouth every 6 (six) hours as needed for headache.   LORazepam (ATIVAN) 1 MG tablet TAKE 1 TABLET BY MOUTH EVERYDAY AT BEDTIME   Multiple Vitamins-Minerals (PRESERVISION AREDS 2) CAPS Take 1 capsule by mouth 2 (two) times daily.   ramipril (ALTACE) 10 MG capsule TAKE 2 CAPSULES BY MOUTH DAILY   sodium chloride (OCEAN) 0.65 % SOLN nasal spray Place 1 spray into both nostrils as needed for congestion.   No facility-administered  encounter medications on file as of 06/06/2021.   Aspirin 81 MG tablet Last filled:None noted Atenolol (TENORMIN) 50 MG tablet Last filled:03/17/21 90 DS Atorvastatin (LIPITOR) 20 MG tablet Last filled:02/08/21 90 DS Carboxymethylcellulose (REFRESH PLUS) 0.5 % SOLN Last filled:None noted Cholecalciferol (VITAMIN D3) 50 MCG (2000 UT) TABS Last filled:None noted Ciprofloxacin (CIPRO) 250 MG tablet Last filled:02/23/21 12 DS DimenhyDRINATE (DRAMAMINE) 50 MG tablet Last filled:None noted Famotidine (PEPCID) 10 MG tablet Last filled:None noted Ibuprofen (ADVIL) 200 MG tablet Last filled:None noted LORazepam (ATIVAN) 1 MG tablet Last filled:05/16/21 30 DS Ramipril (ALTACE) 10 MG capsule Last filled:05/06/21 90 DS Sodium chloride (OCEAN) 0.65 % SOLN nasal spray Last filled:Non noted  Star Rating Drugs: Atorvastatin (LIPITOR) 20 MG tablet Last filled:02/08/21 90 DS Ramipril (ALTACE) 10 MG capsule Last filled:05/06/21 90 DS  Myriam Elta [place], [place]

## 2021-06-15 ENCOUNTER — Other Ambulatory Visit: Payer: Self-pay | Admitting: Cardiology

## 2021-06-20 ENCOUNTER — Ambulatory Visit (INDEPENDENT_AMBULATORY_CARE_PROVIDER_SITE_OTHER): Payer: PPO | Admitting: Internal Medicine

## 2021-06-20 ENCOUNTER — Other Ambulatory Visit: Payer: Self-pay

## 2021-06-20 ENCOUNTER — Encounter: Payer: Self-pay | Admitting: Internal Medicine

## 2021-06-20 VITALS — BP 124/70 | HR 61 | Temp 97.8°F | Resp 18 | Ht 65.5 in | Wt 150.4 lb

## 2021-06-20 DIAGNOSIS — I10 Essential (primary) hypertension: Secondary | ICD-10-CM | POA: Diagnosis not present

## 2021-06-20 DIAGNOSIS — E7800 Pure hypercholesterolemia, unspecified: Secondary | ICD-10-CM

## 2021-06-20 DIAGNOSIS — R300 Dysuria: Secondary | ICD-10-CM | POA: Diagnosis not present

## 2021-06-20 LAB — CBC
HCT: 38.4 % (ref 36.0–46.0)
Hemoglobin: 13 g/dL (ref 12.0–15.0)
MCHC: 33.9 g/dL (ref 30.0–36.0)
MCV: 91 fl (ref 78.0–100.0)
Platelets: 184 10*3/uL (ref 150.0–400.0)
RBC: 4.22 Mil/uL (ref 3.87–5.11)
RDW: 13.5 % (ref 11.5–15.5)
WBC: 8.4 10*3/uL (ref 4.0–10.5)

## 2021-06-20 LAB — COMPREHENSIVE METABOLIC PANEL
ALT: 22 U/L (ref 0–35)
AST: 21 U/L (ref 0–37)
Albumin: 3.9 g/dL (ref 3.5–5.2)
Alkaline Phosphatase: 89 U/L (ref 39–117)
BUN: 10 mg/dL (ref 6–23)
CO2: 30 mEq/L (ref 19–32)
Calcium: 9.3 mg/dL (ref 8.4–10.5)
Chloride: 99 mEq/L (ref 96–112)
Creatinine, Ser: 0.9 mg/dL (ref 0.40–1.20)
GFR: 61.67 mL/min (ref >60.00)
Glucose, Bld: 79 mg/dL (ref 70–99)
Potassium: 4.5 mEq/L (ref 3.5–5.1)
Sodium: 135 mEq/L (ref 135–145)
Total Bilirubin: 0.6 mg/dL (ref 0.2–1.2)
Total Protein: 6.6 g/dL (ref 6.0–8.3)

## 2021-06-20 LAB — LIPID PANEL
Cholesterol: 120 mg/dL (ref 0–200)
HDL: 37.1 mg/dL — ABNORMAL LOW (ref >39.00)
LDL Cholesterol: 60 mg/dL (ref 0–99)
NonHDL: 82.72
Total CHOL/HDL Ratio: 3
Triglycerides: 115 mg/dL (ref 0.0–149.0)
VLDL: 23 mg/dL (ref 0.0–40.0)

## 2021-06-20 LAB — URINALYSIS, ROUTINE W REFLEX MICROSCOPIC
Bilirubin Urine: NEGATIVE
Hgb urine dipstick: NEGATIVE
Ketones, ur: NEGATIVE
Leukocytes,Ua: NEGATIVE
Nitrite: NEGATIVE
Specific Gravity, Urine: 1.01 (ref 1.000–1.030)
Total Protein, Urine: NEGATIVE
Urine Glucose: NEGATIVE
Urobilinogen, UA: 0.2 (ref 0.0–1.0)
pH: 7 (ref 5.0–8.0)

## 2021-06-20 MED ORDER — FAMOTIDINE 20 MG PO TABS: 20.0000 mg | ORAL_TABLET | Freq: Every evening | ORAL | 3 refills | Status: DC | PRN

## 2021-06-20 NOTE — Assessment & Plan Note (Signed)
Taking lipitor 20 mg daily. Checking lipid panel and adjust as needed.  

## 2021-06-20 NOTE — Assessment & Plan Note (Signed)
Checking U/A and culture with several infections recently to check for resistance.

## 2021-06-20 NOTE — Patient Instructions (Signed)
We will check the labs and the urine today.  

## 2021-06-20 NOTE — Progress Notes (Signed)
   Subjective:   Patient ID: Lisa Cox, female    DOB: 05-19-1946, 75 y.o.   MRN: [id number]  HPI The patient is a 75 YO female coming in for recent UTI. Urology called in cipro 3 days then keflex 5 day which she finished last night. No current symptoms but concerned as this is second UTI in a few months. Also needs follow up other conditions.   Review of Systems  Constitutional: Negative.   HENT: Negative.    Eyes: Negative.   Respiratory:  Negative for cough, chest tightness and shortness of breath.   Cardiovascular:  Negative for chest pain, palpitations and leg swelling.  Gastrointestinal:  Negative for abdominal distention, abdominal pain, constipation, diarrhea, nausea and vomiting.  Genitourinary:  Positive for dysuria.  Musculoskeletal: Negative.   Skin: Negative.   Neurological: Negative.   Psychiatric/Behavioral: Negative.     Objective:  Physical Exam Constitutional:      Appearance: She is well-developed.  HENT:     Head: Normocephalic and atraumatic.  Cardiovascular:     Rate and Rhythm: Normal rate and regular rhythm.  Pulmonary:     Effort: Pulmonary effort is normal. No respiratory distress.     Breath sounds: Normal breath sounds. No wheezing or rales.  Abdominal:     General: Bowel sounds are normal. There is no distension.     Palpations: Abdomen is soft.     Tenderness: There is no abdominal tenderness. There is no rebound.  Musculoskeletal:     Cervical back: Normal range of motion.  Skin:    General: Skin is warm and dry.  Neurological:     Mental Status: She is alert and oriented to person, place, and time.     Coordination: Coordination normal.    Vitals:   06/20/21 1056  BP: 124/70  Pulse: 61  Resp: 18  Temp: 97.8 F (36.6 C)  TempSrc: Oral  SpO2: 99%  Weight: 150 lb 6.4 oz (68.2 kg)  Height: 5' 5.5" (1.664 m)    This visit occurred during the SARS-CoV-2 public health emergency.  Safety protocols were in place, including screening  questions prior to the visit, additional usage of staff PPE, and extensive cleaning of exam room while observing appropriate contact time as indicated for disinfecting solutions.   Assessment & Plan:

## 2021-06-20 NOTE — Assessment & Plan Note (Signed)
BP at goal on ramipril 20 mg daily and atenolol 50 mg BID. Checking CBC and CMP and adjust as needed.

## 2021-06-21 ENCOUNTER — Ambulatory Visit: Payer: PPO

## 2021-06-21 LAB — URINE CULTURE: Result:: NO GROWTH

## 2021-06-29 DIAGNOSIS — Z6825 Body mass index (BMI) 25.0-25.9, adult: Secondary | ICD-10-CM | POA: Diagnosis not present

## 2021-06-29 DIAGNOSIS — N958 Other specified menopausal and perimenopausal disorders: Secondary | ICD-10-CM | POA: Diagnosis not present

## 2021-06-29 DIAGNOSIS — Z01419 Encounter for gynecological examination (general) (routine) without abnormal findings: Secondary | ICD-10-CM | POA: Diagnosis not present

## 2021-08-02 ENCOUNTER — Ambulatory Visit (INDEPENDENT_AMBULATORY_CARE_PROVIDER_SITE_OTHER): Payer: PPO

## 2021-08-02 ENCOUNTER — Other Ambulatory Visit: Payer: Self-pay

## 2021-08-02 DIAGNOSIS — Z23 Encounter for immunization: Secondary | ICD-10-CM

## 2021-10-24 ENCOUNTER — Other Ambulatory Visit: Payer: Self-pay

## 2021-10-24 ENCOUNTER — Encounter: Payer: Self-pay | Admitting: Cardiology

## 2021-10-24 ENCOUNTER — Ambulatory Visit: Payer: PPO | Admitting: Cardiology

## 2021-10-24 VITALS — BP 140/68 | HR 71 | Ht 65.5 in | Wt 154.0 lb

## 2021-10-24 DIAGNOSIS — I10 Essential (primary) hypertension: Secondary | ICD-10-CM | POA: Diagnosis not present

## 2021-10-24 DIAGNOSIS — I447 Left bundle-branch block, unspecified: Secondary | ICD-10-CM

## 2021-10-24 DIAGNOSIS — I493 Ventricular premature depolarization: Secondary | ICD-10-CM | POA: Diagnosis not present

## 2021-10-24 NOTE — Patient Instructions (Addendum)

## 2021-10-24 NOTE — Progress Notes (Signed)
Electrophysiology Office Note   Date:  10/24/2021   ID:  Lisa Cox, DOB 06-20-1946, MRN [id number]  PCP:  Hoyt Koch, MD  Cardiologist:  Ellyn Hack Primary Electrophysiologist:  Christal Lagerstrom Meredith Leeds, MD    No chief complaint on file.    History of Present Illness: Lisa Cox is a 76 y.o. female who is being seen today for the evaluation of PVCs at the request of Glenetta Hew. Presenting today for electrophysiology evaluation.    She has a history significant for hypertension, hyperlipidemia, palpitations.  She wore a cardiac monitor that showed a high burden of PVCs.  She did not tolerate flecainide, diltiazem, or mexiletine.  She is now status post ablation for PVCs 08/19/2019.  Today, denies symptoms of palpitations, chest pain, shortness of breath, orthopnea, PND, lower extremity edema, claudication, dizziness, presyncope, syncope, bleeding, or neurologic sequela. The patient is tolerating medications without difficulties.  She has noted no further palpitations.  She has an occasional skipped beat, but nothing like it was prior to her ablation.  She is overall feeling well and is happy with her control.   Past Medical History:  Diagnosis Date   Abdominal pain, epigastric 11/12/2019   Acute cystitis with hematuria 10/29/2019   Anxiety    Bell's palsy 2009   Benign hypertensive heart disease without heart failure 07/11/2011   Cancer ([place]) Feb.2011   breast,lumpectomy right side followed by radiation   Depression    Dysuria 10/29/2019   Elevated lipase 11/18/2019   Fatigue 05/26/2019   Frequent unifocal PVCs 04/02/2017   She was post of had an echocardiogram done which I don't see having been done. At this point, I think with her not having too many symptoms of PVCs we can watch for now and then maybe oracular follow-up.   GERD (gastroesophageal reflux disease)    Hypercholesterolemia 07/11/2011   Hyperlipidemia    Hyponatremia 11/18/2019   Insomnia 04/10/2016    Moderate aortic regurgitation    Nausea 11/12/2019   Personal history of radiation therapy    PVC (premature ventricular contraction) 10/31/2018   Frequent PVCs with some bigeminy noted on 48-hour monitor. No arrhythmias.==> s/p RVOT PVC ablation October 2020   Routine general medical examination at a health care facility 07/12/2017   UTI symptoms 10/02/2019   Past Surgical History:  Procedure Laterality Date   BREAST LUMPECTOMY Right Feb. 2011   right breast,followed by radiation therapy   CARDIAC EVENT MONITOR     Predominantly sinus rhythm with first-degree block. Minimum heart rate 63 bpm. Maximum heart rate 139 bpm. Average is 85 bpm. 17.5% PVCs.  V bigem & Trigem noted.     EYE SURGERY  1953   skin removal in eye   GXT  04/2017   Exercise tolerance test off of beta-blockers: Normal blood pressure response to exercise.  No EKG changes to suggest ischemic changes.  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  Seen at rest, exercise and recovery.   NM MYOVIEW LTD  08/2018   LOW RISK. No ischemia or infarction   PVC ABLATION N/A 08/19/2019   Procedure: PVC ABLATION;  Surgeon: Constance Haw, MD;  Location: [facility];  Service: Cardiovascular;  Laterality: N/A;   TRANSTHORACIC ECHOCARDIOGRAM  08/2018; 06/2020   a) EF 55-60%.  No RWMA. Gr 2 DD. Mod AI.; b) Normal LV size and function.  EF 55-60%.  Moderate asymmetric with basal septal hypertrophy.  Indeterminate diastolic pressures.  Small pericardial effusion, moderate AI.  Normal CVP.   TUBAL LIGATION  1978     Current Outpatient Medications  Medication Sig Dispense Refill   aspirin 81 MG tablet Take 81 mg by mouth every evening.     atenolol (TENORMIN) 50 MG tablet TAKE 1 TABLET BY MOUTH TWICE A DAY 180 tablet 1   atorvastatin (LIPITOR) 20 MG tablet TAKE 1 TABLET BY MOUTH EVERY DAY 90 tablet 3   carboxymethylcellulose (REFRESH PLUS) 0.5 % SOLN Place 1 drop into both eyes 3 (three) times daily as needed (dry  eye).      Cholecalciferol (VITAMIN D3) 50 MCG (2000 UT) TABS Take 2,000 Units by mouth daily.      dimenhyDRINATE (DRAMAMINE) 50 MG tablet Take 50 mg by mouth at bedtime. Must take with Lorazepam     famotidine (PEPCID) 20 MG tablet Take 1 tablet (20 mg total) by mouth at bedtime as needed for heartburn or indigestion. 90 tablet 3   ibuprofen (ADVIL) 200 MG tablet Take 600 mg by mouth every 6 (six) hours as needed for headache.     LORazepam (ATIVAN) 1 MG tablet TAKE 1 TABLET BY MOUTH EVERYDAY AT BEDTIME 30 tablet 5   Multiple Vitamins-Minerals (PRESERVISION AREDS 2) CAPS Take 1 capsule by mouth 2 (two) times daily.     ramipril (ALTACE) 10 MG capsule TAKE 2 CAPSULES BY MOUTH DAILY 180 capsule 3   sodium chloride (OCEAN) 0.65 % SOLN nasal spray Place 1 spray into both nostrils as needed for congestion.     No current facility-administered medications for this visit.    Allergies:   Hctz [hydrochlorothiazide], Amlodipine, Crestor [rosuvastatin calcium], Erythromycin, Macrobid [nitrofurantoin monohyd macro], Niacin and related, Restoril, and Zolpidem tartrate   Social History:  The patient  reports that she has never smoked. She has never used smokeless tobacco. She reports that she does not drink alcohol and does not use drugs.   Family History:  The patient's family history includes Breast cancer (age of onset: 36) in her paternal aunt; Heart attack in her father; Heart disease in her father; Hypertension in her brother and sister; Pancreatic cancer in her father; Stroke in her maternal aunt.   ROS:  Please see the history of present illness.   Otherwise, review of systems is positive for none.   All other systems are reviewed and negative.   PHYSICAL EXAM: VS:  BP 140/68    Pulse 71    Ht 5' 5.5" (1.664 m)    Wt 154 lb (69.9 kg)    BMI 25.24 kg/m  , BMI Body mass index is 25.24 kg/m. GEN: Well nourished, well developed, in no acute distress  HEENT: normal  Neck: no JVD, carotid bruits,  or masses Cardiac: RRR; no murmurs, rubs, or gallops,no edema  Respiratory:  clear to auscultation bilaterally, normal work of breathing GI: soft, nontender, nondistended, + BS MS: no deformity or atrophy  Skin: warm and dry Neuro:  Strength and sensation are intact Psych: euthymic mood, full affect  EKG:  EKG is ordered today. Personal review of the ekg ordered shows sinus rhythm, left bundle branch block, rate 71  Recent Labs: 06/20/2021: ALT 22; BUN 10; Creatinine, Ser 0.90; Hemoglobin 13.0; Platelets 184.0; Potassium 4.5; Sodium 135    Lipid Panel     Component Value Date/Time   CHOL 120 06/20/2021 1112   CHOL 158 06/23/2020 0928   TRIG 115.0 06/20/2021 1112   HDL 37.10 (L) 06/20/2021 1112   HDL 45 06/23/2020 0928   CHOLHDL 3  06/20/2021 1112   VLDL 23.0 06/20/2021 1112   LDLCALC 60 06/20/2021 1112   LDLCALC 90 06/23/2020 0928     Wt Readings from Last 3 Encounters:  10/24/21 154 lb (69.9 kg)  06/20/21 150 lb 6.4 oz (68.2 kg)  02/23/21 155 lb (70.3 kg)      Other studies Reviewed: Additional studies/ records that were reviewed today include: TTE 09/02/18  Review of the above records today demonstrates:    - Left ventricle: The cavity size was normal. Systolic function was   normal. The estimated ejection fraction was in the range of 55%   to 60%. Wall motion was normal; there were no regional wall   motion abnormalities. Features are consistent with a pseudonormal   left ventricular filling pattern, with concomitant abnormal   relaxation and increased filling pressure (grade 2 diastolic   dysfunction). - Aortic valve: There was moderate regurgitation. Regurgitation   pressure half-time: 422 ms. - Mitral valve: There was trivial regurgitation. - Right ventricle: Systolic function was normal. - Atrial septum: No defect or patent foramen ovale was identified. - Tricuspid valve: There was trivial regurgitation. - Pulmonic valve: There was no significant  regurgitation.  SPECT 09/11/18 There was no ST segment deviation noted during stress. The study is normal. This is a low risk study with no evidence of perfusion abnormalities. This study was not gated due to frequent ventricular ectopy.  Monitor 08/26/18 - personally reviewed Predominantly sinus rhythm with first-degree block. Minimum heart rate 63 bpm. Maximum heart rate 139 bpm. Average is 85 bpm. Very rare PACs noted. (<1%) Frequent isolated PVCs (17.5%); Ventricular trigeminy and bigeminy noted on diary. No ventricular tachycardia or other arrhythmias. No pauses or bradycardia.   Significant amount of ventricular ectopy noted, but no arrhythmia.  ASSESSMENT AND PLAN:  1.  PVCs: Patient did not tolerate flecainide or mexiletine.  Post ablation 08/19/2019.  No further PVCs.  Continue with current management.  2.  Hypertension: Mildly elevated but usually well controlled.  No changes.  3.  Left bundle branch block: Developed December 2020.  Left bundle branch block is since normalized.  We Fern Canova continue to monitor.  Current medicines are reviewed at length with the patient today.   The patient does not have concerns regarding her medicines.  The following changes were made today: None  Labs/ tests ordered today include:  Orders Placed This Encounter  Procedures   EKG 12-Lead    Disposition:   FU with Kodie Kishi 12 months  Signed, Shasta Chinn Meredith Leeds, MD  10/24/2021 11:50 AM     [address] [place] [address] [phone] (office) [phone] (fax)

## 2021-11-16 ENCOUNTER — Other Ambulatory Visit: Payer: Self-pay | Admitting: Internal Medicine

## 2021-11-16 ENCOUNTER — Other Ambulatory Visit: Payer: Self-pay | Admitting: Cardiology

## 2021-12-20 ENCOUNTER — Encounter: Payer: Self-pay | Admitting: Cardiology

## 2021-12-20 ENCOUNTER — Other Ambulatory Visit: Payer: Self-pay

## 2021-12-20 ENCOUNTER — Ambulatory Visit: Payer: PPO | Admitting: Cardiology

## 2021-12-20 VITALS — BP 129/64 | HR 73 | Ht 65.5 in | Wt 154.8 lb

## 2021-12-20 DIAGNOSIS — R0609 Other forms of dyspnea: Secondary | ICD-10-CM

## 2021-12-20 DIAGNOSIS — I10 Essential (primary) hypertension: Secondary | ICD-10-CM | POA: Diagnosis not present

## 2021-12-20 DIAGNOSIS — E7800 Pure hypercholesterolemia, unspecified: Secondary | ICD-10-CM

## 2021-12-20 DIAGNOSIS — I119 Hypertensive heart disease without heart failure: Secondary | ICD-10-CM | POA: Diagnosis not present

## 2021-12-20 DIAGNOSIS — I493 Ventricular premature depolarization: Secondary | ICD-10-CM | POA: Diagnosis not present

## 2021-12-20 NOTE — Progress Notes (Unsigned)
Primary Care Provider: Hoyt Koch, MD Cardiologist: Glenetta Hew, MD Electrophysiologist: [name], MD  Clinic Note: No chief complaint on file.   ===================================  ASSESSMENT/PLAN   Problem List Items Addressed This Visit       Cardiology Problems   Frequent unifocal PVCs - Primary (Chronic)   Hypercholesterolemia (Chronic)   Benign hypertensive heart disease without heart failure (Chronic)   Essential hypertension (Chronic)    ===================================  HPI:    Lisa Cox is a 76 y.o. female with a PMH below who presents today for ***.  PVC ablation 08/19/2019 -> [name] had LBBB post ablation, but resolved.  [name] was last seen on ***  Recent Hospitalizations: ***  Was seen by Dr. Curt Bears in January with no major complaints.  Tolerating medications well.  Occasional skipped beat but nothing like it was prior to her ablation.   Reviewed  CV studies:    The following studies were reviewed today: (if available, images/films reviewed: From Epic Chart or Care Everywhere) ***:   Interval History:   Lisa Cox   CV Review of Symptoms (Summary) Cardiovascular ROS: {roscv:[id number]}  REVIEWED OF SYSTEMS   ROS  I have reviewed and (if needed) personally updated the patient's problem list, medications, allergies, past medical and surgical history, social and family history.   PAST MEDICAL HISTORY   Past Medical History:  Diagnosis Date   Abdominal pain, epigastric 11/12/2019   Acute cystitis with hematuria 10/29/2019   Anxiety    Bell's palsy 2009   Benign hypertensive heart disease without heart failure 07/11/2011   Cancer ([place]) Feb.2011   breast,lumpectomy right side followed by radiation   Depression    Dysuria 10/29/2019   Elevated lipase 11/18/2019   Fatigue 05/26/2019   Frequent unifocal PVCs 04/02/2017   She was post of had an echocardiogram done which I don't see having been done. At  this point, I think with her not having too many symptoms of PVCs we can watch for now and then maybe oracular follow-up.   GERD (gastroesophageal reflux disease)    Hypercholesterolemia 07/11/2011   Hyperlipidemia    Hyponatremia 11/18/2019   Insomnia 04/10/2016   Moderate aortic regurgitation    Nausea 11/12/2019   Personal history of radiation therapy    PVC (premature ventricular contraction) 10/31/2018   Frequent PVCs with some bigeminy noted on 48-hour monitor. No arrhythmias.==> s/p RVOT PVC ablation October 2020   Routine general medical examination at a health care facility 07/12/2017   UTI symptoms 10/02/2019    PAST SURGICAL HISTORY   Past Surgical History:  Procedure Laterality Date   BREAST LUMPECTOMY Right Feb. 2011   right breast,followed by radiation therapy   CARDIAC EVENT MONITOR     Predominantly sinus rhythm with first-degree block. Minimum heart rate 63 bpm. Maximum heart rate 139 bpm. Average is 85 bpm. 17.5% PVCs.  V bigem & Trigem noted.     EYE SURGERY  1953   skin removal in eye   GXT  04/2017   Exercise tolerance test off of beta-blockers: Normal blood pressure response to exercise.  No EKG changes to suggest ischemic changes.  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  Seen at rest, exercise and recovery.   NM MYOVIEW LTD  08/2018   LOW RISK. No ischemia or infarction   PVC ABLATION N/A 08/19/2019   Procedure: PVC ABLATION;  Surgeon: Constance Haw, MD;  Location: [facility];  Service: Cardiovascular;  Laterality: N/A;   TRANSTHORACIC ECHOCARDIOGRAM  08/2018; 06/2020   a) EF 55-60%.  No RWMA. Gr 2 DD. Mod AI.; b) Normal LV size and function.  EF 55-60%.  Moderate asymmetric with basal septal hypertrophy.  Indeterminate diastolic pressures.  Small pericardial effusion, moderate AI.  Normal CVP.   TUBAL LIGATION  1978    Immunization History  Administered Date(s) Administered   Fluad Quad(high Dose 65+) 07/01/2019, 08/02/2021    Influenza, High Dose Seasonal PF 07/12/2017, 08/05/2018, 08/23/2020   Influenza,inj,Quad PF,6+ Mos 07/30/2016   Influenza-Unspecified 08/22/2013   PFIZER(Purple Top)SARS-COV-2 Vaccination 11/11/2019, 12/02/2019, 08/10/2020   Pfizer Covid-19 Vaccine Bivalent Booster 23yrs & up 08/10/2021   Pneumococcal Polysaccharide-23 07/12/2017    MEDICATIONS/ALLERGIES   Current Meds  Medication Sig   aspirin 81 MG tablet Take 81 mg by mouth every evening.   atenolol (TENORMIN) 50 MG tablet TAKE 1 TABLET BY MOUTH TWICE A DAY   atorvastatin (LIPITOR) 20 MG tablet TAKE 1 TABLET BY MOUTH EVERY DAY (Patient taking differently: Take 10 mg by mouth daily.)   carboxymethylcellulose (REFRESH PLUS) 0.5 % SOLN Place 1 drop into both eyes 3 (three) times daily as needed (dry eye).    Cholecalciferol (VITAMIN D3) 50 MCG (2000 UT) TABS Take 2,000 Units by mouth daily.    dimenhyDRINATE (DRAMAMINE) 50 MG tablet Take 50 mg by mouth at bedtime. Must take with Lorazepam   famotidine (PEPCID) 20 MG tablet Take 1 tablet (20 mg total) by mouth at bedtime as needed for heartburn or indigestion.   ibuprofen (ADVIL) 200 MG tablet Take 600 mg by mouth every 6 (six) hours as needed for headache.   LORazepam (ATIVAN) 1 MG tablet TAKE 1 TABLET BY MOUTH EVERYDAY AT BEDTIME   Multiple Vitamins-Minerals (PRESERVISION AREDS 2) CAPS Take 1 capsule by mouth 2 (two) times daily.   ramipril (ALTACE) 10 MG capsule TAKE 2 CAPSULES BY MOUTH DAILY (Patient taking differently: Take 10 mg by mouth daily.)   sodium chloride (OCEAN) 0.65 % SOLN nasal spray Place 1 spray into both nostrils as needed for congestion.    Allergies  Allergen Reactions   Hctz [Hydrochlorothiazide] Other (See Comments)    Patient developed significant hyponatremia shortly after starting.  Would also include chlorthalidone   Amlodipine Other (See Comments)    Peripheral edema   Crestor [Rosuvastatin Calcium] Other (See Comments)    Leg cramps   Erythromycin  Nausea Only   Macrobid [Nitrofurantoin Monohyd Macro] Nausea And Vomiting   Niacin And Related Itching   Restoril Other (See Comments)    Reaction-"nervous"   Zolpidem Tartrate     Reaction-"nervous"    SOCIAL HISTORY/FAMILY HISTORY   Reviewed in Epic:  Pertinent findings:  Social History   Tobacco Use   Smoking status: Never   Smokeless tobacco: Never  Substance Use Topics   Alcohol use: No   Drug use: No   Social History   Social History Narrative   Not on file    OBJCTIVE -PE, EKG, labs   Wt Readings from Last 3 Encounters:  12/20/21 154 lb 12.8 oz (70.2 kg)  10/24/21 154 lb (69.9 kg)  06/20/21 150 lb 6.4 oz (68.2 kg)    Physical Exam: BP 129/64    Pulse 73    Ht 5' 5.5" (1.664 m)    Wt 154 lb 12.8 oz (70.2 kg)    SpO2 99%    BMI 25.37 kg/m  Physical Exam   Adult ECG Report  Rate: *** ;  Rhythm: {  rhythm:17366};   Narrative Interpretation: ***  Recent Labs:  ***  Lab Results  Component Value Date   CHOL 120 06/20/2021   HDL 37.10 (L) 06/20/2021   LDLCALC 60 06/20/2021   TRIG 115.0 06/20/2021   CHOLHDL 3 06/20/2021   Lab Results  Component Value Date   CREATININE 0.90 06/20/2021   BUN 10 06/20/2021   NA 135 06/20/2021   K 4.5 06/20/2021   CL 99 06/20/2021   CO2 30 06/20/2021   CBC Latest Ref Rng & Units 06/20/2021 11/16/2019 11/13/2019  WBC 4.0 - 10.5 K/uL 8.4 8.2 8.9  Hemoglobin 12.0 - 15.0 g/dL 13.0 12.8 13.5  Hematocrit 36.0 - 46.0 % 38.4 38.0 39.3  Platelets 150.0 - 400.0 K/uL 184.0 189.0 211.0    Lab Results  Component Value Date   HGBA1C 5.4 05/26/2019   Lab Results  Component Value Date   TSH 3.43 11/16/2019    ==================================================  COVID-19 Education: The signs and symptoms of COVID-19 were discussed with the patient and how to seek care for testing (follow up with PCP or arrange E-visit).    I spent a total of ***minutes with the patient spent in direct patient consultation.  Additional time  spent with chart review  / charting (studies, outside notes, etc): *** min Total Time: *** min  Current medicines are reviewed at length with the patient today.  (+/- concerns) ***  This visit occurred during the SARS-CoV-2 public health emergency.  Safety protocols were in place, including screening questions prior to the visit, additional usage of staff PPE, and extensive cleaning of exam room while observing appropriate contact time as indicated for disinfecting solutions.  Notice: This dictation was prepared with Dragon dictation along with smart phrase technology. Any transcriptional errors that result from this process are unintentional and may not be corrected upon review.  Studies Ordered:   No orders of the defined types were placed in this encounter.   Patient Instructions / Medication Changes & Studies & Tests Ordered   There are no Patient Instructions on file for this visit.     Glenetta Hew, M.D., M.S. Interventional Cardiologist   Pager # [phone] Phone # [phone] [address] [place], [place] [address]   Thank you for choosing [facility] at [facility]!!

## 2021-12-20 NOTE — Patient Instructions (Signed)
Medication Instructions:  ?No changes  ? ?*If you need a refill on your cardiac medications before your next appointment, please call your pharmacy* ? ? ?Lab Work: ?Not needed ?. ? ? ?Testing/Procedures: ? ?Not needed ? ?Follow-Up: ?At [facility], you and your health needs are our priority.  As part of our continuing mission to provide you with exceptional heart care, we have created designated Provider Care Teams.  These Care Teams include your primary Cardiologist (physician) and Advanced Practice Providers (APPs -  Physician Assistants and Nurse Practitioners) who all work together to provide you with the care you need, when you need it. ? ?  ? ?Your next appointment:   ?5 month(s) AUG 2023 ? ?The format for your next appointment:   ?In Person ? ?Provider:   ?Caron Presume, PA-C or Jory Sims, DNP, ANP    Then, Glenetta Hew, MD will plan to see you again in 17 month(s)   ( July/Aug 204) . ? ? ?

## 2021-12-25 ENCOUNTER — Ambulatory Visit: Payer: PPO

## 2021-12-26 ENCOUNTER — Ambulatory Visit (INDEPENDENT_AMBULATORY_CARE_PROVIDER_SITE_OTHER): Payer: PPO | Admitting: Internal Medicine

## 2021-12-26 ENCOUNTER — Ambulatory Visit: Payer: PPO

## 2021-12-26 ENCOUNTER — Other Ambulatory Visit: Payer: Self-pay

## 2021-12-26 ENCOUNTER — Encounter: Payer: Self-pay | Admitting: Internal Medicine

## 2021-12-26 VITALS — BP 126/78 | HR 63 | Resp 18 | Ht 65.5 in | Wt 155.2 lb

## 2021-12-26 DIAGNOSIS — G4700 Insomnia, unspecified: Secondary | ICD-10-CM | POA: Diagnosis not present

## 2021-12-26 DIAGNOSIS — Z0000 Encounter for general adult medical examination without abnormal findings: Secondary | ICD-10-CM

## 2021-12-26 DIAGNOSIS — I10 Essential (primary) hypertension: Secondary | ICD-10-CM | POA: Diagnosis not present

## 2021-12-26 DIAGNOSIS — K219 Gastro-esophageal reflux disease without esophagitis: Secondary | ICD-10-CM | POA: Diagnosis not present

## 2021-12-26 DIAGNOSIS — Z853 Personal history of malignant neoplasm of breast: Secondary | ICD-10-CM | POA: Diagnosis not present

## 2021-12-26 DIAGNOSIS — E7800 Pure hypercholesterolemia, unspecified: Secondary | ICD-10-CM

## 2021-12-26 DIAGNOSIS — I493 Ventricular premature depolarization: Secondary | ICD-10-CM

## 2021-12-26 MED ORDER — ATORVASTATIN CALCIUM 10 MG PO TABS: 10.0000 mg | ORAL_TABLET | Freq: Every day | ORAL | 3 refills | Status: DC

## 2021-12-26 NOTE — Patient Instructions (Addendum)
Think about getting the shingles vaccine at the pharmacy.   

## 2021-12-26 NOTE — Assessment & Plan Note (Signed)
Recent lipid panel improved on lipitor 10 mg daily. Have updated rx as this was not accurate before (listed as 20 mg daily) for improved adherence.  ?

## 2021-12-26 NOTE — Assessment & Plan Note (Signed)
Gets mammogram yearly for surveillance and no signs of recurrence.  ?

## 2021-12-26 NOTE — Assessment & Plan Note (Signed)
Suppressed on atenolol 50 mg BID and HR at goal today.  ?

## 2021-12-26 NOTE — Assessment & Plan Note (Signed)
Uses pepcid 20 mg daily qhs and if missed does get symptoms. Will continue daily at current dosing.  ?

## 2021-12-26 NOTE — Assessment & Plan Note (Signed)
Flu shot up to date. Covid-19 up to date. Pneumonia counseled she could get prevnar 20 and she declines today. Shingrix counseled to get at pharmacy. Tetanus counseled to get at pharmacy. Colonoscopy aged out future. Mammogram gets yearly due 2023, pap smear aged out and dexa complete. Counseled about sun safety and mole surveillance. Counseled about the dangers of distracted driving. Given 10 year screening recommendations.  ? ?

## 2021-12-26 NOTE — Assessment & Plan Note (Signed)
BP at goal on atenolol 50 mg BID and ramipril 20 mg daily. Recent CMP reviewed with her and no indication for change. Will continue at current dosing.  ?

## 2021-12-26 NOTE — Progress Notes (Signed)
? ?Subjective:  ? ?Patient ID: Lisa Cox, female    DOB: [birth year], 76 y.o.   MRN: [id number] ? ?HPI ?Here for medicare wellness and physical, no new complaints. Please see A/P for status and treatment of chronic medical problems.  ? ?Diet: heart healthy ?Physical activity: sedentary ?Depression/mood screen: negative ?Hearing: intact to whispered voice, mild loss ?Visual acuity: grossly normal with lens, performs annual eye exam  ?ADLs: capable ?Fall risk: none ?Home safety: good ?Cognitive evaluation: intact to orientation, naming, recall and repetition ?EOL planning: adv directives discussed ? ?[place] Office Visit from 12/26/2021 in [place] [facility] [place]  ?PHQ-2 Total Score 0  ? ?  ?  ? ?Fall Risk 06/09/2019 09/11/2019 10/29/2019 11/25/2020 12/26/2021  ?Falls in the past year? - 0 0 0 0  ?Was there an injury with Fall? - - 0 0 0  ?Fall Risk Category Calculator - - 0 0 0  ?Fall Risk Category - - Low Low Low  ?Patient Fall Risk Level Low fall risk - Low fall risk Low fall risk -  ?Fall risk Follow up - - Falls evaluation completed - -  ? ? ?I have personally reviewed and have noted ?1. The patient's medical and social history - reviewed today no changes ?2. Their use of alcohol, tobacco or illicit drugs ?3. Their current medications and supplements ?4. The patient's functional ability including ADL's, fall risks, home safety risks and hearing or visual impairment. ?5. Diet and physical activities ?6. Evidence for depression or mood disorders ?7. Care team reviewed and updated ?8.  The patient is not on an opioid pain medication. ? ?Patient Care Team: ?Hoyt Koch, MD as PCP - General (Internal Medicine) ?Leonie Man, MD as PCP - Cardiology (Cardiology) ?Constance Haw, MD as PCP - Electrophysiology (Cardiology) ?Tomasa Blase, [facility] as Pharmacist (Pharmacist) ?Past Medical History:  ?Diagnosis Date  ? Abdominal pain, epigastric 11/12/2019  ? Acute cystitis with hematuria  10/29/2019  ? Anxiety   ? Bell's palsy 2009  ? Benign hypertensive heart disease without heart failure 07/11/2011  ? Cancer [facility]) Feb.2011  ? breast,lumpectomy right side followed by radiation  ? Depression   ? Dysuria 10/29/2019  ? Elevated lipase 11/18/2019  ? Fatigue 05/26/2019  ? Frequent unifocal PVCs 04/02/2017  ? She was post of had an echocardiogram done which I don't see having been done. At this point, I think with her not having too many symptoms of PVCs we can watch for now and then maybe oracular follow-up.  ? GERD (gastroesophageal reflux disease)   ? Hypercholesterolemia 07/11/2011  ? Hyperlipidemia   ? Hyponatremia 11/18/2019  ? Insomnia 04/10/2016  ? Moderate aortic regurgitation   ? Nausea 11/12/2019  ? Personal history of radiation therapy   ? PVC (premature ventricular contraction) 10/31/2018  ? Frequent PVCs with some bigeminy noted on 48-hour monitor. No arrhythmias.==> s/p RVOT PVC ablation October 2020  ? Routine general medical examination at a health care facility 07/12/2017  ? UTI symptoms 10/02/2019  ? ?Past Surgical History:  ?Procedure Laterality Date  ? BREAST LUMPECTOMY Right Feb. 2011  ? right breast,followed by radiation therapy  ? CARDIAC EVENT MONITOR    ? Predominantly sinus rhythm with first-degree block. Minimum heart rate 63 bpm. Maximum heart rate 139 bpm. Average is 85 bpm. 17.5% PVCs.  V bigem & Trigem noted.    ? Needles  ? skin removal in eye  ? GXT  04/2017  ? Exercise  tolerance test off of beta-blockers: Normal blood pressure response to exercise.  No EKG changes to suggest ischemic changes.  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  Seen at rest, exercise and recovery.  ? NM MYOVIEW LTD  08/2018  ? LOW RISK. No ischemia or infarction  ? PVC ABLATION N/A 08/19/2019  ? Procedure: PVC ABLATION;  Surgeon: Constance Haw, MD;  Location: [facility];  Service: Cardiovascular;  Laterality: N/A;  ? TRANSTHORACIC ECHOCARDIOGRAM  08/2018; 06/2020  ? a) EF  55-60%.  No RWMA. Gr 2 DD. Mod AI.; b) Normal LV size and function.  EF 55-60%.  Moderate asymmetric with basal septal hypertrophy.  Indeterminate diastolic pressures.  Small pericardial effusion, moderate AI.  Normal CVP.  ? TUBAL LIGATION  1978  ? ?Family History  ?Problem Relation Age of Onset  ? Heart disease Father   ? Heart attack Father   ? Pancreatic cancer Father   ? Hypertension Sister   ? Hypertension Brother   ? Stroke Maternal Aunt   ? Breast cancer Paternal Aunt 26  ?     x 2  ? ?Review of Systems  ?Constitutional: Negative.   ?HENT: Negative.    ?Eyes: Negative.   ?Respiratory:  Negative for cough, chest tightness and shortness of breath.   ?Cardiovascular:  Negative for chest pain, palpitations and leg swelling.  ?Gastrointestinal:  Negative for abdominal distention, abdominal pain, constipation, diarrhea, nausea and vomiting.  ?Musculoskeletal: Negative.   ?Skin: Negative.   ?Neurological: Negative.   ?Psychiatric/Behavioral: Negative.    ? ?Objective:  ?Physical Exam ?Constitutional:   ?   Appearance: She is well-developed.  ?HENT:  ?   Head: Normocephalic and atraumatic.  ?Cardiovascular:  ?   Rate and Rhythm: Normal rate and regular rhythm.  ?Pulmonary:  ?   Effort: Pulmonary effort is normal. No respiratory distress.  ?   Breath sounds: Normal breath sounds. No wheezing or rales.  ?Abdominal:  ?   General: Bowel sounds are normal. There is no distension.  ?   Palpations: Abdomen is soft.  ?   Tenderness: There is no abdominal tenderness. There is no rebound.  ?Musculoskeletal:  ?   Cervical back: Normal range of motion.  ?Skin: ?   General: Skin is warm and dry.  ?Neurological:  ?   Mental Status: She is alert and oriented to person, place, and time.  ?   Coordination: Coordination normal.  ? ? ?Vitals:  ? 12/26/21 1342  ?BP: 126/78  ?Pulse: 63  ?Resp: 18  ?SpO2: 98%  ?Weight: 155 lb 3.2 oz (70.4 kg)  ?Height: 5' 5.5" (1.664 m)  ? ?This visit occurred during the SARS-CoV-2 public health  emergency.  Safety protocols were in place, including screening questions prior to the visit, additional usage of staff PPE, and extensive cleaning of exam room while observing appropriate contact time as indicated for disinfecting solutions.  ? ?Assessment & Plan:  ? ?

## 2021-12-26 NOTE — Assessment & Plan Note (Signed)
Uses lorazepam 1 mg daily for sleep. Counseled about risk of dependence, falls, memory change. She wishes to continue and is not experiencing side effects currently.  ?

## 2022-01-10 ENCOUNTER — Other Ambulatory Visit: Payer: Self-pay

## 2022-01-10 ENCOUNTER — Telehealth: Payer: Self-pay

## 2022-01-10 ENCOUNTER — Other Ambulatory Visit (INDEPENDENT_AMBULATORY_CARE_PROVIDER_SITE_OTHER): Payer: PPO

## 2022-01-10 DIAGNOSIS — R300 Dysuria: Secondary | ICD-10-CM

## 2022-01-10 NOTE — Telephone Encounter (Signed)
Pt thinks she has a UTI and wants to bring a urine sample to be tested. Pt is experiencing burning when urinating.Pt was seen on 12/26/21 and was wondering if this could be done without an appt. ? ?Please advise. ?

## 2022-01-10 NOTE — Telephone Encounter (Signed)
Pt has been made aware that lab orders have been placed. No other questions or concerns.  ?

## 2022-01-10 NOTE — Telephone Encounter (Signed)
Orders placed.

## 2022-01-11 ENCOUNTER — Telehealth: Payer: Self-pay | Admitting: Internal Medicine

## 2022-01-11 LAB — URINALYSIS, ROUTINE W REFLEX MICROSCOPIC
Bilirubin Urine: NEGATIVE
Hgb urine dipstick: NEGATIVE
Ketones, ur: NEGATIVE
Nitrite: NEGATIVE
RBC / HPF: NONE SEEN (ref 0–?)
Specific Gravity, Urine: 1.015 (ref 1.000–1.030)
Total Protein, Urine: NEGATIVE
Urine Glucose: NEGATIVE
Urobilinogen, UA: 0.2 (ref 0.0–1.0)
pH: 6 (ref 5.0–8.0)

## 2022-01-11 NOTE — Telephone Encounter (Signed)
Pt checking status of 01-10-2022 lab results ? ?Pt requesting a cb w/ results ?

## 2022-01-11 NOTE — Telephone Encounter (Signed)
Called pt and advised that Dr. Sharlet Salina has not received her results yet. I advised that when she does receive them our office would contact her. She was very appreciative for the call. No other questions or concerns.  ?

## 2022-01-12 ENCOUNTER — Other Ambulatory Visit: Payer: Self-pay | Admitting: Internal Medicine

## 2022-01-12 LAB — URINE CULTURE

## 2022-01-12 MED ORDER — SULFAMETHOXAZOLE-TRIMETHOPRIM 800-160 MG PO TABS: 1.0000 | ORAL_TABLET | Freq: Two times a day (BID) | ORAL | 0 refills | Status: DC

## 2022-01-12 NOTE — Telephone Encounter (Signed)
Pt calling for results. I advised pt that culture wasn't back yet.  ? ?FYI ?

## 2022-01-21 ENCOUNTER — Encounter: Payer: Self-pay | Admitting: Cardiology

## 2022-01-21 NOTE — Assessment & Plan Note (Signed)
Status post ablation.  Also maintaining on twice daily atenolol.  This also gives her blood pressure control.  Basically, she has not had any bradycardic issues. ?

## 2022-01-21 NOTE — Assessment & Plan Note (Signed)
More just deconditioning noted now.  Not having true exertional dyspnea, just deconditioned ?

## 2022-01-21 NOTE — Assessment & Plan Note (Addendum)
Blood pressure well controlled on current dose of atenolol and ramipril.  Not requiring diuretic.  Stable.  No change . ?

## 2022-01-21 NOTE — Assessment & Plan Note (Signed)
After multiple different attempts of medications, she has settled down on 10 mg ramipril and 50 mg twice daily atenolol.  Blood pressures are stable.  She seems relatively euvolemic.  Trivial edema.  Would rather her use support stockings and foot elevation as opposed to giving her diuretic. ?

## 2022-01-21 NOTE — Assessment & Plan Note (Signed)
Currently only taking 10 mg atorvastatin.  Labs from October 2022 showed well-controlled lipids. ? ?No need to change dose. ?

## 2022-03-05 DIAGNOSIS — N3020 Other chronic cystitis without hematuria: Secondary | ICD-10-CM | POA: Diagnosis not present

## 2022-03-05 DIAGNOSIS — N952 Postmenopausal atrophic vaginitis: Secondary | ICD-10-CM | POA: Diagnosis not present

## 2022-03-14 DIAGNOSIS — H26492 Other secondary cataract, left eye: Secondary | ICD-10-CM | POA: Diagnosis not present

## 2022-03-14 DIAGNOSIS — Z961 Presence of intraocular lens: Secondary | ICD-10-CM | POA: Diagnosis not present

## 2022-03-14 DIAGNOSIS — H52203 Unspecified astigmatism, bilateral: Secondary | ICD-10-CM | POA: Diagnosis not present

## 2022-03-14 DIAGNOSIS — H353131 Nonexudative age-related macular degeneration, bilateral, early dry stage: Secondary | ICD-10-CM | POA: Diagnosis not present

## 2022-04-03 ENCOUNTER — Other Ambulatory Visit: Payer: Self-pay | Admitting: Cardiology

## 2022-04-04 DIAGNOSIS — H26492 Other secondary cataract, left eye: Secondary | ICD-10-CM | POA: Diagnosis not present

## 2022-04-12 ENCOUNTER — Telehealth: Payer: Self-pay | Admitting: Internal Medicine

## 2022-04-12 DIAGNOSIS — R300 Dysuria: Secondary | ICD-10-CM

## 2022-04-12 NOTE — Telephone Encounter (Signed)
Pt called and stated she was seen on 01/10/22 for dysuria. She said she is still experiencing burning in her genital area. She is requesting orders for a urine test. I tried to get her an appointment with Dr. Sharlet Salina but she refused an appointment and requested the urine test.   Please advise.

## 2022-04-13 ENCOUNTER — Other Ambulatory Visit (INDEPENDENT_AMBULATORY_CARE_PROVIDER_SITE_OTHER): Payer: PPO

## 2022-04-13 DIAGNOSIS — R300 Dysuria: Secondary | ICD-10-CM

## 2022-04-13 LAB — URINALYSIS, ROUTINE W REFLEX MICROSCOPIC
Bilirubin Urine: NEGATIVE
Hgb urine dipstick: NEGATIVE
Ketones, ur: NEGATIVE
Leukocytes,Ua: NEGATIVE
Nitrite: NEGATIVE
RBC / HPF: NONE SEEN (ref 0–?)
Specific Gravity, Urine: 1.025 (ref 1.000–1.030)
Total Protein, Urine: NEGATIVE
Urine Glucose: NEGATIVE
Urobilinogen, UA: 0.2 (ref 0.0–1.0)
pH: 5.5 (ref 5.0–8.0)

## 2022-04-13 NOTE — Telephone Encounter (Signed)
Spoke with the patient and has agreed to come in this afternoon at 2:45 pm to leave a urine sample at the lab. No other questions or concerns.

## 2022-04-14 LAB — URINE CULTURE

## 2022-04-27 ENCOUNTER — Other Ambulatory Visit: Payer: Self-pay | Admitting: Internal Medicine

## 2022-04-27 DIAGNOSIS — Z1231 Encounter for screening mammogram for malignant neoplasm of breast: Secondary | ICD-10-CM

## 2022-05-21 ENCOUNTER — Other Ambulatory Visit: Payer: Self-pay | Admitting: Internal Medicine

## 2022-05-22 ENCOUNTER — Encounter: Payer: Self-pay | Admitting: Internal Medicine

## 2022-05-22 ENCOUNTER — Ambulatory Visit (INDEPENDENT_AMBULATORY_CARE_PROVIDER_SITE_OTHER): Payer: PPO | Admitting: Internal Medicine

## 2022-05-22 VITALS — BP 122/68 | HR 63 | Resp 18 | Ht 65.5 in | Wt 154.6 lb

## 2022-05-22 DIAGNOSIS — M792 Neuralgia and neuritis, unspecified: Secondary | ICD-10-CM

## 2022-05-22 DIAGNOSIS — I10 Essential (primary) hypertension: Secondary | ICD-10-CM

## 2022-05-22 DIAGNOSIS — E7800 Pure hypercholesterolemia, unspecified: Secondary | ICD-10-CM

## 2022-05-22 LAB — CBC
HCT: 38.8 % (ref 36.0–46.0)
Hemoglobin: 13 g/dL (ref 12.0–15.0)
MCHC: 33.6 g/dL (ref 30.0–36.0)
MCV: 91 fl (ref 78.0–100.0)
Platelets: 175 10*3/uL (ref 150.0–400.0)
RBC: 4.27 Mil/uL (ref 3.87–5.11)
RDW: 13.7 % (ref 11.5–15.5)
WBC: 8.1 10*3/uL (ref 4.0–10.5)

## 2022-05-22 LAB — HEMOGLOBIN A1C: Hgb A1c MFr Bld: 5.7 % (ref 4.6–6.5)

## 2022-05-22 LAB — TSH: TSH: 5.11 u[IU]/mL (ref 0.35–5.50)

## 2022-05-22 LAB — VITAMIN D 25 HYDROXY (VIT D DEFICIENCY, FRACTURES): VITD: 33.12 ng/mL (ref 30.00–100.00)

## 2022-05-22 LAB — VITAMIN B12: Vitamin B-12: 1222 pg/mL — ABNORMAL HIGH (ref 211–911)

## 2022-05-22 NOTE — Progress Notes (Signed)
   Subjective:   Patient ID: Lisa Cox, female    DOB: 06-06-1946, 76 y.o.   MRN: [id number]  HPI The patient is a 76 YO female coming in for burning pain random places. Started rare now daily over the last 3 months.   Review of Systems  Constitutional: Negative.   HENT: Negative.    Eyes: Negative.   Respiratory:  Negative for cough, chest tightness and shortness of breath.   Cardiovascular:  Negative for chest pain, palpitations and leg swelling.  Gastrointestinal:  Negative for abdominal distention, abdominal pain, constipation, diarrhea, nausea and vomiting.  Musculoskeletal: Negative.   Skin: Negative.   Neurological: Negative.        Burning pain  Psychiatric/Behavioral: Negative.      Objective:  Physical Exam Constitutional:      Appearance: She is well-developed.  HENT:     Head: Normocephalic and atraumatic.     Comments:  left ear canal impacted with copious hard wax, examination post ear lavage canal is clear and no bleeding or complications noted.   Cardiovascular:     Rate and Rhythm: Normal rate and regular rhythm.  Pulmonary:     Effort: Pulmonary effort is normal. No respiratory distress.     Breath sounds: Normal breath sounds. No wheezing or rales.  Abdominal:     General: Bowel sounds are normal. There is no distension.     Palpations: Abdomen is soft.     Tenderness: There is no abdominal tenderness. There is no rebound.  Musculoskeletal:     Cervical back: Normal range of motion.  Skin:    General: Skin is warm and dry.  Neurological:     Mental Status: She is alert and oriented to person, place, and time.     Coordination: Coordination normal.     Vitals:   05/22/22 1105  BP: 122/68  Pulse: 63  Resp: 18  SpO2: 98%  Weight: 154 lb 9.6 oz (70.1 kg)  Height: 5' 5.5" (1.664 m)    Assessment & Plan:

## 2022-05-22 NOTE — Assessment & Plan Note (Signed)
Checking HgA1c, vitamin D, B12, TSH, CBC, CMP to rule out metabolic causes. Not a consistent location making specific nerve impingement less likely than central or metabolic cause. If no cause detected can consider imaging.

## 2022-05-22 NOTE — Patient Instructions (Signed)
We have cleaned out the left ear and will check the labs.

## 2022-05-22 NOTE — Assessment & Plan Note (Signed)
Checking lipid panel and adjust lipitor 10 mg daily as needed. Goal LDL <100.

## 2022-05-22 NOTE — Assessment & Plan Note (Signed)
BP at goal on atenolol 50 mg BID and ramipril 20 mg daily. Checking CBC and CMP and adjust as needed.

## 2022-05-23 LAB — COMPREHENSIVE METABOLIC PANEL
ALT: 16 U/L (ref 0–35)
AST: 19 U/L (ref 0–37)
Albumin: 4.2 g/dL (ref 3.5–5.2)
Alkaline Phosphatase: 73 U/L (ref 39–117)
BUN: 18 mg/dL (ref 6–23)
CO2: 32 mEq/L (ref 19–32)
Calcium: 9.5 mg/dL (ref 8.4–10.5)
Chloride: 102 mEq/L (ref 96–112)
Creatinine, Ser: 1.03 mg/dL (ref 0.40–1.20)
GFR: 52.11 mL/min — ABNORMAL LOW (ref >60.00)
Glucose, Bld: 82 mg/dL (ref 70–99)
Potassium: 4.5 mEq/L (ref 3.5–5.1)
Sodium: 139 mEq/L (ref 135–145)
Total Bilirubin: 0.6 mg/dL (ref 0.2–1.2)
Total Protein: 7 g/dL (ref 6.0–8.3)

## 2022-05-23 LAB — LIPID PANEL
Cholesterol: 147 mg/dL (ref 0–200)
HDL: 39.6 mg/dL (ref >39.00)
LDL Cholesterol: 69 mg/dL (ref 0–99)
NonHDL: 107.62
Total CHOL/HDL Ratio: 4
Triglycerides: 192 mg/dL — ABNORMAL HIGH (ref 0.0–149.0)
VLDL: 38.4 mg/dL (ref 0.0–40.0)

## 2022-06-04 ENCOUNTER — Ambulatory Visit
Admission: RE | Admit: 2022-06-04 | Discharge: 2022-06-04 | Disposition: A | Discharge status: Home / Self Care | Payer: PPO | Source: Ambulatory Visit | Attending: Internal Medicine | Admitting: Internal Medicine

## 2022-06-04 DIAGNOSIS — Z1231 Encounter for screening mammogram for malignant neoplasm of breast: Secondary | ICD-10-CM

## 2022-06-04 HISTORY — DX: Malignant neoplasm of unspecified site of unspecified female breast: C50.919

## 2022-06-12 ENCOUNTER — Telehealth: Payer: Self-pay | Admitting: Internal Medicine

## 2022-07-11 ENCOUNTER — Other Ambulatory Visit: Payer: Self-pay | Admitting: Gastroenterology

## 2022-07-11 DIAGNOSIS — R1311 Dysphagia, oral phase: Secondary | ICD-10-CM | POA: Diagnosis not present

## 2022-07-11 DIAGNOSIS — R1310 Dysphagia, unspecified: Secondary | ICD-10-CM

## 2022-07-17 ENCOUNTER — Ambulatory Visit
Admission: RE | Admit: 2022-07-17 | Discharge: 2022-07-17 | Disposition: A | Discharge status: Home / Self Care | Payer: PPO | Source: Ambulatory Visit | Attending: Gastroenterology | Admitting: Gastroenterology

## 2022-07-17 DIAGNOSIS — K449 Diaphragmatic hernia without obstruction or gangrene: Secondary | ICD-10-CM | POA: Diagnosis not present

## 2022-07-17 DIAGNOSIS — R1310 Dysphagia, unspecified: Secondary | ICD-10-CM

## 2022-07-17 DIAGNOSIS — K224 Dyskinesia of esophagus: Secondary | ICD-10-CM | POA: Diagnosis not present

## 2022-07-26 DIAGNOSIS — Z01419 Encounter for gynecological examination (general) (routine) without abnormal findings: Secondary | ICD-10-CM | POA: Diagnosis not present

## 2022-07-26 DIAGNOSIS — Z6825 Body mass index (BMI) 25.0-25.9, adult: Secondary | ICD-10-CM | POA: Diagnosis not present

## 2022-08-17 ENCOUNTER — Ambulatory Visit: Payer: PPO | Admitting: Adult Health

## 2022-08-21 DIAGNOSIS — K2100 Gastro-esophageal reflux disease with esophagitis, without bleeding: Secondary | ICD-10-CM | POA: Diagnosis not present

## 2022-08-21 DIAGNOSIS — R1311 Dysphagia, oral phase: Secondary | ICD-10-CM | POA: Diagnosis not present

## 2022-08-30 DIAGNOSIS — Z1211 Encounter for screening for malignant neoplasm of colon: Secondary | ICD-10-CM | POA: Diagnosis not present

## 2022-09-06 NOTE — Progress Notes (Unsigned)
Cardiology Office Note:    Date:  09/07/2022  ID:  Lisa Cox, DOB 17-Oct-1946, MRN [id number]  PCP:  Hoyt Koch, MD   [facility] Cardiologist:  Glenetta Hew, MD Electrophysiologist:  [name], MD     Referring MD: Hoyt Koch, *   CC: Here for 6 month follow-up  History of Present Illness:    Lisa Cox is a 76 y.o. female with a hx of the following:   Hypercholesterolemia Frequent unifocal PVCs Hypertension Benign hypertensive heart disease without HF DOE   Previously had a PVC ablation in October 2020, did have left bundle branch block after ablation but this resolved.  Last seen by Dr. Ellyn Hack on December 20, 2021, and noted infrequent palpitations at that time, much improved than in the past.  Denied any symptoms with this.  BP averaging around 130s over 80s. Her shortness of breath with exertion was slightly more prominent, was found to be due to deconditioning.  Did note end of day fatigue, made her feel worn out. Noted occasional burning sensation across the chest, not associated with exertion.  Did note mild end of the day swelling, left greater than right and would usually subside after putting her feet up. Was told to follow-up in 6 months.    Today she presents for 54-monthfollow-up appointment. She states she is doing well.  However, she does report some burning in her chest, similar to her previous presentation at last office visit.  She is not sure if this is coming from her heart as she is having burning in other places, specifically she notes it along her right side of her abdomen, random and intermittent in nature, and not associated with exertion.  Also notices this pain in her throat.  Denies any shortness of breath, syncope, palpitations, presyncope, dizziness, orthopnea, PND, significant weight changes, acute bleeding, or claudication. Similar end of day swelling to lower extremities as last OV. Denies any  other questions or concerns.   Past Medical History:  Diagnosis Date   Abdominal pain, epigastric 11/12/2019   Acute cystitis with hematuria 10/29/2019   Anxiety    Bell's palsy 2009   Benign hypertensive heart disease without heart failure 07/11/2011   [facility] [place]    [place] [place]   breast,lumpectomy right side followed by radiation   Depression    Dysuria 10/29/2019   Elevated lipase 11/18/2019   Fatigue 05/26/2019   Frequent unifocal PVCs 04/02/2017   She was post of had an echocardiogram done which I don't see having been done. At this point, I think with her not having too many symptoms of PVCs we can watch for now and then maybe oracular follow-up.   GERD (gastroesophageal reflux disease)    Hypercholesterolemia 07/11/2011   Hyperlipidemia    Hyponatremia 11/18/2019   Insomnia 04/10/2016   Moderate aortic regurgitation    Nausea 11/12/2019   Personal history of radiation therapy    PVC (premature ventricular contraction) 10/31/2018   Frequent PVCs with some bigeminy noted on 48-hour monitor. No arrhythmias.==> s/p RVOT PVC ablation October 2020   Routine general medical examination at a health care facility 07/12/2017   UTI symptoms 10/02/2019    Past Surgical History:  Procedure Laterality Date   BREAST LUMPECTOMY Right Feb. 2011   right breast,followed by radiation therapy   CARDIAC EVENT MONITOR     Predominantly sinus rhythm with first-degree block. Minimum heart rate 63 bpm. Maximum heart rate 139 bpm.  Average is 85 bpm. 17.5% PVCs.  V bigem & Trigem noted.     EYE SURGERY  1953   skin removal in eye   GXT  04/2017   Exercise tolerance test off of beta-blockers: Normal blood pressure response to exercise.  No EKG changes to suggest ischemic changes.  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  Seen at rest, exercise and recovery.   NM MYOVIEW LTD  08/2018   LOW RISK. No ischemia or infarction   PVC ABLATION N/A 08/19/2019   Procedure: PVC  ABLATION;  Surgeon: Constance Haw, MD;  Location: [facility];  Service: Cardiovascular;  Laterality: N/A;   TRANSTHORACIC ECHOCARDIOGRAM  08/2018; 06/2020   a) EF 55-60%.  No RWMA. Gr 2 DD. Mod AI.; b) Normal LV size and function.  EF 55-60%.  Moderate asymmetric with basal septal hypertrophy.  Indeterminate diastolic pressures.  Small pericardial effusion, moderate AI.  Normal CVP.   TUBAL LIGATION  1978    Current Medications: Current Meds  Medication Sig   aspirin 81 MG tablet Take 81 mg by mouth every evening.   atenolol (TENORMIN) 50 MG tablet TAKE 1 TABLET BY MOUTH TWICE A DAY   atorvastatin (LIPITOR) 10 MG tablet Take 1 tablet (10 mg total) by mouth daily.   carboxymethylcellulose (REFRESH PLUS) 0.5 % SOLN Place 1 drop into both eyes 3 (three) times daily as needed (dry eye).    Cholecalciferol (VITAMIN D3) 50 MCG (2000 UT) TABS Take 2,000 Units by mouth daily.    dimenhyDRINATE (DRAMAMINE) 50 MG tablet Take 50 mg by mouth at bedtime. Must take with Lorazepam   famotidine (PEPCID) 20 MG tablet TAKE 1 TABLET (20 MG TOTAL) BY MOUTH AT BEDTIME AS NEEDED FOR HEARTBURN OR INDIGESTION.   ibuprofen (ADVIL) 200 MG tablet Take 600 mg by mouth every 6 (six) hours as needed for headache.   LORazepam (ATIVAN) 1 MG tablet TAKE 1 TABLET BY MOUTH EVERYDAY AT BEDTIME   Multiple Vitamins-Minerals (PRESERVISION AREDS 2) CAPS Take 1 capsule by mouth 2 (two) times daily.   ramipril (ALTACE) 10 MG capsule TAKE 2 CAPSULES BY MOUTH EVERY DAY   sodium chloride (OCEAN) 0.65 % SOLN nasal spray Place 1 spray into both nostrils as needed for congestion.     Allergies:   Hctz [hydrochlorothiazide], Amlodipine, Crestor [rosuvastatin calcium], Erythromycin, Macrobid [nitrofurantoin monohyd macro], Niacin and related, Restoril, and Zolpidem tartrate   Social History   Socioeconomic History   Marital status: Divorced    Spouse name: Not on file   Number of children: Not on file   Years of  education: Not on file   Highest education level: Not on file  Occupational History   Not on file  Tobacco Use   Smoking status: Never   Smokeless tobacco: Never  Substance and Sexual Activity   Alcohol use: No   Drug use: No   Sexual activity: Not Currently  Other Topics Concern   Not on file  Social History Narrative   Not on file   Social Determinants of Health   Financial Resource Strain: Not on file  Food Insecurity: Not on file  Transportation Needs: Not on file  Physical Activity: Not on file  Stress: Not on file  Social Connections: Not on file     Family History: The patient's family history includes Breast cancer (age of onset: 34) in her paternal aunt; Heart attack in her father; Heart disease in her father; Hypertension in her brother and sister;  Pancreatic cancer in her father; Stroke in her maternal aunt.  ROS:   Review of Systems  Constitutional: Negative.   HENT: Negative.    Eyes: Negative.   Respiratory: Negative.    Cardiovascular:  Positive for chest pain and leg swelling. Negative for palpitations, orthopnea, claudication and PND.       See HPI.   Gastrointestinal:  Positive for abdominal pain and heartburn. Negative for blood in stool, constipation, diarrhea, melena, nausea and vomiting.       See HPI.   Genitourinary: Negative.   Musculoskeletal: Negative.   Skin: Negative.   Neurological: Negative.   Endo/Heme/Allergies: Negative.   Psychiatric/Behavioral: Negative.      Please see the history of present illness.    All other systems reviewed and are negative.  EKGs/Labs/Other Studies Reviewed:    The following studies were reviewed today:   EKG:  EKG is ordered today.  The ekg ordered today demonstrates NSR, 66 bpm, no acute ischemic changes.   2D Echo complete on 07/11/2020: 1. Left ventricular ejection fraction, by estimation, is 55 to 60%. The  left ventricle has normal function. The left ventricle has no regional  wall motion  abnormalities. There is moderate asymmetric left ventricular  hypertrophy of the basal-septal  segment. Left ventricular diastolic parameters are indeterminate.   2. Right ventricular systolic function is normal. The right ventricular  size is normal. There is normal pulmonary artery systolic pressure.   3. A small pericardial effusion is present.   4. The mitral valve is normal in structure. Trivial mitral valve  regurgitation. No evidence of mitral stenosis.   5. The aortic valve is tricuspid. Aortic valve regurgitation is moderate.   6. The inferior vena cava is normal in size with greater than 50%  respiratory variability, suggesting right atrial pressure of 3 mmHg.   Conclusion(s)/Recommendation(s): Otherwise normal echocardiogram, with  minor abnormalities described in the report.  PVC ablation on 08/19/2019: 1. Sinus rhythm upon presentation.   2. Successful ablation of PVCs in the right ventricular outflow tract 3. No early apparent complications.   Myoview on 09/11/2018: There was no ST segment deviation noted during stress. The study is normal. This is a low risk study with no evidence of perfusion abnormalities. This study was not gated due to frequent ventricular ectopy.   Cardiac monitor on 08/13/2018: Predominantly sinus rhythm with first-degree block. Minimum heart rate 63 bpm. Maximum heart rate 139 bpm. Average is 85 bpm. Very rare PACs noted. (<1%) Frequent isolated PVCs (17.5%); Ventricular trigeminy and bigeminy noted on diary. No ventricular tachycardia or other arrhythmias. No pauses or bradycardia.   Significant amount of ventricular ectopy noted, but no arrhythmia.   Would probably warrant ischemic evaluation if not already done so.  Exercise Tolerance Test on 04/30/2017: Blood pressure demonstrated a normal response to exercise. There was no ST segment deviation noted during stress. No T wave inversion was noted during stress.   Normal ECG stress  test. Frequent monomorphic PVCs, likely of outflow tract origin, are seen at rest, during exercise and during recovery.  48 hour Holter monitor on 07/13/2015: 48 hour Holter monitor shows NSR with frequent PVCs, some runs of bigeminy PVCs. Rare interpolated PVC. Rare PAC. No VT. No atrial fibrillation. No bradyarrhythmias. No tachycardia.   Recent Labs: 05/22/2022: ALT 16; Hemoglobin 13.0; Platelets 175.0; TSH 5.11 09/07/2022: BUN 19; Creatinine, Ser 0.98; Potassium 4.7; Sodium 138  Recent Lipid Panel    Component Value Date/Time   CHOL 147 05/22/2022 1138  CHOL 158 06/23/2020 0928   TRIG 192.0 (H) 05/22/2022 1138   HDL 39.60 05/22/2022 1138   HDL 45 06/23/2020 0928   CHOLHDL 4 05/22/2022 1138   VLDL 38.4 05/22/2022 1138   LDLCALC 69 05/22/2022 1138   LDLCALC 90 06/23/2020 0928     Risk Assessment/Calculations:    The 10-year ASCVD risk score (Arnett DK, et al., 2019) is: 30%   Values used to calculate the score:     Age: 35 years     Sex: Female     Is Non-Hispanic African American: No     Diabetic: No     Tobacco smoker: No     Systolic Blood Pressure: 106 mmHg     Is BP treated: Yes     HDL Cholesterol: 39.6 mg/dL     Total Cholesterol: 147 mg/dL  Physical Exam:    VS:  BP 135/61   Pulse 66   Ht '5\' 5"'$  (1.651 m)   Wt 156 lb 6.4 oz (70.9 kg)   SpO2 96%   BMI 26.03 kg/m     Wt Readings from Last 3 Encounters:  09/07/22 156 lb 6.4 oz (70.9 kg)  05/22/22 154 lb 9.6 oz (70.1 kg)  12/26/21 155 lb 3.2 oz (70.4 kg)     GEN: Well nourished, well developed 76 y.o. Caucasian female in no acute distress HEENT: Normal NECK: No JVD; No carotid bruits CARDIAC: S1/S2, RRR, no murmurs, rubs, gallops; 2+ peripheral pulses throughout, strong and equal bilaterally RESPIRATORY:  Clear and diminished to auscultation without rales, wheezing or rhonchi  MUSCULOSKELETAL:  Nonpitting, dependent edema along BLE, otherwise normal; No deformity  SKIN: Thin skin, warm and  dry NEUROLOGIC:  Alert and oriented x 3 PSYCHIATRIC:  Normal affect   ASSESSMENT:    1. Burning chest pain   2. Gastroesophageal reflux disease, unspecified whether esophagitis present   3. Medication management   4. Frequent unifocal PVCs   5. Hypertension, unspecified type   6. Hypercholesterolemia   7. Leg edema    PLAN:    In order of problems listed above:  Burning chest pain, GERD, medication management Atypical chest pain, similar in presentation to last office visit with Dr. Ellyn Hack in March 2023, stable, and not bothersome per her report.  Primary care provider has her taking Pepcid 40 mg daily at nighttime to help with this.  History of taking omeprazole in the past.  Will initiate Protonix 40 mg twice daily x2 weeks then reduce to Protonix 40 mg daily thereafter. If symptoms do not improve with PPI by next OV, plan to consider ischemic evaluation. Continue Pepcid 40 mg daily at bedtime. Discussed to avoid NSAIDs for GI protection. Heart healthy diet and regular cardiovascular exercise encouraged.   Frequent PVC's, s/p PVC ablation Denies any palpitations or tachycardia. EKG overall unremarkable. Doing well on atenolol. Continue Atenolol 50 mg BID. Heart healthy diet and regular cardiovascular exercise encouraged.   HTN BP today, 146/76, repeat manual 135/61. Continue Atenolol and Ramipril. Heart healthy diet and regular cardiovascular exercise encouraged. Will obtain BMET today.   Hypercholesterolemia Lipid panel from 06/2022 revealed total cholesterol 147, TG 192, HDL 40, and LDL 69. Discussed to avoid fried and fatty foods. Heart healthy, low salt diet and regular cardiovascular exercise encouraged. Continue Lipitor. PCP to manage. Recommend that if triglycerides do not improve by next check after lifestyle changes, plan to initiate Fenofibrate 160 mg daily.   5. Bilateral lower extremity swelling Chronic, stable dependent, nonpitting lower extremity swelling,  similar to  last OV. Recommended compression stockings, leg elevation, [place] and Mediterranean diet information given to patient.   5. Disposition: Follow up with APP in 6 weeks or sooner if anything changes.    Medication Adjustments/Labs and Tests Ordered: Current medicines are reviewed at length with the patient today.  Concerns regarding medicines are outlined above.  Orders Placed This Encounter  Procedures   Basic metabolic panel   EKG [id number]   Meds ordered this encounter  Medications   pantoprazole (PROTONIX) 40 MG tablet    Sig: Take 1 tablet (40 mg total) by mouth daily. Take '40mg'$  BID X2 WEEKS THEN TAKE DAILY    Dispense:  45 tablet    Refill:  3    Patient Instructions  Medication Instructions:  Take PANTOPRAZOLE '40mg'$  TWICE DAILY X2 WEEKS THEN TAKE DAILY *If you need a refill on your cardiac medications before your next appointment, please call your pharmacy*  Lab Work: BMET TODAY If you have labs (blood work) drawn today and your tests are completely normal, you will receive your results only by:  [place] (if you have MyChart) OR  A paper copy in the mail  If you have any lab test that is abnormal or we need to change your treatment, we will call you to review the results.  Testing/Procedures: NONE   Follow-Up: At [facility], you and your health needs are our priority.  As part of our continuing mission to provide you with exceptional heart care, we have created designated Provider Care Teams.  These Care Teams include your primary Cardiologist (physician) and Advanced Practice Providers (APPs -  Physician Assistants and Nurse Practitioners) who all work together to provide you with the care you need, when you need it.  Your next appointment:   6 week(s)  The format for your next appointment:   In Person  Provider:   Coletta Memos, FNP or ANY APP     Other Instructions PLEASE PURCHASE AND WEAR COMPRESSION STOCKINGS DAILY AND TAKE OFF AT BEDTIME.  Compression stockings are elastic socks that squeeze the legs. They help to increase blood flow to the legs and to decrease swelling in the legs from fluid retention, and reduce the chance of developing blood clots in the lower legs. Please put on in the AM when dressing and off at night when dressing for bed.  LET THEM KNOW THAT YOU NEED KNEE HIGH'S WITH COMPRESSION OF 15-20 mmhg.  [employer]  [facility] [place] ([place] [phone]); [place], [place] [address]; [phone]  EMAIL   [email or web address].  PLEASE MAKE SURE TO ELEVATE YOUR FEET & LEGS WHILE SITTING, THIS WILL HELP WITH THE SWELLING ALSO.    Important Information About Sugar         Signed, Finis Bud, NP  09/08/2022 9:58 AM    [place]

## 2022-09-07 ENCOUNTER — Ambulatory Visit: Payer: PPO | Attending: Adult Health | Admitting: Nurse Practitioner

## 2022-09-07 VITALS — BP 135/61 | HR 66 | Ht 65.0 in | Wt 156.4 lb

## 2022-09-07 DIAGNOSIS — K219 Gastro-esophageal reflux disease without esophagitis: Secondary | ICD-10-CM

## 2022-09-07 DIAGNOSIS — I493 Ventricular premature depolarization: Secondary | ICD-10-CM | POA: Diagnosis not present

## 2022-09-07 DIAGNOSIS — R0789 Other chest pain: Secondary | ICD-10-CM | POA: Diagnosis not present

## 2022-09-07 DIAGNOSIS — I10 Essential (primary) hypertension: Secondary | ICD-10-CM

## 2022-09-07 DIAGNOSIS — Z79899 Other long term (current) drug therapy: Secondary | ICD-10-CM

## 2022-09-07 DIAGNOSIS — E7800 Pure hypercholesterolemia, unspecified: Secondary | ICD-10-CM | POA: Diagnosis not present

## 2022-09-07 DIAGNOSIS — R600 Localized edema: Secondary | ICD-10-CM | POA: Diagnosis not present

## 2022-09-07 MED ORDER — PANTOPRAZOLE SODIUM 40 MG PO TBEC: 40.0000 mg | DELAYED_RELEASE_TABLET | Freq: Every day | ORAL | 3 refills | Status: DC

## 2022-09-07 NOTE — Patient Instructions (Signed)
Medication Instructions:  Take PANTOPRAZOLE '40mg'$  TWICE DAILY X2 WEEKS THEN TAKE DAILY *If you need a refill on your cardiac medications before your next appointment, please call your pharmacy*  Lab Work: BMET TODAY If you have labs (blood work) drawn today and your tests are completely normal, you will receive your results only by:  [place] (if you have MyChart) OR  A paper copy in the mail  If you have any lab test that is abnormal or we need to change your treatment, we will call you to review the results.  Testing/Procedures: NONE   Follow-Up: At [facility], you and your health needs are our priority.  As part of our continuing mission to provide you with exceptional heart care, we have created designated Provider Care Teams.  These Care Teams include your primary Cardiologist (physician) and Advanced Practice Providers (APPs -  Physician Assistants and Nurse Practitioners) who all work together to provide you with the care you need, when you need it.  Your next appointment:   6 week(s)  The format for your next appointment:   In Person  Provider:   Coletta Memos, FNP or ANY APP     Other Instructions PLEASE PURCHASE AND WEAR COMPRESSION STOCKINGS DAILY AND TAKE OFF AT BEDTIME. Compression stockings are elastic socks that squeeze the legs. They help to increase blood flow to the legs and to decrease swelling in the legs from fluid retention, and reduce the chance of developing blood clots in the lower legs. Please put on in the AM when dressing and off at night when dressing for bed.  LET THEM KNOW THAT YOU NEED KNEE HIGH'S WITH COMPRESSION OF 15-20 mmhg.  [employer];  [place] ([place] [phone]); [place], [place] [phone]; [phone]  EMAIL   [email or web address].  PLEASE MAKE SURE TO ELEVATE YOUR FEET & LEGS WHILE SITTING, THIS WILL HELP WITH THE SWELLING ALSO.    Important Information About Sugar

## 2022-09-08 LAB — BASIC METABOLIC PANEL
BUN/Creatinine Ratio: 19 (ref 12–28)
BUN: 19 mg/dL (ref 8–27)
CO2: 27 mmol/L (ref 20–29)
Calcium: 8.9 mg/dL (ref 8.7–10.3)
Chloride: 101 mmol/L (ref 96–106)
Creatinine, Ser: 0.98 mg/dL (ref 0.57–1.00)
Glucose: 81 mg/dL (ref 70–99)
Potassium: 4.7 mmol/L (ref 3.5–5.2)
Sodium: 138 mmol/L (ref 134–144)
eGFR: 59 mL/min/{1.73_m2} — ABNORMAL LOW (ref >59)

## 2022-09-18 LAB — COLOGUARD: Cologuard: NEGATIVE

## 2022-10-17 NOTE — Progress Notes (Deleted)
Cardiology Office Note Date:  10/17/2022  Patient ID:  Lisa Cox, Lisa Cox 1946/02/09, MRN [id number] PCP:  Hoyt Koch, MD  Cardiologist:  Glenetta Hew, MD Electrophysiologist: [name], MD  ***refresh   Chief Complaint: 76yrf/up; PVCs  History of Present Illness: Lisa HEATWOLEis a 76y.o. female seen today for [name] MD for {VISITTYPE:28148}   Last saw Dr. CCurt BearsJan 2023, occasional skipped beats, nothing like pre-ablation. S/p PVC ablation 07/2019. Doing well on atenolol  [facility] having ongoing, intermittent burning chest and R abd pain. Not associated with exertion. BLE edema at end of day. Started on protonix + cont to take pepcid.   *** chest pain/burning *** PVC burden *** tolerating atenolol *** fluid status *** HTN   AAD History: Failed flecainide, dilt, and mexiletine  Past Medical History:  Diagnosis Date   Abdominal pain, epigastric 11/12/2019   Acute cystitis with hematuria 10/29/2019   Anxiety    Bell's palsy 2009   Benign hypertensive heart disease without heart failure 07/11/2011   [place]   breast,lumpectomy right side followed by radiation   Depression    Dysuria 10/29/2019   Elevated lipase 11/18/2019   Fatigue 05/26/2019   Frequent unifocal PVCs 04/02/2017   She was post of had an echocardiogram done which I don't see having been done. At this point, I think with her not having too many symptoms of PVCs we can watch for now and then maybe oracular follow-up.   GERD (gastroesophageal reflux disease)    Hypercholesterolemia 07/11/2011   Hyperlipidemia    Hyponatremia 11/18/2019   Insomnia 04/10/2016   Moderate aortic regurgitation    Nausea 11/12/2019   Personal history of radiation therapy    PVC (premature ventricular contraction) 10/31/2018   Frequent PVCs with some bigeminy noted on 48-hour monitor. No arrhythmias.==> s/p RVOT PVC ablation  October 2020   Routine general medical examination at a health care facility 07/12/2017   UTI symptoms 10/02/2019    Past Surgical History:  Procedure Laterality Date   BREAST LUMPECTOMY Right Feb. 2011   right breast,followed by radiation therapy   CARDIAC EVENT MONITOR     Predominantly sinus rhythm with first-degree block. Minimum heart rate 63 bpm. Maximum heart rate 139 bpm. Average is 85 bpm. 17.5% PVCs.  V bigem & Trigem noted.     EYE SURGERY  1953   skin removal in eye   GXT  04/2017   Exercise tolerance test off of beta-blockers: Normal blood pressure response to exercise.  No EKG changes to suggest ischemic changes.  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  Seen at rest, exercise and recovery.   NM MYOVIEW LTD  08/2018   LOW RISK. No ischemia or infarction   PVC ABLATION N/A 08/19/2019   Procedure: PVC ABLATION;  Surgeon: CConstance Haw MD;  [facility];  Service: Cardiovascular;  Laterality: N/A;   TRANSTHORACIC ECHOCARDIOGRAM  08/2018; 06/2020   a) EF 55-60%.  No RWMA. Gr 2 DD. Mod AI.; b) Normal LV size and function.  EF 55-60%.  Moderate asymmetric with basal septal hypertrophy.  Indeterminate diastolic pressures.  Small pericardial effusion, moderate AI.  Normal CVP.   TUBAL LIGATION  1978    Current Outpatient Medications  Medication Sig Dispense Refill   aspirin 81 MG tablet Take 81 mg by mouth every evening.     atenolol (TENORMIN) 50  MG tablet TAKE 1 TABLET BY MOUTH TWICE A DAY 180 tablet 3   atorvastatin (LIPITOR) 10 MG tablet Take 1 tablet (10 mg total) by mouth daily. 90 tablet 3   carboxymethylcellulose (REFRESH PLUS) 0.5 % SOLN Place 1 drop into both eyes 3 (three) times daily as needed (dry eye).      Cholecalciferol (VITAMIN D3) 50 MCG (2000 UT) TABS Take 2,000 Units by mouth daily.      dimenhyDRINATE (DRAMAMINE) 50 MG tablet Take 50 mg by mouth at bedtime. Must take with Lorazepam     famotidine (PEPCID) 20 MG tablet TAKE  1 TABLET (20 MG TOTAL) BY MOUTH AT BEDTIME AS NEEDED FOR HEARTBURN OR INDIGESTION. 90 tablet 3   ibuprofen (ADVIL) 200 MG tablet Take 600 mg by mouth every 6 (six) hours as needed for headache.     LORazepam (ATIVAN) 1 MG tablet TAKE 1 TABLET BY MOUTH EVERYDAY AT BEDTIME 90 tablet 1   Multiple Vitamins-Minerals (PRESERVISION AREDS 2) CAPS Take 1 capsule by mouth 2 (two) times daily.     pantoprazole (PROTONIX) 40 MG tablet Take 1 tablet (40 mg total) by mouth daily. Take '40mg'$  BID X2 WEEKS THEN TAKE DAILY 45 tablet 3   ramipril (ALTACE) 10 MG capsule TAKE 2 CAPSULES BY MOUTH EVERY DAY 180 capsule 3   sodium chloride (OCEAN) 0.65 % SOLN nasal spray Place 1 spray into both nostrils as needed for congestion.     sulfamethoxazole-trimethoprim (BACTRIM DS) 800-160 MG tablet Take 1 tablet by mouth 2 (two) times daily. (Patient not taking: Reported on 09/07/2022) 10 tablet 0   No current facility-administered medications for this visit.    Allergies:   Hctz [hydrochlorothiazide], Amlodipine, Crestor [rosuvastatin calcium], Erythromycin, Macrobid [nitrofurantoin monohyd macro], Niacin and related, Restoril, and Zolpidem tartrate   Social History:  The patient  reports that she has never smoked. She has never used smokeless tobacco. She reports that she does not drink alcohol and does not use drugs.   Family History:  The patient's family history includes Breast cancer (age of onset: 73) in her paternal aunt; Heart attack in her father; Heart disease in her father; Hypertension in her brother and sister; Pancreatic cancer in her father; Stroke in her maternal aunt.***  ROS:  Please see the history of present illness. All other systems are reviewed and otherwise negative.   PHYSICAL EXAM: *** VS:  There were no vitals taken for this visit. BMI: There is no height or weight on file to calculate BMI.  GEN- The patient is well appearing, alert and oriented x 3 today.   HEENT: normocephalic, atraumatic;  sclera clear, conjunctiva pink; hearing intact; oropharynx clear; neck supple, no JVP Lungs- Clear to ausculation bilaterally, normal work of breathing.  No wheezes, rales, rhonchi Heart- {Blank single:19197::"Regular","Irregularly irregular"} rate and rhythm, no murmurs, rubs or gallops, PMI not laterally displaced GI- soft, non-tender, non-distended, bowel sounds present, no hepatosplenomegaly Extremities- {EDEMA FBPZW:25852} peripheral edema. no clubbing or cyanosis; DP/PT/radial pulses 2+ bilaterally MS- no significant deformity or atrophy Skin- warm and dry, no rash or lesion Psych- euthymic mood, full affect Neuro- strength and sensation are intact   EKG is not ordered. Personal review of EKG from  09/07/2022  shows:  NSR w 1st degree AV block, rate 66; stable from previous  Recent Labs: 05/22/2022: ALT 16; Hemoglobin 13.0; Platelets 175.0; TSH 5.11 09/07/2022: BUN 19; Creatinine, Ser 0.98; Potassium 4.7; Sodium 138  05/22/2022: Cholesterol 147; HDL 39.60; LDL Cholesterol 69; Total CHOL/HDL Ratio  4; Triglycerides 192.0; VLDL 38.4   CrCl cannot be calculated (Patient's most recent lab result is older than the maximum 21 days allowed.).   Wt Readings from Last 3 Encounters:  09/07/22 156 lb 6.4 oz (70.9 kg)  05/22/22 154 lb 9.6 oz (70.1 kg)  12/26/21 155 lb 3.2 oz (70.4 kg)     Additional studies reviewed include: Previous EP and cardiology notes.   Echo 07/11/2020  1. Left ventricular ejection fraction, by estimation, is 55 to 60%. The left ventricle has normal function. The left ventricle has no regional wall motion abnormalities. There is moderate asymmetric left ventricular hypertrophy of the basal-septal  segment. Left ventricular diastolic parameters are indeterminate.   2. Right ventricular systolic function is normal. The right ventricular size is normal. There is normal pulmonary artery systolic pressure.   3. A small pericardial effusion is present.   4. The mitral valve  is normal in structure. Trivial mitral valve regurgitation. No evidence of mitral stenosis.   5. The aortic valve is tricuspid. Aortic valve regurgitation is moderate.   6. The inferior vena cava is normal in size with greater than 50% respiratory variability, suggesting right atrial pressure of 3 mmHg.   PVC Ablation (07/2019) CONCLUSIONS: 1. Sinus rhythm upon presentation.   2. Successful ablation of PVCs in the right ventricular outflow tract 3. No early apparent complications.    Long Term Zio (08/13/18) Predominantly sinus rhythm with first-degree block. Minimum heart rate 63 bpm. Maximum heart rate 139 bpm. Average is 85 bpm. Very rare PACs noted. (<1%) Frequent isolated PVCs (17.5%); Ventricular trigeminy and bigeminy noted on diary. No ventricular tachycardia or other arrhythmias. No pauses or bradycardia.   Significant amount of ventricular ectopy noted, but no arrhythmia.  ASSESSMENT AND PLAN:  #) PVCs s/p PVC ablation   #) HTN #) ***    Current medicines are reviewed at length with the patient today.   The patient {ACTIONS; HAS/DOES NOT HAVE:19233} concerns regarding her medicines.  The following changes were made today:  {NONE DEFAULTED:18576}  Labs/ tests ordered today include: *** No orders of the defined types were placed in this encounter.    Disposition: Follow up with {EPMDS:28135} in {EPFOLLOW UP:28173}   Signed, Mamie Levers, NP  10/17/22 3:18 PM   [facility] [place] [address] [phone] (office)  [phone] (fax)

## 2022-10-19 ENCOUNTER — Ambulatory Visit: Payer: PPO | Attending: Physician Assistant | Admitting: Physician Assistant

## 2022-10-19 ENCOUNTER — Encounter: Payer: Self-pay | Admitting: Physician Assistant

## 2022-10-19 VITALS — BP 132/74 | HR 85 | Ht 65.0 in | Wt 157.0 lb

## 2022-10-19 DIAGNOSIS — I10 Essential (primary) hypertension: Secondary | ICD-10-CM

## 2022-10-19 DIAGNOSIS — R0789 Other chest pain: Secondary | ICD-10-CM | POA: Diagnosis not present

## 2022-10-19 DIAGNOSIS — I351 Nonrheumatic aortic (valve) insufficiency: Secondary | ICD-10-CM | POA: Diagnosis not present

## 2022-10-19 DIAGNOSIS — Z79899 Other long term (current) drug therapy: Secondary | ICD-10-CM

## 2022-10-19 MED ORDER — VALSARTAN 160 MG PO TABS: 160.0000 mg | ORAL_TABLET | Freq: Every day | ORAL | 0 refills | Status: DC

## 2022-10-19 NOTE — Progress Notes (Unsigned)
Cardiology Office Note:    Date:  10/19/2022   ID:  Lisa Cox, DOB 1945-10-26, MRN [id number]  PCP:  Hoyt Koch, MD   [place] [facility] Cardiologist:  Glenetta Hew, MD Electrophysiologist:  [name], MD { Click to update primary MD,subspecialty MD or APP then REFRESH:1}    Referring MD: Hoyt Koch, *   No chief complaint on file. ***  History of Present Illness:    Lisa Cox is a 76 y.o. female with a hx of frequent PVCs, hypertension, hyperlipidemia and history of moderate aortic regurgitation.  Patient underwent PVC ablation in October 2020, initially had left bundle branch block after the ablation, this was later resolved.  He was seen by Dr. Ellyn Hack in March 2023 and noted he frequenting a palpitation at the time and occasional burning sensation.  More recently, patient was seen by Finis Bud, NP on 09/07/2022 at which time he complained of occasional burning sensation across the chest.  However the symptom was not associated with physical exertion.  She was started on a trial of pantoprazole 40 mg twice a day for 2 weeks then decrease it down to 40 mg daily thereafter.  Patient presents today for follow-up.  She still has occasional burning sensation, it does not change with physical activity.  There is no increase in frequency, duration or intensity.  Initially we recommended a coronary CT versus PET stress test, she is hesitant to proceed at this time due to cost.  She is agreeable to do echocardiogram to make sure her aortic valve has not worsened.  She is also concerned that her ramipril is not controlling her blood pressure very well and that she keep having spikes in her blood pressure.  Her brother recently was placed on valsartan and did very well, she wished to switch her ramipril to valsartan as a trial as well.  She is on the highest dose of ramipril, I am hesitant to switch her to the highest dose of valsartan, I  will switch her to valsartan 160 mg daily.  If after 2 weeks, systolic blood pressure is still greater than 135 mmHg, she has been instructed to increase valsartan further to 320 mg daily.  She will need a basic metabolic panel in 3 weeks.  Follow-up with cardiology service in 6 weeks for reassessment.  Past Medical History:  Diagnosis Date   Abdominal pain, epigastric 11/12/2019   Acute cystitis with hematuria 10/29/2019   Anxiety    Bell's palsy 2009   Benign hypertensive heart disease without heart failure 07/11/2011   Breast cancer ([place])    Cancer ([place]) 11/2009   breast,lumpectomy right side followed by radiation   Depression    Dysuria 10/29/2019   Elevated lipase 11/18/2019   Fatigue 05/26/2019   Frequent unifocal PVCs 04/02/2017   She was post of had an echocardiogram done which I don't see having been done. At this point, I think with her not having too many symptoms of PVCs we can watch for now and then maybe oracular follow-up.   GERD (gastroesophageal reflux disease)    Hypercholesterolemia 07/11/2011   Hyperlipidemia    Hyponatremia 11/18/2019   Insomnia 04/10/2016   Moderate aortic regurgitation    Nausea 11/12/2019   Personal history of radiation therapy    PVC (premature ventricular contraction) 10/31/2018   Frequent PVCs with some bigeminy noted on 48-hour monitor. No arrhythmias.==> s/p RVOT PVC ablation October 2020   Routine general medical examination at  a health care facility 07/12/2017   UTI symptoms 10/02/2019    Past Surgical History:  Procedure Laterality Date   BREAST LUMPECTOMY Right Feb. 2011   right breast,followed by radiation therapy   CARDIAC EVENT MONITOR     Predominantly sinus rhythm with first-degree block. Minimum heart rate 63 bpm. Maximum heart rate 139 bpm. Average is 85 bpm. 17.5% PVCs.  V bigem & Trigem noted.     EYE SURGERY  1953   skin removal in eye   GXT  04/2017   Exercise tolerance test off of beta-blockers: Normal blood  pressure response to exercise.  No EKG changes to suggest ischemic changes.  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  Seen at rest, exercise and recovery.   NM MYOVIEW LTD  08/2018   LOW RISK. No ischemia or infarction   PVC ABLATION N/A 08/19/2019   Procedure: PVC ABLATION;  Surgeon: Constance Haw, MD;  Location: [place] [facility];  Service: Cardiovascular;  Laterality: N/A;   TRANSTHORACIC ECHOCARDIOGRAM  08/2018; 06/2020   a) EF 55-60%.  No RWMA. Gr 2 DD. Mod AI.; b) Normal LV size and function.  EF 55-60%.  Moderate asymmetric with basal septal hypertrophy.  Indeterminate diastolic pressures.  Small pericardial effusion, moderate AI.  Normal CVP.   TUBAL LIGATION  1978    Current Medications: Current Meds  Medication Sig   aspirin 81 MG tablet Take 81 mg by mouth every evening.   atenolol (TENORMIN) 50 MG tablet TAKE 1 TABLET BY MOUTH TWICE A DAY   atorvastatin (LIPITOR) 10 MG tablet Take 1 tablet (10 mg total) by mouth daily.   carboxymethylcellulose (REFRESH PLUS) 0.5 % SOLN Place 1 drop into both eyes 3 (three) times daily as needed (dry eye).    Cholecalciferol (VITAMIN D3) 50 MCG (2000 UT) TABS Take 2,000 Units by mouth daily.    dimenhyDRINATE (DRAMAMINE) 50 MG tablet Take 50 mg by mouth at bedtime. Must take with Lorazepam   famotidine (PEPCID) 20 MG tablet TAKE 1 TABLET (20 MG TOTAL) BY MOUTH AT BEDTIME AS NEEDED FOR HEARTBURN OR INDIGESTION.   ibuprofen (ADVIL) 200 MG tablet Take 600 mg by mouth every 6 (six) hours as needed for headache.   LORazepam (ATIVAN) 1 MG tablet TAKE 1 TABLET BY MOUTH EVERYDAY AT BEDTIME   Multiple Vitamins-Minerals (PRESERVISION AREDS 2) CAPS Take 1 capsule by mouth 2 (two) times daily.   ramipril (ALTACE) 10 MG capsule TAKE 2 CAPSULES BY MOUTH EVERY DAY   sodium chloride (OCEAN) 0.65 % SOLN nasal spray Place 1 spray into both nostrils as needed for congestion.   [DISCONTINUED] pantoprazole (PROTONIX) 40 MG tablet Take 1 tablet (40 mg  total) by mouth daily. Take '40mg'$  BID X2 WEEKS THEN TAKE DAILY     Allergies:   Hctz [hydrochlorothiazide], Amlodipine, Crestor [rosuvastatin calcium], Erythromycin, Macrobid [nitrofurantoin monohyd macro], Niacin and related, Restoril, and Zolpidem tartrate   Social History   Socioeconomic History   Marital status: Divorced    Spouse name: Not on file   Number of children: Not on file   Years of education: Not on file   Highest education level: Not on file  Occupational History   Not on file  Tobacco Use   Smoking status: Never   Smokeless tobacco: Never  Substance and Sexual Activity   Alcohol use: No   Drug use: No   Sexual activity: Not Currently  Other Topics Concern   Not on file  Social History Narrative  Not on file   Social Determinants of Health   Financial Resource Strain: Not on file  Food Insecurity: Not on file  Transportation Needs: Not on file  Physical Activity: Not on file  Stress: Not on file  Social Connections: Not on file     Family History: The patient's ***family history includes Breast cancer (age of onset: 34) in her paternal aunt; Heart attack in her father; Heart disease in her father; Hypertension in her brother and sister; Pancreatic cancer in her father; Stroke in her maternal aunt.  ROS:   Please see the history of present illness.    *** All other systems reviewed and are negative.  EKGs/Labs/Other Studies Reviewed:    The following studies were reviewed today: ***  EKG:  EKG is *** ordered today.  The ekg ordered today demonstrates ***  Recent Labs: 05/22/2022: ALT 16; Hemoglobin 13.0; Platelets 175.0; TSH 5.11 09/07/2022: BUN 19; Creatinine, Ser 0.98; Potassium 4.7; Sodium 138  Recent Lipid Panel    Component Value Date/Time   CHOL 147 05/22/2022 1138   CHOL 158 06/23/2020 0928   TRIG 192.0 (H) 05/22/2022 1138   HDL 39.60 05/22/2022 1138   HDL 45 06/23/2020 0928   CHOLHDL 4 05/22/2022 1138   VLDL 38.4 05/22/2022 1138    LDLCALC 69 05/22/2022 1138   LDLCALC 90 06/23/2020 0928     Risk Assessment/Calculations:   {Does this patient have ATRIAL FIBRILLATION?[phone]}       Physical Exam:    VS:  BP 132/74 (BP Location: Left Arm, Patient Position: Sitting, Cuff Size: Normal)   Pulse 85   Ht '5\' 5"'$  (1.651 m)   Wt 157 lb (71.2 kg)   SpO2 96%   BMI 26.13 kg/m         Wt Readings from Last 3 Encounters:  10/19/22 157 lb (71.2 kg)  09/07/22 156 lb 6.4 oz (70.9 kg)  05/22/22 154 lb 9.6 oz (70.1 kg)     GEN: *** Well nourished, well developed in no acute distress HEENT: Normal NECK: No JVD; No carotid bruits LYMPHATICS: No lymphadenopathy CARDIAC: ***RRR, no murmurs, rubs, gallops RESPIRATORY:  Clear to auscultation without rales, wheezing or rhonchi  ABDOMEN: Soft, non-tender, non-distended MUSCULOSKELETAL:  No edema; No deformity  SKIN: Warm and dry NEUROLOGIC:  Alert and oriented x 3 PSYCHIATRIC:  Normal affect   ASSESSMENT:    No diagnosis found. PLAN:    In order of problems listed above:  ***      {Are you ordering a CV Procedure (e.g. stress test, cath, DCCV, TEE, etc)?   Press F2        :[id number]}    Medication Adjustments/Labs and Tests Ordered: Current medicines are reviewed at length with the patient today.  Concerns regarding medicines are outlined above.  No orders of the defined types were placed in this encounter.  No orders of the defined types were placed in this encounter.   There are no Patient Instructions on file for this visit.   Hilbert Corrigan, [place]  10/19/2022 2:57 PM    [place]

## 2022-10-19 NOTE — Patient Instructions (Signed)
Medication Instructions:   STOP Ramipril  START Valsartan 160 mg daily. Monitor blood pressure for 2 weeks, if your systolic (top number) is greater than 135 mmHg INCREASE Valsartan to 320 mg (2 tablets) daily and give our office a call  *If you need a refill on your cardiac medications before your next appointment, please call your pharmacy*  Lab Work: Your physician recommends that you return for lab work in 3 weeks:  BMP  If you have labs (blood work) drawn today and your tests are completely normal, you will receive your results only by: [place] (if you have MyChart) OR A paper copy in the mail If you have any lab test that is abnormal or we need to change your treatment, we will call you to review the results.  Testing/Procedures: Your physician has requested that you have an echocardiogram. Echocardiography is a painless test that uses sound waves to create images of your heart. It provides your doctor with information about the size and shape of your heart and how well your heart's chambers and valves are working. This procedure takes approximately one hour. There are no restrictions for this procedure. Please do NOT wear cologne, perfume, aftershave, or lotions (deodorant is allowed). Please arrive 15 minutes prior to your appointment time.  Follow-Up: At [facility], you and your health needs are our priority.  As part of our continuing mission to provide you with exceptional heart care, we have created designated Provider Care Teams.  These Care Teams include your primary Cardiologist (physician) and Advanced Practice Providers (APPs -  Physician Assistants and Nurse Practitioners) who all work together to provide you with the care you need, when you need it.  Your next appointment:   6 week(s)  The format for your next appointment:   In Person  Provider:   APP        Other Instructions   Important Information About Sugar

## 2022-10-21 ENCOUNTER — Encounter: Payer: Self-pay | Admitting: Physician Assistant

## 2022-10-22 HISTORY — PX: TRANSTHORACIC ECHOCARDIOGRAM: SHX275

## 2022-10-23 ENCOUNTER — Ambulatory Visit: Payer: PPO | Admitting: Physician Assistant

## 2022-10-23 DIAGNOSIS — I10 Essential (primary) hypertension: Secondary | ICD-10-CM

## 2022-10-23 DIAGNOSIS — I493 Ventricular premature depolarization: Secondary | ICD-10-CM

## 2022-10-24 ENCOUNTER — Telehealth: Payer: Self-pay | Admitting: Cardiology

## 2022-10-24 NOTE — Telephone Encounter (Signed)
Spoke to patient.Stated she wanted Dr.Harding to know she increased Valsartan to 160 mg twice a day yesterday.Stated her B/P was 192/81,179/76.Today 153/71 pulse 75.She will continue Valsartan 160 mg twice a day.She will monitor B/P daily and send readings through mychart in 1 week.I will make Dr.Harding aware.

## 2022-10-24 NOTE — Telephone Encounter (Signed)
Spoke to patient Dr.Harding advised ok to take Valsartan 160 mg twice a day.She will send B/P readings to mychart in 1 week.

## 2022-10-24 NOTE — Telephone Encounter (Signed)
OK ° °DH °

## 2022-10-24 NOTE — Telephone Encounter (Signed)
Pt c/o medication issue:  1. Name of Medication: valsartan (DIOVAN) 160 MG tablet   2. How are you currently taking this medication (dosage and times per day)? Take 1 tablet (160 mg total) by mouth daily.   3. Are you having a reaction (difficulty breathing--STAT)? no  4. What is your medication issue? Calling to say that she had to increase the medication to 360 mg daily. Calling to let our office now. Her BP 192/81

## 2022-10-24 NOTE — Telephone Encounter (Signed)
Pt is returning call.  

## 2022-10-24 NOTE — Telephone Encounter (Signed)
Returned call to patient no answer.Left message on personal voice mail to call back. 

## 2022-11-02 NOTE — Telephone Encounter (Signed)
Patient stated she has been on Diovan '160mg'$  twice daily and he SBP is above 135. Bps have been 192/83, 169/78. Stated when her BP is elevated, she feels tightness in hear head. She drinks hot chocolate everyday. Recommended she stay hydrated. Please advise on BP.

## 2022-11-02 NOTE — Telephone Encounter (Signed)
Patient returning call.

## 2022-11-02 NOTE — Telephone Encounter (Signed)
LMTCB.

## 2022-11-02 NOTE — Telephone Encounter (Signed)
Pt c/o medication issue:  1. Name of Medication:  valsartan (DIOVAN) 160 MG tablet  2. How are you currently taking this medication (dosage and times per day)?  Twice daily, as prescribed  3. Are you having a reaction (difficulty breathing--STAT)?   4. What is your medication issue?   Patient is following up. She states Valsartan is not controlling her BP. This morning her BP was 192/83. She took her medication and checked her BP 1 hour later and it was 169/78. She states she has also been feeling a tightness in her head--no pain. No additional symptoms.

## 2022-11-03 NOTE — Telephone Encounter (Signed)
Should be ok to put on schedule  [facility]

## 2022-11-05 MED ORDER — RAMIPRIL 10 MG PO CAPS: 10.0000 mg | ORAL_CAPSULE | Freq: Two times a day (BID) | ORAL | 3 refills | Status: DC

## 2022-11-05 NOTE — Addendum Note (Signed)
Addended by: Betha Loa F on: 11/05/2022 09:12 AM   Modules accepted: Orders

## 2022-11-05 NOTE — Telephone Encounter (Signed)
Spoke with the patient over the phone.  Initially, we switched her from ramipril to valsartan as her brother did well on the valsartan and she did not think ramipril was keeping her blood pressure in control of her well as her blood pressure was running high at home.  Interestingly, every time she comes to the doctor's office her blood pressure was normal while on the ramipril previously.  She has since uptitrated valsartan from 160 mg daily to 160 mg twice a day which is the highest dose for the valsartan.  This has not controlled her blood pressure very well, systolic blood pressure ranges from 150-190s.  She does have a headache when her blood pressure becomes high.  Since last weekend, she has switched back to ramipril 10 mg twice a day which was her previous dose.  I am fine with this decision.  It is curious why her blood pressure would be high at home but normal in the doctor's office.  We will update her med list to reflect the changes.  I asked her to keep a blood pressure diary with 2 readings per day, first reading should be 2 hours after her morning medication, second reading should be at a fixed time every night.  I also asked her to bring her home blood pressure cuff with her to the next visit.  She should also bring her medication bottles with her as well when she see Dr. Ellyn Hack next Wednesday.

## 2022-11-05 NOTE — Telephone Encounter (Addendum)
Called patient to schedule appointment with Dr. Ellyn Hack. Made appointment for 1/24. Patient stated her BP is still elevated, and this past Friday, she strared taking valsartan and went back on ramipril '10mg'$  twice daily. BP while on phone was 189/90, P 73 She denies chest pain, headache, dizziness, sob. Went to speak with Janan Ridge, PA-C. He called patient. Orders: discontinue valsartan, take ramipril '10mg'$  twice daily, check AM BP 2 hours after taking ramipril and take BP in the evenings at the same. Bring BP diary to next appointment, bring all medications to next appointment, and bring BP cuff for calibration to next appointment.

## 2022-11-08 DIAGNOSIS — Z79899 Other long term (current) drug therapy: Secondary | ICD-10-CM | POA: Diagnosis not present

## 2022-11-08 LAB — BASIC METABOLIC PANEL
BUN/Creatinine Ratio: 13 (ref 12–28)
BUN: 14 mg/dL (ref 8–27)
CO2: 29 mmol/L (ref 20–29)
Calcium: 9.5 mg/dL (ref 8.7–10.3)
Chloride: 100 mmol/L (ref 96–106)
Creatinine, Ser: 1.08 mg/dL — ABNORMAL HIGH (ref 0.57–1.00)
Glucose: 97 mg/dL (ref 70–99)
Potassium: 4.1 mmol/L (ref 3.5–5.2)
Sodium: 140 mmol/L (ref 134–144)
eGFR: 53 mL/min/{1.73_m2} — ABNORMAL LOW (ref >59)

## 2022-11-14 ENCOUNTER — Ambulatory Visit: Payer: PPO | Admitting: Cardiology

## 2022-11-15 ENCOUNTER — Other Ambulatory Visit (HOSPITAL_COMMUNITY): Payer: PPO

## 2022-11-20 ENCOUNTER — Ambulatory Visit (HOSPITAL_COMMUNITY): Payer: PPO | Attending: Physician Assistant

## 2022-11-20 DIAGNOSIS — I351 Nonrheumatic aortic (valve) insufficiency: Secondary | ICD-10-CM | POA: Diagnosis not present

## 2022-11-21 LAB — ECHOCARDIOGRAM COMPLETE
Area-P 1/2: 3.66 cm2
P 1/2 time: 376 msec
S' Lateral: 2.4 cm

## 2022-11-22 ENCOUNTER — Other Ambulatory Visit: Payer: Self-pay | Admitting: Internal Medicine

## 2022-11-27 ENCOUNTER — Ambulatory Visit: Payer: PPO | Attending: Cardiology | Admitting: Cardiology

## 2022-11-27 ENCOUNTER — Encounter: Payer: Self-pay | Admitting: Cardiology

## 2022-11-27 VITALS — BP 162/72 | HR 71 | Ht 65.0 in | Wt 156.8 lb

## 2022-11-27 DIAGNOSIS — I119 Hypertensive heart disease without heart failure: Secondary | ICD-10-CM

## 2022-11-27 DIAGNOSIS — Z79899 Other long term (current) drug therapy: Secondary | ICD-10-CM | POA: Diagnosis not present

## 2022-11-27 DIAGNOSIS — I447 Left bundle-branch block, unspecified: Secondary | ICD-10-CM

## 2022-11-27 DIAGNOSIS — R0609 Other forms of dyspnea: Secondary | ICD-10-CM | POA: Diagnosis not present

## 2022-11-27 DIAGNOSIS — I493 Ventricular premature depolarization: Secondary | ICD-10-CM | POA: Diagnosis not present

## 2022-11-27 DIAGNOSIS — E7800 Pure hypercholesterolemia, unspecified: Secondary | ICD-10-CM

## 2022-11-27 DIAGNOSIS — I351 Nonrheumatic aortic (valve) insufficiency: Secondary | ICD-10-CM

## 2022-11-27 DIAGNOSIS — I10 Essential (primary) hypertension: Secondary | ICD-10-CM | POA: Diagnosis not present

## 2022-11-27 MED ORDER — VALSARTAN 320 MG PO TABS: 320.0000 mg | ORAL_TABLET | Freq: Every day | ORAL | 3 refills | Status: DC

## 2022-11-27 MED ORDER — SPIRONOLACTONE 25 MG PO TABS: 25.0000 mg | ORAL_TABLET | Freq: Every day | ORAL | 3 refills | Status: DC

## 2022-11-27 NOTE — Assessment & Plan Note (Signed)
Taking 10 mg atorvastatin daily.  Most recent lipids had an LDL of 69.  Goal is really less than 100.  Doing well.

## 2022-11-27 NOTE — Patient Instructions (Addendum)
Medication Instructions:    Stop taking Ramipril    Start taking Valsartan 320 mg one tablet a day   Start taking Spironolactone 25 mg - start with 1/2 tablet daily for a week then have lab work  take until result are gvien then will increase to a whole tablet daily    *If you need a refill on your cardiac medications before your next appointment, please call your pharmacy*   Lab Work: BMP in  1 week  after starting medication  If you have labs (blood work) drawn today and your tests are completely normal, you will receive your results only by: [place] (if you have MyChart) OR A paper copy in the mail If you have any lab test that is abnormal or we need to change your treatment, we will call you to review the results.   Testing/Procedures: Not  needed   Follow-Up: At [facility], you and your health needs are our priority.  As part of our continuing mission to provide you with exceptional heart care, we have created designated Provider Care Teams.  These Care Teams include your primary Cardiologist (physician) and Advanced Practice Providers (APPs -  Physician Assistants and Nurse Practitioners) who all work together to provide you with the care you need, when you need it.     Your next appointment:   6 to 7 month(s)  The format for your next appointment:   In Person  Provider:   Dr Ellyn Hack   Your physician recommends that you schedule a follow-up appointment in: CVRR - hypertension in 6 weeks     Other Instructions

## 2022-11-27 NOTE — Assessment & Plan Note (Signed)
Mostly just deconditioning related

## 2022-11-27 NOTE — Assessment & Plan Note (Signed)
Blood pressure still labile and difficult to manage.  I do not think she gave switching from ACE inhibitor or ARB long enough.  Can also add spironolactone as a diuretic.  Plan: Convert from ramipril to valsartan 320 mg daily. Add spironolactone-start 12.5 daily for just over a week, then reassess labs.  Pending lab results, will increase to full 25 mg

## 2022-11-27 NOTE — Assessment & Plan Note (Signed)
Not really heard on exam.  That makes sense there is only read as mild on echo.  As long as we keep her blood pressure control, I think we will probably fine.  Would not continue to recheck for 3 more years, unless murmur worsens.Lisa Cox

## 2022-11-27 NOTE — Assessment & Plan Note (Signed)
Will actually try to continue with the conversion from ramipril to valsartan and increasing to 320 mg daily.  Will also add spironolactone which will hopefully be titrated up after about a week once we see the renal function and potassium function.  Will then have her follow-up with CVRR hypertension clinic in 6 weeks for further management

## 2022-11-27 NOTE — Assessment & Plan Note (Signed)
Has not had OB with every EKG.  May be rate related.  No septal bounce on echo.

## 2022-11-27 NOTE — Progress Notes (Signed)
Primary Care / Referring Provider : Hoyt Koch, MD [place] Cardiologist: Glenetta Hew, MD Electrophysiologist: [name], MD  Clinic Note: Chief Complaint  Patient presents with   Follow-up    6 weeks.  Still having issues with BP; echo results   Hypertension   ===================================  ASSESSMENT/PLAN   Problem List Items Addressed This Visit       Cardiology Problems   Frequent unifocal PVCs (Chronic)    Status post ablation.  Doing much better.  Remains on high-dose atenolol      Relevant Medications   spironolactone (ALDACTONE) 25 MG tablet   valsartan (DIOVAN) 320 MG tablet   Hypercholesterolemia (Chronic)    Taking 10 mg atorvastatin daily.  Most recent lipids had an LDL of 69.  Goal is really less than 100.  Doing well.      Relevant Medications   spironolactone (ALDACTONE) 25 MG tablet   valsartan (DIOVAN) 320 MG tablet   Essential hypertension - Primary (Chronic)    Will actually try to continue with the conversion from ramipril to valsartan and increasing to 320 mg daily.  Will also add spironolactone which will hopefully be titrated up after about a week once we see the renal function and potassium function.  Will then have her follow-up with CVRR hypertension clinic in 6 weeks for further management      Relevant Medications   spironolactone (ALDACTONE) 25 MG tablet   valsartan (DIOVAN) 320 MG tablet   Other Relevant Orders   Basic metabolic panel   AMB Referral to [facility] Pharm-D   Complete left bundle branch block (LBBB) (Chronic)    Has not had OB with every EKG.  May be rate related.  No septal bounce on echo.      Relevant Medications   spironolactone (ALDACTONE) 25 MG tablet   valsartan (DIOVAN) 320 MG tablet   Benign hypertensive heart disease without heart failure (Chronic)    Blood pressure still labile and difficult to manage.  I do not think she gave switching from ACE inhibitor or ARB long  enough.  Can also add spironolactone as a diuretic.  Plan: Convert from ramipril to valsartan 320 mg daily. Add spironolactone-start 12.5 daily for just over a week, then reassess labs.  Pending lab results, will increase to full 25 mg      Relevant Medications   spironolactone (ALDACTONE) 25 MG tablet   valsartan (DIOVAN) 320 MG tablet   Other Relevant Orders   Basic metabolic panel   AMB Referral to [facility] Pharm-D   Aortic regurgitation (Chronic)    Not really heard on exam.  That makes sense there is only read as mild on echo.  As long as we keep her blood pressure control, I think we will probably fine.  Would not continue to recheck for 3 more years, unless murmur worsens..      Relevant Medications   spironolactone (ALDACTONE) 25 MG tablet   valsartan (DIOVAN) 320 MG tablet     Other   Medication management   Relevant Orders   Basic metabolic panel   AMB Referral to [facility]   DOE (dyspnea on exertion) - with throat tightness    Mostly just deconditioning related      ===================================  HPI:    Lisa Cox is a 77 y.o. female with a PMH below who presents today for 60-monthfollow-up.  Hypertension-difficulty control Frequent PVCs-status post RVOT ablation  I last saw [name]  in March 2023, 2 months after seeing Dr. Curt Bears with no complaints.  Just occasional skipped beats.  She was doing well.  Palpitations much better controlled.  Random off-and-on skipped beats.  Still occasional burning sensation across the chest but not associate with exertion.  Mild edema.  Usually goes down which puts her feet up.  No other PND or orthopnea.  Decreased stamina-tired going shopping.  She cold with cough and congestion.  Noted some joint and back pain.  Dizziness with standing too fast.  Mild insomnia and memory loss. => Supposedly was on 10 mg ramipril and 50 mg twice daily atenolol.  Recent Hospitalizations/ER visits/clinic visits:  She was seen twice this fall: 09/07/2022 Finis Bud, NP)-standard 59-monthfollow-up.  Doing well but still having burning in the chest.  Similar to previous presentations.  Noted some throat pain.  Similar today swelling.  No other questions or concerns.  BP was 135/61.  Had listed ramipril 10 mg 2 tabs daily. => Started Protonix 40 mg twice daily for 2 weeks and then reduce to daily.  Plan continue Pepcid nightly.  Stable swelling recommend compression stockings and leg elevation and Salty 6 diet. Seen 1 month later on 09/29/2022 by HAlmyra Deforest PA-C suggest the possibility of Coronary CTA versus stress PET to evaluate her burning sensation-she has TIG..  Not sure if she actually took the Protonix.  She was concerned about spikes in blood pressure-he recommended switching from ramipril to valsartan starting at 160 mg daily with a plan to potentially increase to 320.  Labs were ordered and 6-week follow-up scheduled.  He also ordered a 2D Echo to reassess aortic regurgitation  Reviewed  CV studies:    The following studies were reviewed today: (if available, images/films reviewed: From Epic Chart or Care Everywhere) TTE 11/20/2022: EF 55 to 60%.  No RWMA.  GR 1 DD.  No LA dilation.  Normal RV size and function.  Normal RAP.  AI-mild.  Normal MV.  Interval History:   MPOSIE LILLIBRIDGEreturns here today much stable.  She she still has a weird burning sensation across her chest but is also down sides of her waist hips.  Not really a major issue. As far as her blood pressure goes, she did not really notice a difference having switched to the losartan after a few days so she went back to ramipril.  She did not give it more than 2 to 3 days before switching back.  Your blood pressure range has been 145/70 up to 190/85.  She is not symptomatic from it.  No headaches or blurred vision.  Trivial edema.  She does not necessarily have orthopnea but sometimes she feels little short of breath at night waking up to  catch her breath.  She does not describe A sit up to catch her breath. She notes a few flutters here and there but no prolonged rapid irregular heartbeats palpitations.  For most part the palpitations have improved being back on her regular dose of atenolol.  No syncope or near syncope, TIA or amaurosis fugax.  No claudication.  CV Review of Symptoms (Summary): Cardiovascular ROS: positive for - chest pain and this is really a burning sensation which is her chronic symptom.  Not exertional.  She also has episodes of waking up in the melanite to catch her breath but does not sit up.  Just gasping. negative for - dyspnea on exertion, edema, irregular heartbeat, palpitations, paroxysmal nocturnal dyspnea, rapid heart rate, shortness of breath, or  lightheadedness, dizziness or wooziness, syncope/near syncope or TIA/amaurosis fugax, claudication  REVIEWED OF SYSTEMS   Review of Systems  Constitutional:  Negative for malaise/fatigue (Not very active).  HENT:  Negative for nosebleeds.   Eyes:  Negative for blurred vision.  Respiratory:  Negative for cough and shortness of breath.   Cardiovascular:  Negative for palpitations.  Gastrointestinal:  Negative for blood in stool, heartburn and melena.  Genitourinary:  Negative for hematuria.  Musculoskeletal:  Positive for joint pain.  Neurological:  Negative for dizziness and focal weakness.  Psychiatric/Behavioral:  Positive for memory loss. Negative for depression. The patient is nervous/anxious.   All other systems reviewed and are negative.   I have reviewed and (if needed) personally updated the patient's problem list, medications, allergies, past medical and surgical history, social and family history.   PAST MEDICAL HISTORY   Past Medical History:  Diagnosis Date   Abdominal pain, epigastric 11/12/2019   Acute cystitis with hematuria 10/29/2019   Anxiety    Bell's palsy 2009   Benign hypertensive heart disease without heart failure  07/11/2011   Breast cancer ([place])    Cancer ([place]) 11/2009   breast,lumpectomy right side followed by radiation   Depression    Dysuria 10/29/2019   Fatigue 05/26/2019   Frequent unifocal PVCs 07/2019   Frequent PVCs with some bigeminy noted on 48-hour monitor. No arrhythmias.==> s/p RVOT PVC ablation October 2020   GERD (gastroesophageal reflux disease)    Hypercholesterolemia 07/11/2011   Insomnia 04/10/2016   Moderate aortic regurgitation    Follow-up echocardiogram January 2024 shows mild AI   Nausea 11/12/2019   Personal history of radiation therapy    Routine general medical examination at a health care facility 07/12/2017   UTI symptoms 10/02/2019    PAST SURGICAL HISTORY   Past Surgical History:  Procedure Laterality Date   BREAST LUMPECTOMY Right 11/2009   right breast,followed by radiation therapy   CARDIAC EVENT MONITOR     Predominantly sinus rhythm with first-degree block. Minimum heart rate 63 bpm. Maximum heart rate 139 bpm. Average is 85 bpm. 17.5% PVCs.  V bigem & Trigem noted.     EYE SURGERY  1953   skin removal in eye   GXT  04/2017   Exercise tolerance test off of beta-blockers: Normal blood pressure response to exercise.  No EKG changes to suggest ischemic changes.  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  Seen at rest, exercise and recovery.   NM MYOVIEW LTD  08/2018   LOW RISK. No ischemia or infarction   PVC ABLATION N/A 08/19/2019   Procedure: PVC ABLATION;  Surgeon: Constance Haw, MD;  Location: [place];  Service: Cardiovascular;  Laterality: N/A;   TRANSTHORACIC ECHOCARDIOGRAM  07/11/2020   a) 09/10/2018: EF 55-60%.  No RWMA. Gr 2 DD. Mod AI.; b) 07/11/2020 normal LV size and function.  EF 55-60%.  Moderate asymmetric with basal septal hypertrophy.  Indeterminate diastolic pressures.  Small pericardial effusion, moderate AI.  Normal CVP.   TRANSTHORACIC ECHOCARDIOGRAM  10/2022   EF 55 to 60%.  No RWMA.  GR 1 DD.  No LA  dilation.  Normal RV size and function.  Normal RAP.  AI-mild.  Normal MV.--AI improved previously moderate.   TUBAL LIGATION  1978    MEDICATIONS/ALLERGIES   Current Meds  Medication Sig   aspirin 81 MG tablet Take 81 mg by mouth every evening.   atenolol (TENORMIN) 50 MG tablet TAKE 1 TABLET BY MOUTH TWICE  A DAY   atorvastatin (LIPITOR) 10 MG tablet Take 1 tablet (10 mg total) by mouth daily.   carboxymethylcellulose (REFRESH PLUS) 0.5 % SOLN Place 1 drop into both eyes 3 (three) times daily as needed (dry eye).    Cholecalciferol (VITAMIN D3) 50 MCG (2000 UT) TABS Take 2,000 Units by mouth daily.    dimenhyDRINATE (DRAMAMINE) 50 MG tablet Take 50 mg by mouth at bedtime. Must take with Lorazepam   famotidine (PEPCID) 20 MG tablet TAKE 1 TABLET (20 MG TOTAL) BY MOUTH AT BEDTIME AS NEEDED FOR HEARTBURN OR INDIGESTION.   ibuprofen (ADVIL) 200 MG tablet Take 600 mg by mouth every 6 (six) hours as needed for headache.   LORazepam (ATIVAN) 1 MG tablet TAKE 1 TABLET BY MOUTH EVERYDAY AT BEDTIME   Multiple Vitamins-Minerals (PRESERVISION AREDS 2) CAPS Take 1 capsule by mouth 2 (two) times daily.   sodium chloride (OCEAN) 0.65 % SOLN nasal spray Place 1 spray into both nostrils as needed for congestion.   spironolactone (ALDACTONE) 25 MG tablet Take 1 tablet (25 mg total) by mouth daily.   sulfamethoxazole-trimethoprim (BACTRIM DS) 800-160 MG tablet Take 1 tablet by mouth 2 (two) times daily.   valsartan (DIOVAN) 320 MG tablet Take 1 tablet (320 mg total) by mouth daily.   [DISCONTINUED] ramipril (ALTACE) 10 MG capsule Take 1 capsule (10 mg total) by mouth 2 (two) times daily.    Allergies  Allergen Reactions   Hctz [Hydrochlorothiazide] Other (See Comments)    Patient developed significant hyponatremia shortly after starting.  Would also include chlorthalidone   Amlodipine Other (See Comments)    Peripheral edema   Crestor [Rosuvastatin Calcium] Other (See Comments)    Leg cramps    Erythromycin Nausea Only   Macrobid [Nitrofurantoin Monohyd Macro] Nausea And Vomiting   Niacin And Related Itching   Restoril Other (See Comments)    Reaction-"nervous"   Zolpidem Tartrate     Reaction-"nervous"    SOCIAL HISTORY/FAMILY HISTORY   Reviewed in Epic:  Pertinent findings:  Social History   Tobacco Use   Smoking status: Never   Smokeless tobacco: Never  Substance Use Topics   Alcohol use: No   Drug use: No   Social History   Social History Narrative   Not on file    OBJCTIVE -PE, EKG, labs   Wt Readings from Last 3 Encounters:  11/27/22 156 lb 12.8 oz (71.1 kg)  10/19/22 157 lb (71.2 kg)  09/07/22 156 lb 6.4 oz (70.9 kg)    Physical Exam: BP (!) 162/72   Pulse 71   Ht '5\' 5"'$  (1.651 m)   Wt 156 lb 12.8 oz (71.1 kg)   SpO2 98%   BMI 26.09 kg/m  Physical Exam Vitals reviewed.  Constitutional:      General: She is not in acute distress.    Appearance: Normal appearance. She is normal weight. She is not ill-appearing (Well-nourished, well-groomed.) or toxic-appearing.  HENT:     Head: Normocephalic and atraumatic.  Neck:     Vascular: No carotid bruit or JVD.  Cardiovascular:     Rate and Rhythm: Normal rate and regular rhythm. Occasional Extrasystoles are present.    Chest Wall: PMI is not displaced.     Pulses: Normal pulses.     Heart sounds: Normal heart sounds, S1 normal and S2 normal. No murmur heard.    No friction rub. No gallop.  Pulmonary:     Effort: Pulmonary effort is normal. No respiratory distress.  Breath sounds: Normal breath sounds. No wheezing, rhonchi or rales.  Chest:     Chest wall: Tenderness present.  Abdominal:     General: Abdomen is flat. Bowel sounds are normal. There is no distension.     Palpations: Abdomen is soft.  Musculoskeletal:        General: No swelling.     Cervical back: Normal range of motion and neck supple.  Skin:    General: Skin is warm and dry.  Neurological:     General: No focal deficit  present.     Mental Status: She is alert and oriented to person, place, and time.  Psychiatric:        Mood and Affect: Mood normal.        Behavior: Behavior normal.        Thought Content: Thought content normal.        Judgment: Judgment normal.      Adult ECG Report Not checked  Recent Labs: Reviewed Lab Results  Component Value Date   CHOL 147 05/22/2022   HDL 39.60 05/22/2022   LDLCALC 69 05/22/2022   TRIG 192.0 (H) 05/22/2022   CHOLHDL 4 05/22/2022   Lab Results  Component Value Date   CREATININE 1.08 (H) 11/08/2022   BUN 14 11/08/2022   NA 140 11/08/2022   K 4.1 11/08/2022   CL 100 11/08/2022   CO2 29 11/08/2022      Latest Ref Rng & Units 05/22/2022   11:38 AM 06/20/2021   11:12 AM 11/16/2019    3:05 PM  CBC  WBC 4.0 - 10.5 K/uL 8.1  8.4  8.2   Hemoglobin 12.0 - 15.0 g/dL 13.0  13.0  12.8   Hematocrit 36.0 - 46.0 % 38.8  38.4  38.0   Platelets 150.0 - 400.0 K/uL 175.0  184.0  189.0     Lab Results  Component Value Date   HGBA1C 5.7 05/22/2022   Lab Results  Component Value Date   TSH 5.11 05/22/2022    ================================================== I spent a total of 23 minutes with the patient spent in direct patient consultation.  Additional time spent with chart review  / charting (studies, outside notes, etc): 23 min Total Time: 46 min  Current medicines are reviewed at length with the patient today.  (+/- concerns) N/A  Notice: This dictation was prepared with Dragon dictation along with smart phrase technology. Any transcriptional errors that result from this process are unintentional and may not be corrected upon review.  Studies Ordered:   Orders Placed This Encounter  Procedures   Basic metabolic panel   AMB Referral to [facility]   Meds ordered this encounter  Medications   spironolactone (ALDACTONE) 25 MG tablet    Sig: Take 1 tablet (25 mg total) by mouth daily.    Dispense:  90 tablet    Refill:  3   valsartan  (DIOVAN) 320 MG tablet    Sig: Take 1 tablet (320 mg total) by mouth daily.    Dispense:  90 tablet    Refill:  3    Patient Instructions / Medication Changes & Studies & Tests Ordered   Patient Instructions  Medication Instructions:    Stop taking Ramipril    Start taking Valsartan 320 mg one tablet a day   Start taking Spironolactone 25 mg - start with 1/2 tablet daily for a week then have lab work  take until result are gvien then will increase to a whole tablet daily    *  If you need a refill on your cardiac medications before your next appointment, please call your pharmacy*   Lab Work: BMP in  1 week  after starting medication  If you have labs (blood work) drawn today and your tests are completely normal, you will receive your results only by: [place] (if you have MyChart) OR A paper copy in the mail If you have any lab test that is abnormal or we need to change your treatment, we will call you to review the results.   Testing/Procedures: Not  needed   Follow-Up: At [facility], you and your health needs are our priority.  As part of our continuing mission to provide you with exceptional heart care, we have created designated Provider Care Teams.  These Care Teams include your primary Cardiologist (physician) and Advanced Practice Providers (APPs -  Physician Assistants and Nurse Practitioners) who all work together to provide you with the care you need, when you need it.     Your next appointment:   6 to 7 month(s)  The format for your next appointment:   In Person  Provider:   Dr Ellyn Hack   Your physician recommends that you schedule a follow-up appointment in: CVRR - hypertension in 6 weeks     Other Instructions      Leonie Man, MD, MS Glenetta Hew, M.D., M.S. Interventional Cardiologist  [place]  Pager # [phone] Phone # [phone] [address]. [place], [place] [address]   Thank you for  choosing [facility] [place]!!

## 2022-11-27 NOTE — Assessment & Plan Note (Signed)
Status post ablation.  Doing much better.  Remains on high-dose atenolol

## 2022-11-28 ENCOUNTER — Ambulatory Visit: Payer: PPO | Admitting: Student

## 2022-12-03 ENCOUNTER — Telehealth: Payer: Self-pay | Admitting: Cardiology

## 2022-12-03 NOTE — Telephone Encounter (Signed)
Pt c/o medication issue:  1. Name of Medication: valsartan (DIOVAN) 320 MG tablet  spironolactone (ALDACTONE) 25 MG tablet    2. How are you currently taking this medication (dosage and times per day)? Take 1 tablet (320 mg total) by mouth daily.            Take 1 tablet (25 mg total) by mouth daily.   3. Are you having a reaction (difficulty breathing--STAT)?   4. What is your medication issue? Patient called to say she had started to take these medication last week, but it was making her head tight and raising her blood pressure. So she stop taking the valsartan 358m, she said she started it on Wednesday and took it until Friday.   She she went back to taking her ramipril 160mand spironolactone 2554mand that helped bring her blood pressure back down.  She said she can't cut the spironolactone 43m57m half so she was talking a whole pill.

## 2022-12-04 NOTE — Telephone Encounter (Signed)
In that case, stay on the marrow pill and stay on the full dose of spironolactone.  Glenetta Hew, MD

## 2022-12-05 DIAGNOSIS — I119 Hypertensive heart disease without heart failure: Secondary | ICD-10-CM | POA: Diagnosis not present

## 2022-12-05 DIAGNOSIS — Z79899 Other long term (current) drug therapy: Secondary | ICD-10-CM | POA: Diagnosis not present

## 2022-12-05 DIAGNOSIS — I10 Essential (primary) hypertension: Secondary | ICD-10-CM | POA: Diagnosis not present

## 2022-12-05 LAB — BASIC METABOLIC PANEL
BUN/Creatinine Ratio: 13 (ref 12–28)
BUN: 14 mg/dL (ref 8–27)
CO2: 27 mmol/L (ref 20–29)
Calcium: 9.5 mg/dL (ref 8.7–10.3)
Chloride: 99 mmol/L (ref 96–106)
Creatinine, Ser: 1.09 mg/dL — ABNORMAL HIGH (ref 0.57–1.00)
Glucose: 97 mg/dL (ref 70–99)
Potassium: 4.4 mmol/L (ref 3.5–5.2)
Sodium: 138 mmol/L (ref 134–144)
eGFR: 52 mL/min/{1.73_m2} — ABNORMAL LOW (ref >59)

## 2022-12-05 NOTE — Telephone Encounter (Signed)
Spoke with patient of Dr Ellyn Hack. She went back to ramipril 53m and spironolactone 272mdaily. She reports she is due to come in for labs today. She has noticed cramping in her calves and cramping in her dominant hand. This is new for her. She feels well hydrated.   [id number] Her SBP went up to 190 while on valsartan 32022m She is coming in for BMET around 11am. Unsure if any other labs should be added on. Routed to MD/RN

## 2022-12-06 NOTE — Telephone Encounter (Signed)
Stable on ramipril and spironolactone.  Chemistry panel looks fine.

## 2022-12-07 NOTE — Telephone Encounter (Signed)
Lab results sent to MyChart by MD on 12/06/22 and patient viewed

## 2022-12-11 ENCOUNTER — Telehealth: Payer: Self-pay

## 2022-12-11 NOTE — Telephone Encounter (Signed)
Called patient to schedule Medicare Annual Wellness Visit (AWV). No voicemail available to leave a message.  Last date of AWV: 12/26/21  Please schedule an appointment at any time with [facility].    Thank you ,  P.J. Quentin Cornwall, CMA (AAMA)

## 2022-12-15 ENCOUNTER — Other Ambulatory Visit: Payer: Self-pay | Admitting: Cardiology

## 2022-12-27 ENCOUNTER — Telehealth: Payer: Self-pay | Admitting: Internal Medicine

## 2022-12-27 ENCOUNTER — Telehealth (INDEPENDENT_AMBULATORY_CARE_PROVIDER_SITE_OTHER): Payer: PPO | Admitting: Nurse Practitioner

## 2022-12-27 VITALS — Temp 100.0°F | Ht 65.0 in

## 2022-12-27 DIAGNOSIS — U071 COVID-19: Secondary | ICD-10-CM | POA: Diagnosis not present

## 2022-12-27 MED ORDER — NIRMATRELVIR/RITONAVIR (PAXLOVID) TABLET (RENAL DOSING): 2.0000 | ORAL_TABLET | Freq: Two times a day (BID) | ORAL | 0 refills | Status: AC

## 2022-12-27 NOTE — Assessment & Plan Note (Signed)
Acute, treat with renal dosing of Paxlovid.  Patient told to hold atorvastatin while taking Paxlovid and to cut her lorazepam in half while taking Paxlovid.  Educated to proceed to the emergency department if she experiences significant shortness of breath, high fever, worsening symptoms in general.  Patient reports understanding.  Encouraged her to continue taking Tylenol and ibuprofen as needed for fever.  We also discussed current isolation and quarantine guidelines.

## 2022-12-27 NOTE — Progress Notes (Signed)
   Established Patient Office Visit  An audio/visual tele-health visit was completed today for this patient. I connected with  Lisa Cox on 12/27/22 utilizing audio/visual technology and verified that I am speaking with the correct person using two identifiers. The patient was located at their home, and I was located at the office of [place] at [facility] during the encounter. I discussed the limitations of evaluation and management by telemedicine. The patient expressed understanding and agreed to proceed.    **There was a malfunction with the microphone.  So call was converted to audio only.  Unfortunately less than 50% of the call used video.   Subjective   Patient ID: Lisa Cox, female    DOB: 1945/12/14  Age: 77 y.o. MRN: [id number]  Chief Complaint  Patient presents with   Covid Positive    Had been coughing, having chills, headache, nausea  Been taking Ibuprofen and tylenol, it been going on sense Tuesday     Symptom onset 2 days ago. Having fever, chills, congestion, headache, cough, nausea, malaise. No shortness of breath, vomiting, diarrhea. Taking ibuprofen and tylenol as needed. Tested for covid at home it was positive today and yesterday.     Review of Systems  Constitutional:  Positive for chills, fever and malaise/fatigue.  Respiratory:  Positive for cough. Negative for shortness of breath.   Cardiovascular:  Negative for chest pain.  Gastrointestinal:  Positive for nausea. Negative for diarrhea and vomiting.  Neurological:  Positive for headaches.      Objective:     Temp 100 F (37.8 C)   Ht '5\' 5"'$  (1.651 m)   BMI 26.09 kg/m    Physical Exam Comprehensive physical exam not completed today as office visit was conducted remotely.  Patient a.  Patient was alert and oriented, and appeared to have appropriate judgment.   No results found for any visits on 12/27/22.    The 10-year ASCVD risk score (Arnett DK, et al., 2019) is:  43.8%    Assessment & Plan:   Problem List Items Addressed This Visit       Other   COVID - Primary   Relevant Medications   nirmatrelvir/ritonavir, renal dosing, (PAXLOVID) 10 x 150 MG & 10 x '100MG'$  TABS    Return if symptoms worsen or fail to improve.  Total time spent on the phone today was 7 minutes.   Ailene Ards, NP

## 2023-01-07 ENCOUNTER — Ambulatory Visit: Payer: PPO

## 2023-01-14 ENCOUNTER — Encounter: Payer: Self-pay | Admitting: Cardiology

## 2023-01-14 ENCOUNTER — Ambulatory Visit: Payer: PPO | Attending: Physician Assistant | Admitting: Cardiology

## 2023-01-14 VITALS — BP 150/76 | HR 79 | Ht 65.0 in | Wt 159.0 lb

## 2023-01-14 DIAGNOSIS — I10 Essential (primary) hypertension: Secondary | ICD-10-CM

## 2023-01-14 DIAGNOSIS — I493 Ventricular premature depolarization: Secondary | ICD-10-CM

## 2023-01-14 NOTE — Patient Instructions (Signed)
Medication Instructions:  Your physician recommends that you continue on your current medications as directed. Please refer to the Current Medication list given to you today. *If you need a refill on your cardiac medications before your next appointment, please call your pharmacy*   Follow-Up: At [facility], you and your health needs are our priority.  As part of our continuing mission to provide you with exceptional heart care, we have created designated Provider Care Teams.  These Care Teams include your primary Cardiologist (physician) and Advanced Practice Providers (APPs -  Physician Assistants and Nurse Practitioners) who all work together to provide you with the care you need, when you need it.   Your next appointment:   As needed  Provider:   Allegra Lai, MD

## 2023-01-14 NOTE — Progress Notes (Signed)
Electrophysiology Office Note   Date:  01/14/2023   ID:  Lisa Cox, Lisa Cox July 18, 1946, MRN [id number]  PCP:  Hoyt Koch, MD  Cardiologist:  Ellyn Hack Primary Electrophysiologist:  Clifton Safley Meredith Leeds, MD    No chief complaint on file.    History of Present Illness: Lisa Cox is a 77 y.o. female who is being seen today for the evaluation of PVCs at the request of Lisa Cox. Presenting today for electrophysiology evaluation.    History significant for hypertension, hyperlipidemia, PVCs.  She wore a cardiac monitor that showed an elevated burden.  She did not tolerate flecainide, diltiazem, mexiletine.  She has now status post ablation 08/19/2019.  Since ablation she has done well.  She has had no further PVCs.  Happy with her control.  Today, denies symptoms of palpitations, chest pain, shortness of breath, orthopnea, PND, lower extremity edema, claudication, dizziness, presyncope, syncope, bleeding, or neurologic sequela. The patient is tolerating medications without difficulties.      Past Medical History:  Diagnosis Date   Abdominal pain, epigastric 11/12/2019   Acute cystitis with hematuria 10/29/2019   Anxiety    Bell's palsy 2009   Benign hypertensive heart disease without heart failure 07/11/2011   Breast cancer ([place])    Cancer ([place]) 11/2009   breast,lumpectomy right side followed by radiation   Depression    Dysuria 10/29/2019   Fatigue 05/26/2019   Frequent unifocal PVCs 07/2019   Frequent PVCs with some bigeminy noted on 48-hour monitor. No arrhythmias.==> s/p RVOT PVC ablation October 2020   GERD (gastroesophageal reflux disease)    Hypercholesterolemia 07/11/2011   Insomnia 04/10/2016   Moderate aortic regurgitation    Follow-up echocardiogram January 2024 shows mild AI   Nausea 11/12/2019   Personal history of radiation therapy    Routine general medical examination at a health care facility 07/12/2017   UTI symptoms 10/02/2019   Past  Surgical History:  Procedure Laterality Date   BREAST LUMPECTOMY Right 11/2009   right breast,followed by radiation therapy   CARDIAC EVENT MONITOR     Predominantly sinus rhythm with first-degree block. Minimum heart rate 63 bpm. Maximum heart rate 139 bpm. Average is 85 bpm. 17.5% PVCs.  V bigem & Trigem noted.     EYE SURGERY  1953   skin removal in eye   GXT  04/2017   Exercise tolerance test off of beta-blockers: Normal blood pressure response to exercise.  No EKG changes to suggest ischemic changes.  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  Seen at rest, exercise and recovery.   NM MYOVIEW LTD  08/2018   LOW RISK. No ischemia or infarction   PVC ABLATION N/A 08/19/2019   Procedure: PVC ABLATION;  Surgeon: Constance Haw, MD;  Location: [facility];  Service: Cardiovascular;  Laterality: N/A;   TRANSTHORACIC ECHOCARDIOGRAM  07/11/2020   a) 09/10/2018: EF 55-60%.  No RWMA. Gr 2 DD. Mod AI.; b) 07/11/2020 normal LV size and function.  EF 55-60%.  Moderate asymmetric with basal septal hypertrophy.  Indeterminate diastolic pressures.  Small pericardial effusion, moderate AI.  Normal CVP.   TRANSTHORACIC ECHOCARDIOGRAM  10/2022   EF 55 to 60%.  No RWMA.  GR 1 DD.  No LA dilation.  Normal RV size and function.  Normal RAP.  AI-mild.  Normal MV.--AI improved previously moderate.   TUBAL LIGATION  1978     Current Outpatient Medications  Medication Sig Dispense Refill   aspirin 81 MG tablet  Take 81 mg by mouth every evening.     atenolol (TENORMIN) 50 MG tablet TAKE 1 TABLET BY MOUTH TWICE A DAY 180 tablet 3   atorvastatin (LIPITOR) 10 MG tablet Take 1 tablet (10 mg total) by mouth daily. 90 tablet 3   atorvastatin (LIPITOR) 20 MG tablet TAKE 1 TABLET BY MOUTH EVERY DAY 90 tablet 3   carboxymethylcellulose (REFRESH PLUS) 0.5 % SOLN Place 1 drop into both eyes 3 (three) times daily as needed (dry eye).      Cholecalciferol (VITAMIN D3) 50 MCG (2000 UT) TABS Take 2,000  Units by mouth daily.      dimenhyDRINATE (DRAMAMINE) 50 MG tablet Take 50 mg by mouth at bedtime. Must take with Lorazepam     famotidine (PEPCID) 20 MG tablet TAKE 1 TABLET (20 MG TOTAL) BY MOUTH AT BEDTIME AS NEEDED FOR HEARTBURN OR INDIGESTION. 90 tablet 3   ibuprofen (ADVIL) 200 MG tablet Take 600 mg by mouth every 6 (six) hours as needed for headache.     LORazepam (ATIVAN) 1 MG tablet TAKE 1 TABLET BY MOUTH EVERYDAY AT BEDTIME 90 tablet 1   Multiple Vitamins-Minerals (PRESERVISION AREDS 2) CAPS Take 1 capsule by mouth 2 (two) times daily.     sodium chloride (OCEAN) 0.65 % SOLN nasal spray Place 1 spray into both nostrils as needed for congestion.     spironolactone (ALDACTONE) 25 MG tablet Take 1 tablet (25 mg total) by mouth daily. 90 tablet 3   valsartan (DIOVAN) 320 MG tablet Take 1 tablet (320 mg total) by mouth daily. 90 tablet 3   No current facility-administered medications for this visit.    Allergies:   Hctz [hydrochlorothiazide], Amlodipine, Crestor [rosuvastatin calcium], Erythromycin, Macrobid [nitrofurantoin monohyd macro], Niacin and related, Restoril, and Zolpidem tartrate   Social History:  The patient  reports that she has never smoked. She has never used smokeless tobacco. She reports that she does not drink alcohol and does not use drugs.   Family History:  The patient's family history includes Breast cancer (age of onset: 49) in her paternal aunt; Heart attack in her father; Heart disease in her father; Hypertension in her brother and sister; Pancreatic cancer in her father; Stroke in her maternal aunt.   ROS:  Please see the history of present illness.   Otherwise, review of systems is positive for none.   All other systems are reviewed and negative.   PHYSICAL EXAM: VS:  BP (!) 150/76   Pulse 79   Ht 5\' 5"  (1.651 m)   Wt 159 lb (72.1 kg)   SpO2 96%   BMI 26.46 kg/m  , BMI Body mass index is 26.46 kg/m. GEN: Well nourished, well developed, in no acute  distress  HEENT: normal  Neck: no JVD, carotid bruits, or masses Cardiac: RRR; no murmurs, rubs, or gallops,no edema  Respiratory:  clear to auscultation bilaterally, normal work of breathing GI: soft, nontender, nondistended, + BS MS: no deformity or atrophy  Skin: warm and dry Neuro:  Strength and sensation are intact Psych: euthymic mood, full affect  EKG:  EKG is ordered today. Personal review of the ekg ordered shows sinus rhythm   Recent Labs: 05/22/2022: ALT 16; Hemoglobin 13.0; Platelets 175.0; TSH 5.11 12/05/2022: BUN 14; Creatinine, Ser 1.09; Potassium 4.4; Sodium 138    Lipid Panel     Component Value Date/Time   CHOL 147 05/22/2022 1138   CHOL 158 06/23/2020 0928   TRIG 192.0 (H) 05/22/2022 1138  HDL 39.60 05/22/2022 1138   HDL 45 06/23/2020 0928   CHOLHDL 4 05/22/2022 1138   VLDL 38.4 05/22/2022 1138   LDLCALC 69 05/22/2022 1138   LDLCALC 90 06/23/2020 0928     Wt Readings from Last 3 Encounters:  01/14/23 159 lb (72.1 kg)  11/27/22 156 lb 12.8 oz (71.1 kg)  10/19/22 157 lb (71.2 kg)      Other studies Reviewed: Additional studies/ records that were reviewed today include: TTE 09/02/18  Review of the above records today demonstrates:    - Left ventricle: The cavity size was normal. Systolic function was   normal. The estimated ejection fraction was in the range of 55%   to 60%. Wall motion was normal; there were no regional wall   motion abnormalities. Features are consistent with a pseudonormal   left ventricular filling pattern, with concomitant abnormal   relaxation and increased filling pressure (grade 2 diastolic   dysfunction). - Aortic valve: There was moderate regurgitation. Regurgitation   pressure half-time: 422 ms. - Mitral valve: There was trivial regurgitation. - Right ventricle: Systolic function was normal. - Atrial septum: No defect or patent foramen ovale was identified. - Tricuspid valve: There was trivial regurgitation. -  Pulmonic valve: There was no significant regurgitation.  SPECT 09/11/18 There was no ST segment deviation noted during stress. The study is normal. This is a low risk study with no evidence of perfusion abnormalities. This study was not gated due to frequent ventricular ectopy.  Monitor 08/26/18 - personally reviewed Predominantly sinus rhythm with first-degree block. Minimum heart rate 63 bpm. Maximum heart rate 139 bpm. Average is 85 bpm. Very rare PACs noted. (<1%) Frequent isolated PVCs (17.5%); Ventricular trigeminy and bigeminy noted on diary. No ventricular tachycardia or other arrhythmias. No pauses or bradycardia.   Significant amount of ventricular ectopy noted, but no arrhythmia.  ASSESSMENT AND PLAN:  1.  PVCs: Did not tolerate flecainide and mexiletine.  Post ablation 08/19/2019.  No further PVCs.  Continue with current management.  2.  Hypertension: Elevated today.  Usually well-controlled at home.  Has been managed by primary cardiology.  3.  Left bundle branch block: Developed December 2020.  Left bundle branch block is since normalized.  Continue with current management.  She is no longer having PVCs, I Turki Tapanes see her back as needed.  Further plan per primary cardiology.  Current medicines are reviewed at length with the patient today.   The patient does not have concerns regarding her medicines.  The following changes were made today: none  Labs/ tests ordered today include:  Orders Placed This Encounter  Procedures   EKG 12-Lead    Disposition:   FU PRN Signed, Sarissa Dern Meredith Leeds, MD  01/14/2023 12:28 PM     [facility] [address] [place] [address] [phone] (office) [phone] (fax)

## 2023-01-28 ENCOUNTER — Telehealth: Payer: Self-pay | Admitting: Internal Medicine

## 2023-01-28 NOTE — Telephone Encounter (Signed)
Contacted Lisa Cox to schedule their annual wellness visit. Appointment made for 01/29/2023.  [facility] Care Guide [facility] AWV TEAM Direct Dial: [phone]

## 2023-02-07 ENCOUNTER — Ambulatory Visit: Payer: PPO

## 2023-03-26 ENCOUNTER — Other Ambulatory Visit: Payer: Self-pay | Admitting: Physician Assistant

## 2023-04-09 ENCOUNTER — Ambulatory Visit (INDEPENDENT_AMBULATORY_CARE_PROVIDER_SITE_OTHER): Payer: PPO | Admitting: Internal Medicine

## 2023-04-09 ENCOUNTER — Encounter: Payer: Self-pay | Admitting: Internal Medicine

## 2023-04-09 VITALS — BP 140/80 | HR 70 | Temp 98.1°F | Ht 65.0 in | Wt 157.0 lb

## 2023-04-09 DIAGNOSIS — R7301 Impaired fasting glucose: Secondary | ICD-10-CM | POA: Diagnosis not present

## 2023-04-09 DIAGNOSIS — I10 Essential (primary) hypertension: Secondary | ICD-10-CM

## 2023-04-09 DIAGNOSIS — Z0000 Encounter for general adult medical examination without abnormal findings: Secondary | ICD-10-CM | POA: Diagnosis not present

## 2023-04-09 DIAGNOSIS — Z853 Personal history of malignant neoplasm of breast: Secondary | ICD-10-CM

## 2023-04-09 DIAGNOSIS — K219 Gastro-esophageal reflux disease without esophagitis: Secondary | ICD-10-CM | POA: Diagnosis not present

## 2023-04-09 DIAGNOSIS — G4700 Insomnia, unspecified: Secondary | ICD-10-CM

## 2023-04-09 DIAGNOSIS — E7800 Pure hypercholesterolemia, unspecified: Secondary | ICD-10-CM

## 2023-04-09 LAB — COMPREHENSIVE METABOLIC PANEL
ALT: 17 U/L (ref 0–35)
AST: 17 U/L (ref 0–37)
Albumin: 4.1 g/dL (ref 3.5–5.2)
Alkaline Phosphatase: 69 U/L (ref 39–117)
BUN: 16 mg/dL (ref 6–23)
CO2: 29 mEq/L (ref 19–32)
Calcium: 9.2 mg/dL (ref 8.4–10.5)
Chloride: 98 mEq/L (ref 96–112)
Creatinine, Ser: 1.07 mg/dL (ref 0.40–1.20)
GFR: 49.48 mL/min — ABNORMAL LOW (ref >60.00)
Glucose, Bld: 97 mg/dL (ref 70–99)
Potassium: 4.4 mEq/L (ref 3.5–5.1)
Sodium: 133 mEq/L — ABNORMAL LOW (ref 135–145)
Total Bilirubin: 0.3 mg/dL (ref 0.2–1.2)
Total Protein: 7.1 g/dL (ref 6.0–8.3)

## 2023-04-09 LAB — LIPID PANEL
Cholesterol: 116 mg/dL (ref 0–200)
HDL: 32.6 mg/dL — ABNORMAL LOW (ref >39.00)
NonHDL: 83.3
Total CHOL/HDL Ratio: 4
Triglycerides: 297 mg/dL — ABNORMAL HIGH (ref 0.0–149.0)
VLDL: 59.4 mg/dL — ABNORMAL HIGH (ref 0.0–40.0)

## 2023-04-09 LAB — CBC
HCT: 38 % (ref 36.0–46.0)
Hemoglobin: 12.7 g/dL (ref 12.0–15.0)
MCHC: 33.5 g/dL (ref 30.0–36.0)
MCV: 91.9 fl (ref 78.0–100.0)
Platelets: 202 10*3/uL (ref 150.0–400.0)
RBC: 4.14 Mil/uL (ref 3.87–5.11)
RDW: 13.9 % (ref 11.5–15.5)
WBC: 8 10*3/uL (ref 4.0–10.5)

## 2023-04-09 LAB — LDL CHOLESTEROL, DIRECT: Direct LDL: 56 mg/dL

## 2023-04-09 LAB — HEMOGLOBIN A1C: Hgb A1c MFr Bld: 5.6 % (ref 4.6–6.5)

## 2023-04-09 MED ORDER — ATORVASTATIN CALCIUM 10 MG PO TABS: 10.0000 mg | ORAL_TABLET | Freq: Every day | ORAL | 3 refills | Status: DC

## 2023-04-09 MED ORDER — FAMOTIDINE 20 MG PO TABS: 20.0000 mg | ORAL_TABLET | Freq: Every evening | ORAL | 3 refills | Status: DC | PRN

## 2023-04-09 NOTE — Progress Notes (Unsigned)
Subjective:   Patient ID: Lisa Cox, female    DOB: 04/03/1946, 77 y.o.   MRN: [id number]  HPI Here for medicare wellness and physical, no new complaints. Please see A/P for status and treatment of chronic medical problems.   Diet: heart healthy Physical activity: sedentary Depression/mood screen: negative Hearing: intact to whispered voice Visual acuity: grossly normal with lens, performs annual eye exam  ADLs: capable Fall risk: none Home safety: good Cognitive evaluation: intact to orientation, naming, recall and repetition EOL planning: adv directives discussed  Flowsheet Row Office Visit from 04/09/2023 in [facility] [place]  PHQ-2 Total Score 0       Flowsheet Row Video Visit from 12/27/2022 in [facility]  PHQ-9 Total Score 2         11/25/2020   10:44 AM 12/26/2021    1:44 PM 05/22/2022   11:10 AM 12/27/2022    3:07 PM 04/09/2023    2:21 PM  Fall Risk  Falls in the past year? 0 0 0 0 0  Was there an injury with Fall? 0 0 0 0 0  Fall Risk Category Calculator 0 0 0 0 0  Fall Risk Category (Retired) Low Low Low    (RETIRED) Patient Fall Risk Level Low fall risk      Patient at Risk for Falls Due to    No Fall Risks   Fall risk Follow up    Falls evaluation completed Falls evaluation completed    I have personally reviewed and have noted 1. The patient's medical and social history - reviewed today no changes 2. Their use of alcohol, tobacco or illicit drugs 3. Their current medications and supplements 4. The patient's functional ability including ADL's, fall risks, home safety risks and hearing or visual impairment. 5. Diet and physical activities 6. Evidence for depression or mood disorders 7. Care team reviewed and updated 8.  The patient is not on an opioid pain medication.  Patient Care Team: Myrlene Broker, MD as PCP - General (Internal Medicine) Marykay Lex, MD as PCP - Cardiology  (Cardiology) Erlene Quan Vinnie Level, [facility] (Inactive) as Pharmacist (Pharmacist) Past Medical History:  Diagnosis Date   Abdominal pain, epigastric 11/12/2019   Acute cystitis with hematuria 10/29/2019   Anxiety    Bell's palsy 2009   Benign hypertensive heart disease without heart failure 07/11/2011   Breast cancer ([facility])    Cancer ([facility]) 11/2009   breast,lumpectomy right side followed by radiation   Depression    Dysuria 10/29/2019   Fatigue 05/26/2019   Frequent unifocal PVCs 07/2019   Frequent PVCs with some bigeminy noted on 48-hour monitor. No arrhythmias.==> s/p RVOT PVC ablation October 2020   GERD (gastroesophageal reflux disease)    Hypercholesterolemia 07/11/2011   Insomnia 04/10/2016   Moderate aortic regurgitation    Follow-up echocardiogram January 2024 shows mild AI   Nausea 11/12/2019   Personal history of radiation therapy    Routine general medical examination at a health care facility 07/12/2017   UTI symptoms 10/02/2019   Past Surgical History:  Procedure Laterality Date   BREAST LUMPECTOMY Right 11/2009   right breast,followed by radiation therapy   CARDIAC EVENT MONITOR     Predominantly sinus rhythm with first-degree block. Minimum heart rate 63 bpm. Maximum heart rate 139 bpm. Average is 85 bpm. 17.5% PVCs.  V bigem & Trigem noted.     EYE SURGERY  1953   skin removal  in eye   GXT  04/2017   Exercise tolerance test off of beta-blockers: Normal blood pressure response to exercise.  No EKG changes to suggest ischemic changes.  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  Seen at rest, exercise and recovery.   NM MYOVIEW LTD  08/2018   LOW RISK. No ischemia or infarction   PVC ABLATION N/A 08/19/2019   Procedure: PVC ABLATION;  Surgeon: Regan Lemming, MD;  Location: [facility];  Service: Cardiovascular;  Laterality: N/A;   TRANSTHORACIC ECHOCARDIOGRAM  07/11/2020   a) 09/10/2018: EF 55-60%.  No RWMA. Gr 2 DD. Mod AI.; b) 07/11/2020 normal LV  size and function.  EF 55-60%.  Moderate asymmetric with basal septal hypertrophy.  Indeterminate diastolic pressures.  Small pericardial effusion, moderate AI.  Normal CVP.   TRANSTHORACIC ECHOCARDIOGRAM  10/2022   EF 55 to 60%.  No RWMA.  GR 1 DD.  No LA dilation.  Normal RV size and function.  Normal RAP.  AI-mild.  Normal MV.--AI improved previously moderate.   TUBAL LIGATION  1978   Family History  Problem Relation Age of Onset   Heart disease Father    Heart attack Father    Pancreatic cancer Father    Hypertension Sister    Hypertension Brother    Stroke Maternal Aunt    Breast cancer Paternal Aunt 85       x 2   Review of Systems  Constitutional:  Positive for fatigue.  HENT: Negative.    Eyes: Negative.   Respiratory:  Negative for cough, chest tightness and shortness of breath.   Cardiovascular:  Negative for chest pain, palpitations and leg swelling.  Gastrointestinal:  Negative for abdominal distention, abdominal pain, constipation, diarrhea, nausea and vomiting.  Musculoskeletal: Negative.   Skin: Negative.   Neurological: Negative.   Psychiatric/Behavioral: Negative.      Objective:  Physical Exam Constitutional:      Appearance: She is well-developed.  HENT:     Head: Normocephalic and atraumatic.  Cardiovascular:     Rate and Rhythm: Normal rate and regular rhythm.     Heart sounds: Murmur heard.  Pulmonary:     Effort: Pulmonary effort is normal. No respiratory distress.     Breath sounds: Normal breath sounds. No wheezing or rales.  Abdominal:     General: Bowel sounds are normal. There is no distension.     Palpations: Abdomen is soft.     Tenderness: There is no abdominal tenderness. There is no rebound.  Musculoskeletal:     Cervical back: Normal range of motion.  Skin:    General: Skin is warm and dry.  Neurological:     Mental Status: She is alert and oriented to person, place, and time.     Coordination: Coordination normal.     Vitals:    04/09/23 1418 04/09/23 1420  BP: (!) 140/80 (!) 140/80  Pulse: 70   Temp: 98.1 F (36.7 C)   TempSrc: Oral   SpO2: 98%   Weight: 157 lb (71.2 kg)   Height: 5\' 5"  (1.651 m)     Assessment & Plan:

## 2023-04-09 NOTE — Patient Instructions (Addendum)
We will check the labs today. Think about getting the pneumonia 20 and the shingles vaccine.

## 2023-04-10 NOTE — Assessment & Plan Note (Signed)
Gets yearly mammogram and up to date.  ?

## 2023-04-10 NOTE — Assessment & Plan Note (Signed)
BP at goal on atenolol 50 mg BID and spironolactone 25 mg daily. Checking CMP and adjust as needed.

## 2023-04-10 NOTE — Assessment & Plan Note (Signed)
Flu shot yearly. Pneumonia declines. Shingrix declines. Tetanus declines. Colonoscopy aged out future. Mammogram yearly up to date due 2025, pap smear aged out and dexa complete. Counseled about sun safety and mole surveillance. Counseled about the dangers of distracted driving. Given 10 year screening recommendations.

## 2023-04-10 NOTE — Assessment & Plan Note (Signed)
Checking lipid panel and adjust atorvastatin 10 mg daily for goal LDL <100.

## 2023-04-10 NOTE — Assessment & Plan Note (Signed)
Uses lorazepam 1 mg at bedtime for sleep and will refill when due. She is aware of risk and benefits.

## 2023-04-10 NOTE — Assessment & Plan Note (Signed)
Takes pepcid and controlled will continue 20 mg at bedtime.

## 2023-04-12 ENCOUNTER — Telehealth: Payer: Self-pay | Admitting: Internal Medicine

## 2023-04-12 DIAGNOSIS — E7800 Pure hypercholesterolemia, unspecified: Secondary | ICD-10-CM

## 2023-04-12 NOTE — Telephone Encounter (Signed)
Patient called and is concerned that her triglycerides was 297.  She wants to know if there is anything she can do  to bring them down.  Please advise.

## 2023-04-12 NOTE — Telephone Encounter (Signed)
This number can be affected by eating food so we can recheck fasting if she wants.

## 2023-04-15 NOTE — Telephone Encounter (Signed)
Pt would like to recheck for this

## 2023-04-15 NOTE — Telephone Encounter (Signed)
Labs placed.

## 2023-04-15 NOTE — Addendum Note (Signed)
Addended by: Hillard Danker A on: 04/15/2023 03:49 PM   Modules accepted: Orders

## 2023-04-16 NOTE — Telephone Encounter (Signed)
Called pt and informed them that the labs have been placed and they have been scheduled

## 2023-04-17 ENCOUNTER — Other Ambulatory Visit (INDEPENDENT_AMBULATORY_CARE_PROVIDER_SITE_OTHER): Payer: PPO

## 2023-04-17 DIAGNOSIS — E7800 Pure hypercholesterolemia, unspecified: Secondary | ICD-10-CM | POA: Diagnosis not present

## 2023-04-17 LAB — LIPID PANEL
Cholesterol: 113 mg/dL (ref 0–200)
HDL: 35.8 mg/dL — ABNORMAL LOW (ref >39.00)
LDL Cholesterol: 55 mg/dL (ref 0–99)
NonHDL: 76.87
Total CHOL/HDL Ratio: 3
Triglycerides: 108 mg/dL (ref 0.0–149.0)
VLDL: 21.6 mg/dL (ref 0.0–40.0)

## 2023-04-22 ENCOUNTER — Other Ambulatory Visit: Payer: Self-pay | Admitting: Internal Medicine

## 2023-04-22 DIAGNOSIS — Z1231 Encounter for screening mammogram for malignant neoplasm of breast: Secondary | ICD-10-CM

## 2023-05-03 ENCOUNTER — Encounter: Payer: Self-pay | Admitting: Family Medicine

## 2023-05-03 ENCOUNTER — Telehealth: Payer: PPO | Admitting: Family Medicine

## 2023-05-03 DIAGNOSIS — S60861A Insect bite (nonvenomous) of right wrist, initial encounter: Secondary | ICD-10-CM

## 2023-05-03 DIAGNOSIS — W57XXXA Bitten or stung by nonvenomous insect and other nonvenomous arthropods, initial encounter: Secondary | ICD-10-CM

## 2023-05-03 DIAGNOSIS — L282 Other prurigo: Secondary | ICD-10-CM | POA: Diagnosis not present

## 2023-05-03 MED ORDER — METHYLPREDNISOLONE 4 MG PO TBPK: ORAL_TABLET | ORAL | 0 refills | Status: DC

## 2023-05-03 MED ORDER — TRIAMCINOLONE ACETONIDE 0.1 % EX CREA: 1.0000 | TOPICAL_CREAM | Freq: Two times a day (BID) | CUTANEOUS | 0 refills | Status: DC

## 2023-05-03 NOTE — Progress Notes (Signed)
MyChart Video Visit    Virtual Visit via Video Note    Patient location: Home. Patient and provider in visit Provider location: Office  I discussed the limitations of evaluation and management by telemedicine and the availability of in person appointments. The patient expressed understanding and agreed to proceed.  Visit Date: 05/03/2023  Today's healthcare provider: Hetty Blend, NP-C     Subjective:    Patient ID: Lisa Cox, female    DOB: 1946-03-21, 77 y.o.   MRN: [id number]  Chief Complaint  Patient presents with   Rash    Started on right hand but Left arm mostly worse and spread to legs. Has tried cortisone     HPI  C/o pruritic rash on her left arm, right wrist and left upper leg. Felt like something big bit her on her right wrist, ?spider but she did not see one. Yesterday she noticed 4 small, red, raised. No blisters or drainage.  Burning and itching.   She has been outside recently cutting her grass and clipping bushes. Possible insect bites or poison ivy.   Using hydrocortisone. Took Benadryl.   Requesting a Medrol dose pak.   Denies fever, chills, dizziness, headache, chest pain, palpitations, shortness of breath, coughing, abdominal pain, N/V/D.    Past Medical History:  Diagnosis Date   Abdominal pain, epigastric 11/12/2019   Acute cystitis with hematuria 10/29/2019   Anxiety    Bell's palsy 2009   Benign hypertensive heart disease without heart failure 07/11/2011   Breast cancer (HCC)    Cancer (HCC) 11/2009   breast,lumpectomy right side followed by radiation   Depression    Dysuria 10/29/2019   Fatigue 05/26/2019   Frequent unifocal PVCs 07/2019   Frequent PVCs with some bigeminy noted on 48-hour monitor. No arrhythmias.==> s/p RVOT PVC ablation October 2020   GERD (gastroesophageal reflux disease)    Hypercholesterolemia 07/11/2011   Insomnia 04/10/2016   Moderate aortic regurgitation    Follow-up echocardiogram January 2024  shows mild AI   Nausea 11/12/2019   Personal history of radiation therapy    Routine general medical examination at a health care facility 07/12/2017   UTI symptoms 10/02/2019    Past Surgical History:  Procedure Laterality Date   BREAST LUMPECTOMY Right 11/2009   right breast,followed by radiation therapy   CARDIAC EVENT MONITOR     Predominantly sinus rhythm with first-degree block. Minimum heart rate 63 bpm. Maximum heart rate 139 bpm. Average is 85 bpm. 17.5% PVCs.  V bigem & Trigem noted.     EYE SURGERY  1953   skin removal in eye   GXT  04/2017   Exercise tolerance test off of beta-blockers: Normal blood pressure response to exercise.  No EKG changes to suggest ischemic changes.  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  Seen at rest, exercise and recovery.   NM MYOVIEW LTD  08/2018   LOW RISK. No ischemia or infarction   PVC ABLATION N/A 08/19/2019   Procedure: PVC ABLATION;  Surgeon: Regan Lemming, MD;  Location: [facility];  Service: Cardiovascular;  Laterality: N/A;   TRANSTHORACIC ECHOCARDIOGRAM  07/11/2020   a) 09/10/2018: EF 55-60%.  No RWMA. Gr 2 DD. Mod AI.; b) 07/11/2020 normal LV size and function.  EF 55-60%.  Moderate asymmetric with basal septal hypertrophy.  Indeterminate diastolic pressures.  Small pericardial effusion, moderate AI.  Normal CVP.   TRANSTHORACIC ECHOCARDIOGRAM  10/2022   EF 55 to 60%.  No RWMA.  GR 1 DD.  No LA dilation.  Normal RV size and function.  Normal RAP.  AI-mild.  Normal MV.--AI improved previously moderate.   TUBAL LIGATION  1978    Family History  Problem Relation Age of Onset   Heart disease Father    Heart attack Father    Pancreatic cancer Father    Hypertension Sister    Hypertension Brother    Stroke Maternal Aunt    Breast cancer Paternal Aunt 21       x 2    Social History   Socioeconomic History   Marital status: Divorced    Spouse name: Not on file   Number of children: Not on file    Years of education: Not on file   Highest education level: Not on file  Occupational History   Not on file  Tobacco Use   Smoking status: Never   Smokeless tobacco: Never  Substance and Sexual Activity   Alcohol use: No   Drug use: No   Sexual activity: Not Currently  Other Topics Concern   Not on file  Social History Narrative   Not on file   Social Determinants of Health   Financial Resource Strain: Not on file  Food Insecurity: Not on file  Transportation Needs: Not on file  Physical Activity: Not on file  Stress: Not on file  Social Connections: Not on file  Intimate Partner Violence: Not on file    Outpatient Medications Prior to Visit  Medication Sig Dispense Refill   aspirin 81 MG tablet Take 81 mg by mouth every evening.     atenolol (TENORMIN) 50 MG tablet TAKE 1 TABLET BY MOUTH TWICE A DAY 180 tablet 3   atorvastatin (LIPITOR) 10 MG tablet Take 1 tablet (10 mg total) by mouth daily. 90 tablet 3   carboxymethylcellulose (REFRESH PLUS) 0.5 % SOLN Place 1 drop into both eyes 3 (three) times daily as needed (dry eye).      Cholecalciferol (VITAMIN D3) 50 MCG (2000 UT) TABS Take 2,000 Units by mouth daily.      dimenhyDRINATE (DRAMAMINE) 50 MG tablet Take 50 mg by mouth at bedtime. Must take with Lorazepam     famotidine (PEPCID) 20 MG tablet Take 1 tablet (20 mg total) by mouth at bedtime as needed for heartburn or indigestion. 90 tablet 3   LORazepam (ATIVAN) 1 MG tablet TAKE 1 TABLET BY MOUTH EVERYDAY AT BEDTIME 90 tablet 1   Multiple Vitamins-Minerals (PRESERVISION AREDS 2) CAPS Take 1 capsule by mouth 2 (two) times daily.     sodium chloride (OCEAN) 0.65 % SOLN nasal spray Place 1 spray into both nostrils as needed for congestion.     spironolactone (ALDACTONE) 25 MG tablet Take 1 tablet (25 mg total) by mouth daily. 90 tablet 3   No facility-administered medications prior to visit.    Allergies  Allergen Reactions   Hctz [Hydrochlorothiazide] Other (See  Comments)    Patient developed significant hyponatremia shortly after starting.  Would also include chlorthalidone   Amlodipine Other (See Comments)    Peripheral edema   Crestor [Rosuvastatin Calcium] Other (See Comments)    Leg cramps   Erythromycin Nausea Only   Macrobid [Nitrofurantoin Monohyd Macro] Nausea And Vomiting   Niacin And Related Itching   Restoril Other (See Comments)    Reaction-"nervous"   Zolpidem Tartrate     Reaction-"nervous"    ROS     Objective:    Physical Exam  There were no vitals  taken for this visit. Wt Readings from Last 3 Encounters:  04/09/23 157 lb (71.2 kg)  01/14/23 159 lb (72.1 kg)  11/27/22 156 lb 12.8 oz (71.1 kg)   Red spots on the right breast, left forearm and left upper leg.  No sign of significant edema or drainage.  No sign of secondary bacterial infection.     Assessment & Plan:   Problem List Items Addressed This Visit   None Visit Diagnoses     Pruritic rash    -  Primary   Relevant Medications   triamcinolone cream (KENALOG) 0.1 %   methylPREDNISolone (MEDROL DOSEPAK) 4 MG TBPK tablet   Insect bite of right wrist, initial encounter       Relevant Medications   triamcinolone cream (KENALOG) 0.1 %      She is not in any acute distress. Unknown insect bite versus poison ivy.  She has done well in the past with a Medrol Dosepak and request this today.  Medication prescribed and discussed staying well-hydrated and potential side effects.  I also prescribed triamcinolone cream to use for the next week.  Discussed cool compresses for itching.  She will follow-up if she notices any signs of infection or if not improving  I am having Alberta A. Counce start on triamcinolone cream and methylPREDNISolone. I am also having her maintain her aspirin, carboxymethylcellulose, dimenhyDRINATE, Vitamin D3, PreserVision AREDS 2, sodium chloride, LORazepam, spironolactone, atenolol, atorvastatin, and famotidine.  Meds ordered this  encounter  Medications   triamcinolone cream (KENALOG) 0.1 %    Sig: Apply 1 Application topically 2 (two) times daily.    Dispense:  30 g    Refill:  0    Order Specific Question:   Supervising Provider    Answer:   Hillard Danker A [[id number]]   methylPREDNISolone (MEDROL DOSEPAK) 4 MG TBPK tablet    Sig: 24 mg PO on day 1, then decr. by 4 mg/day x5 days    Dispense:  21 tablet    Refill:  0    Order Specific Question:   Supervising Provider    Answer:   Hillard Danker A [[id number]]    I discussed the assessment and treatment plan with the patient. The patient was provided an opportunity to ask questions and all were answered. The patient agreed with the plan and demonstrated an understanding of the instructions.   The patient was advised to call back or seek an in-person evaluation if the symptoms worsen or if the condition fails to improve as anticipated.     Hetty Blend, NP-C [facility] [phone] (phone) [phone] (fax)  [facility]

## 2023-05-07 DIAGNOSIS — H353131 Nonexudative age-related macular degeneration, bilateral, early dry stage: Secondary | ICD-10-CM | POA: Diagnosis not present

## 2023-05-07 DIAGNOSIS — Z961 Presence of intraocular lens: Secondary | ICD-10-CM | POA: Diagnosis not present

## 2023-05-07 DIAGNOSIS — H524 Presbyopia: Secondary | ICD-10-CM | POA: Diagnosis not present

## 2023-05-23 ENCOUNTER — Other Ambulatory Visit: Payer: Self-pay | Admitting: Internal Medicine

## 2023-06-07 ENCOUNTER — Ambulatory Visit
Admission: RE | Admit: 2023-06-07 | Discharge: 2023-06-07 | Disposition: A | Discharge status: Home / Self Care | Payer: PPO | Source: Ambulatory Visit | Attending: Internal Medicine | Admitting: Internal Medicine

## 2023-06-07 DIAGNOSIS — Z1231 Encounter for screening mammogram for malignant neoplasm of breast: Secondary | ICD-10-CM

## 2023-06-10 LAB — HM MAMMOGRAPHY

## 2023-06-11 ENCOUNTER — Encounter: Payer: Self-pay | Admitting: Internal Medicine

## 2023-06-28 ENCOUNTER — Ambulatory Visit: Payer: PPO | Attending: Cardiology | Admitting: Cardiology

## 2023-06-28 VITALS — BP 136/62 | HR 69 | Ht 65.5 in | Wt 162.0 lb

## 2023-06-28 DIAGNOSIS — I10 Essential (primary) hypertension: Secondary | ICD-10-CM

## 2023-06-28 DIAGNOSIS — R0609 Other forms of dyspnea: Secondary | ICD-10-CM

## 2023-06-28 DIAGNOSIS — I119 Hypertensive heart disease without heart failure: Secondary | ICD-10-CM | POA: Diagnosis not present

## 2023-06-28 DIAGNOSIS — I493 Ventricular premature depolarization: Secondary | ICD-10-CM | POA: Diagnosis not present

## 2023-06-28 DIAGNOSIS — I447 Left bundle-branch block, unspecified: Secondary | ICD-10-CM

## 2023-06-28 NOTE — Patient Instructions (Signed)
Medication Instructions:  No changes *If you need a refill on your cardiac medications before your next appointment, please call your pharmacy*   Lab Work: Not needed   Testing/Procedures:  Not needed Follow-Up: At [facility], you and your health needs are our priority.  As part of our continuing mission to provide you with exceptional heart care, we have created designated Provider Care Teams.  These Care Teams include your primary Cardiologist (physician) and Advanced Practice Providers (APPs -  Physician Assistants and Nurse Practitioners) who all work together to provide you with the care you need, when you need it.     Your next appointment:   12 month(s)  The format for your next appointment:   In Person  Provider:   David Harding, MD    

## 2023-06-28 NOTE — Progress Notes (Unsigned)
Cardiology Office Note:  .   Date:  06/29/2023  ID:  Lisa Cox, DOB [birth year], MRN [id number] PCP: Myrlene Broker, MD  [facility] Providers Cardiologist:  Bryan Lemma, MD     Chief Complaint  Patient presents with   Follow-up    6-7 Months.    History of Present Illness: Marland Kitchen     Lisa Cox is a pleasant 77 y.o. female  with a PMH notable for labile/difficult to control hypertension as well as frequent PVCs (s/p RVOT ablation October 2020 with brief interval of LBBB thought to have resolved), and intermittent LBBB who presents here for delayed 42-month follow-up at the request of Myrlene Broker, *.  Lisa Cox was last seen on November 27, 2022 still having issues with blood pressure.  Was noting intermittent episodes of a weird burning sensation across her chest that also would go down the sides of her waist and hips.  But nothing really bothering her too much.  Noted BP range between 145/70-190/85.  No headache or blurred vision.  Trivial edema.  Some dizziness but no real orthostasis.  Palpitations better being on regular dose of atenolol.  Noted some joint pain and memory loss.  Felt to have a rate related bundle branch Plan was to convert from ramipril to losartan and increase to 320 mg.  Added spironolactone with plans to titrate up from half tablet to full tablet.  Was in to see CVRR for BP management.  She was most most recently seen on January 14, 2023 by Dr. Elberta Fortis for EP consultation for PVCs.  Noted that she had not tolerated flecainide, diltiazem or mexiletine.  Underwent ablation in October 2020.  Done well since ablation with no further concerns or PVCs.  She did have a left bundle branch block that began in December 2020 that is since normalized.  Plan was for EP follow-up as needed.    Subjective  INTERVAL HISTORY Lisa NICHOL returns for routine follow-up overall doing pretty well.  She still has the occasional burning sensation in  her chest off and on that just simply comes and goes with no associated activity.  She also says that she occasionally will overdo it with overexertion and that may make her feel a bit worn out and tired and more distal than usual.  She may feel a little bit of tightness but with routine activity level she does not notice exertional dyspnea.  She has mild end of day swelling in the left> right leg.  Denies any PND, orthopnea or concerning edema.  No real sensation of palpitations just very rare flutters.  No rapid irregular heartbeats.  No syncope or near syncope.  No TIA or amaurosis fugax.  No claudication.  ROS:  Cardiovascular ROS: positive for - chest pain, dyspnea on exertion, edema, and chest pain is described as a burning sensation off and on randomly-chronic.  DOE is with overexertion and edema is rare L>R LE end of day swelling. negative for - irregular heartbeat, paroxysmal nocturnal dyspnea, rapid heart rate, shortness of breath, or lightheadedness, dizziness or wooziness, syncope/near syncope or TIA/amaurosis fugax, claudication   Review of Systems - Negative except slowing down little bit.  Mild joint pains.  Some mild memory loss.  Still has anxiety.     Objective   Studies Reviewed: Marland Kitchen   EKG Interpretation Date/Time:  Friday June 28 2023 09:44:15 EDT Ventricular Rate:  69 PR Interval:  212 QRS Duration:  142 QT Interval:  440 QTC Calculation: 471 R Axis:   -40  Text Interpretation: Sinus rhythm with 1st degree A-V block Left axis deviation Left bundle branch block When compared with ECG of 19-Aug-2019 11:26, Left bundle branch block has replaced Incomplete right bundle branch block Confirmed by Bryan Lemma ([id number]) on 06/29/2023 3:20:10 PM   TTE 11/20/2022: EF 55 to 60%.  No RWMA.  GR 1 DD.  No LA dilation.  Normal RV size and function.  Normal RAP.  AI-mild.  Normal MV.  EKG Interpretation Date/Time:  Friday June 28 2023 09:44:15 EDT Ventricular Rate:  69 PR  Interval:  212 QRS Duration:  142 QT Interval:  440 QTC Calculation: 471 R Axis:   -40  Text Interpretation: Sinus rhythm with 1st degree A-V block Left axis deviation Left bundle branch block When compared with ECG of 19-Aug-2019 11:26, Left bundle branch block has replaced Incomplete right bundle branch block Confirmed by Bryan Lemma ([id number]) on 06/29/2023 3:20:10 PM    Risk Assessment/Calculations:             Physical Exam:   VS:  BP 136/62 (BP Location: Left Arm, Patient Position: Sitting, Cuff Size: Normal)   Pulse 69   Ht 5' 5.5" (1.664 m)   Wt 162 lb (73.5 kg)   BMI 26.55 kg/m    Wt Readings from Last 3 Encounters:  06/28/23 162 lb (73.5 kg)  04/09/23 157 lb (71.2 kg)  01/14/23 159 lb (72.1 kg)    GEN: Well nourished, well developed in no acute distress; healthy-appearing NECK: No JVD; No carotid bruits CARDIAC: Normal S1, split S2; RRR, no murmurs, rubs, gallops RESPIRATORY:  Clear to auscultation without rales, wheezing or rhonchi ; nonlabored, good air movement. ABDOMEN: Soft, non-tender, non-distended EXTREMITIES:  No edema; No deformity      ASSESSMENT AND PLAN: .    Problem List Items Addressed This Visit       Cardiology Problems   Benign hypertensive heart disease without heart failure (Chronic)    She truly does not have any heart failure symptoms.  Trivial end of day swelling but no PND orthopnea.  Dyspnea with overexertion which is probably as much from diastolic dysfunction, but not really limiting her. She is not sure of the reason why but she did not like the ARB and went back to ramipril.  Plan: Continue current dose of atenolol 50 mg twice daily and ramipril 10 mg daily. Edema seems to be better controlled since she is on spironolactone 25 mg daily. Has not required loop diuretic.      Relevant Medications   ramipril (ALTACE) 10 MG capsule   Complete left bundle branch block (LBBB) (Chronic)    Again has left bundle branch block on her EKG  today but did not have it last time.  I do not know if it is related or not because there are sometimes the rates are in the 60s and she is in regular rhythm.  There is clearly some conduction system disorder.  Thankfully she is not necessarily noticing a thing untoward from it.  If she does remain in left bundle branch block then we would want to monitor her symptoms.      Relevant Medications   ramipril (ALTACE) 10 MG capsule   Essential hypertension - Primary (Chronic)    She has simply labile blood pressures but seems to be pretty stable now.  Will continue with her current medications as she has been intolerant of changing anything else.  She is  on a stable dose of atenolol, ramipril but did tolerate the spironolactone.  Will continue that as well.      Relevant Medications   ramipril (ALTACE) 10 MG capsule   Frequent unifocal PVCs (Chronic)    Doing much better after her ablation back in 2020.  Had some palpitations with adjusting her atenolol dose but now she is on 50 mg twice daily, stable.  Although suboptimal to use atenolol, this seems to be the best 1 for controlling her symptoms.      Relevant Medications   ramipril (ALTACE) 10 MG capsule     Other   DOE (dyspnea on exertion) - with throat tightness    I think this is improved.  She has not made any complaint of this recently.            Dispo: Return in about 1 year (around 06/27/2024) for 1 Yr Follow-up.  Total time spent: 22 min spent with patient + 11 min spent charting = 33 min    Signed, Marykay Lex, MD, MS Bryan Lemma, M.D., M.S. Interventional Cardiologist  [facility]  Pager # [phone] Phone # [phone] [address] [place], [place] [address]

## 2023-06-29 ENCOUNTER — Encounter: Payer: Self-pay | Admitting: Cardiology

## 2023-06-29 NOTE — Assessment & Plan Note (Signed)
Doing much better after her ablation back in 2020.  Had some palpitations with adjusting her atenolol dose but now she is on 50 mg twice daily, stable.  Although suboptimal to use atenolol, this seems to be the best 1 for controlling her symptoms.

## 2023-06-29 NOTE — Assessment & Plan Note (Signed)
She has simply labile blood pressures but seems to be pretty stable now.  Will continue with her current medications as she has been intolerant of changing anything else.  She is on a stable dose of atenolol, ramipril but did tolerate the spironolactone.  Will continue that as well.

## 2023-06-29 NOTE — Assessment & Plan Note (Signed)
Again has left bundle branch block on her EKG today but did not have it last time.  I do not know if it is related or not because there are sometimes the rates are in the 60s and she is in regular rhythm.  There is clearly some conduction system disorder.  Thankfully she is not necessarily noticing a thing untoward from it.  If she does remain in left bundle branch block then we would want to monitor her symptoms.

## 2023-06-29 NOTE — Assessment & Plan Note (Signed)
I think this is improved.  She has not made any complaint of this recently.

## 2023-06-29 NOTE — Assessment & Plan Note (Signed)
She truly does not have any heart failure symptoms.  Trivial end of day swelling but no PND orthopnea.  Dyspnea with overexertion which is probably as much from diastolic dysfunction, but not really limiting her. She is not sure of the reason why but she did not like the ARB and went back to ramipril.  Plan: Continue current dose of atenolol 50 mg twice daily and ramipril 10 mg daily. Edema seems to be better controlled since she is on spironolactone 25 mg daily. Has not required loop diuretic.

## 2023-07-12 ENCOUNTER — Encounter: Payer: Self-pay | Admitting: Pharmacist

## 2023-07-12 NOTE — Progress Notes (Signed)
Pharmacy Quality Measure Review  This patient is appearing on a report for being at risk of failing the adherence measure for hypertension (ACEi/ARB) medications this calendar year.   Medication: Valsartan 320 mg daily Last fill date: 11/27/22 for 90 day supply  Valsartan is showing as discontinued on 04/09/2023. No action needed at this time.    Arbutus Leas, PharmD, BCPS [facility] [phone]

## 2023-07-23 ENCOUNTER — Encounter: Payer: Self-pay | Admitting: Pharmacist

## 2023-07-23 NOTE — Progress Notes (Signed)
Pharmacy Quality Measure Review  This patient is appearing on a report for being at risk of failing the adherence measure for cholesterol (statin) medications this calendar year.   Medication: atorvastatin 20 mg daily Last fill date: 06/29/2023 for 90 day supply  Insurance report was not up to date. No action needed at this time.   Arbutus Leas, PharmD, BCPS [facility] [phone]

## 2023-08-08 DIAGNOSIS — Z6827 Body mass index (BMI) 27.0-27.9, adult: Secondary | ICD-10-CM | POA: Diagnosis not present

## 2023-08-08 DIAGNOSIS — Z01419 Encounter for gynecological examination (general) (routine) without abnormal findings: Secondary | ICD-10-CM | POA: Diagnosis not present

## 2023-08-28 DIAGNOSIS — R140 Abdominal distension (gaseous): Secondary | ICD-10-CM | POA: Diagnosis not present

## 2023-08-28 DIAGNOSIS — R102 Pelvic and perineal pain: Secondary | ICD-10-CM | POA: Diagnosis not present

## 2023-09-22 ENCOUNTER — Other Ambulatory Visit: Payer: Self-pay | Admitting: Cardiology

## 2023-11-25 ENCOUNTER — Other Ambulatory Visit: Payer: Self-pay | Admitting: Internal Medicine

## 2023-12-02 ENCOUNTER — Other Ambulatory Visit: Payer: Self-pay | Admitting: Cardiology

## 2023-12-18 ENCOUNTER — Telehealth: Payer: Self-pay | Admitting: Internal Medicine

## 2023-12-18 NOTE — Telephone Encounter (Signed)
 Copied from CRM [id number]. Topic: Appointments - Appointment Scheduling >> Dec 18, 2023  9:13 AM Louie Casa B wrote: Patient/patient representative is calling to schedule an appointment. Refer to attachments for appointment information.  Patient wants to know if she should schedule hepatitis b shot she would like to speak to a nurse about it please call pt back [phone]

## 2023-12-19 NOTE — Telephone Encounter (Signed)
**Note De-identified  [place] Obfuscation** Please advise 

## 2023-12-20 NOTE — Telephone Encounter (Signed)
 Patient decided to wait until her annual to discuss

## 2023-12-20 NOTE — Telephone Encounter (Signed)
 Happy if patient wants apt to discuss if hep b is needed or not. Is that what she is requested.

## 2023-12-28 ENCOUNTER — Other Ambulatory Visit: Payer: Self-pay | Admitting: Cardiology

## 2023-12-28 ENCOUNTER — Other Ambulatory Visit: Payer: Self-pay | Admitting: Physician Assistant

## 2023-12-28 DIAGNOSIS — R0609 Other forms of dyspnea: Secondary | ICD-10-CM

## 2023-12-28 DIAGNOSIS — I10 Essential (primary) hypertension: Secondary | ICD-10-CM

## 2023-12-28 DIAGNOSIS — I119 Hypertensive heart disease without heart failure: Secondary | ICD-10-CM

## 2023-12-30 MED ORDER — ATORVASTATIN CALCIUM 10 MG PO TABS: 10.0000 mg | ORAL_TABLET | Freq: Every day | ORAL | 1 refills | Status: AC

## 2024-01-15 ENCOUNTER — Other Ambulatory Visit: Payer: Self-pay

## 2024-01-15 NOTE — Progress Notes (Signed)
 This patient is appearing on a report for being at risk of failing the adherence measure for cholesterol (statin) and hypertension (ACEi/ARB) medications this calendar year.   Medication: Ramipril 10mg  twice daily Last fill date: 12/30/2023 for 90 day supply  Medication: Atorvastatin 10mg  daily  Last fill date: 12/30/2023 for 90 day supply   Discussed barriers to adherence, which included concern for hyperkalemia with ramipril  and Reviewed medication indication, dosing, and goals of therapy. Patient will contact PCP or cardiologist if continued reservations and questions regarding ramipril.   Verdene Rio, PharmD PGY1 Pharmacy Resident

## 2024-04-09 ENCOUNTER — Encounter: Admitting: Internal Medicine

## 2024-04-10 ENCOUNTER — Encounter: Admitting: Internal Medicine

## 2024-04-13 ENCOUNTER — Ambulatory Visit: Admitting: Internal Medicine

## 2024-04-13 ENCOUNTER — Encounter: Payer: Self-pay | Admitting: Internal Medicine

## 2024-04-13 VITALS — BP 120/64 | HR 69 | Temp 98.4°F | Ht 65.5 in | Wt 163.0 lb

## 2024-04-13 DIAGNOSIS — I10 Essential (primary) hypertension: Secondary | ICD-10-CM | POA: Diagnosis not present

## 2024-04-13 DIAGNOSIS — Z853 Personal history of malignant neoplasm of breast: Secondary | ICD-10-CM | POA: Diagnosis not present

## 2024-04-13 DIAGNOSIS — Z0000 Encounter for general adult medical examination without abnormal findings: Secondary | ICD-10-CM

## 2024-04-13 DIAGNOSIS — K219 Gastro-esophageal reflux disease without esophagitis: Secondary | ICD-10-CM | POA: Diagnosis not present

## 2024-04-13 DIAGNOSIS — G4700 Insomnia, unspecified: Secondary | ICD-10-CM

## 2024-04-13 DIAGNOSIS — I119 Hypertensive heart disease without heart failure: Secondary | ICD-10-CM

## 2024-04-13 DIAGNOSIS — R0609 Other forms of dyspnea: Secondary | ICD-10-CM

## 2024-04-13 DIAGNOSIS — E7800 Pure hypercholesterolemia, unspecified: Secondary | ICD-10-CM

## 2024-04-13 LAB — LIPID PANEL
Cholesterol: 123 mg/dL (ref 0–200)
HDL: 35.2 mg/dL — ABNORMAL LOW (ref >39.00)
LDL Cholesterol: 51 mg/dL (ref 0–99)
NonHDL: 87.61
Total CHOL/HDL Ratio: 3
Triglycerides: 185 mg/dL — ABNORMAL HIGH (ref 0.0–149.0)
VLDL: 37 mg/dL (ref 0.0–40.0)

## 2024-04-13 LAB — CBC
HCT: 37.2 % (ref 36.0–46.0)
Hemoglobin: 12.5 g/dL (ref 12.0–15.0)
MCHC: 33.6 g/dL (ref 30.0–36.0)
MCV: 92.4 fl (ref 78.0–100.0)
Platelets: 187 10*3/uL (ref 150.0–400.0)
RBC: 4.03 Mil/uL (ref 3.87–5.11)
RDW: 13.3 % (ref 11.5–15.5)
WBC: 8.5 10*3/uL (ref 4.0–10.5)

## 2024-04-13 LAB — HEMOGLOBIN A1C: Hgb A1c MFr Bld: 5.7 % (ref 4.6–6.5)

## 2024-04-13 MED ORDER — FAMOTIDINE 20 MG PO TABS: 20.0000 mg | ORAL_TABLET | Freq: Every evening | ORAL | 3 refills | Status: AC | PRN

## 2024-04-13 NOTE — Progress Notes (Unsigned)
 Subjective:   Patient ID: Lisa Cox, female    DOB: 05-Dec-1945, 78 y.o.   MRN: [id number]  HPI Here for medicare wellness and physical, no new complaints. Please see A/P for status and treatment of chronic medical problems.   Diet: heart healthy Physical activity: sedentary, walks daily <1 mile Depression/mood screen: negative Hearing: intact to whispered voice Visual acuity: grossly normal with lens, performs annual eye exam  ADLs: capable Fall risk: none Home safety: good Cognitive evaluation: intact to orientation, naming, recall and repetition EOL planning: adv directives discussed  Flowsheet Row Office Visit from 04/13/2024 in [facility] [place]  PHQ-2 Total Score 0    Flowsheet Row Office Visit from 04/13/2024 in [facility]  PHQ-9 Total Score 0      12/26/2021    1:44 PM 05/22/2022   11:10 AM 12/27/2022    3:07 PM 04/09/2023    2:21 PM 04/13/2024    2:33 PM  Fall Risk  Falls in the past year? 0 0 0 0 0  Was there an injury with Fall? 0 0 0 0 0  Fall Risk Category Calculator 0 0 0 0 0  Fall Risk Category (Retired) Low  Low      Patient at Risk for Falls Due to   No Fall Risks    Fall risk Follow up   Falls evaluation completed Falls evaluation completed Falls evaluation completed     Data saved with a previous flowsheet row definition    I have personally reviewed and have noted 1. The patient's medical and social history - reviewed today no changes 2. Their use of alcohol, tobacco or illicit drugs 3. Their current medications and supplements 4. The patient's functional ability including ADL's, fall risks, home safety risks and hearing or visual impairment. 5. Diet and physical activities 6. Evidence for depression or mood disorders 7. Care team reviewed and updated 8.  The patient is not on an opioid pain medication.  Patient Care Team: Lisa Almarie DELENA, MD as PCP - General (Internal  Medicine) Anner Alm ORN, MD as PCP - Cardiology (Cardiology) Rozella Toribio BROCKS, [facility] (Inactive) as Pharmacist (Pharmacist) Past Medical History:  Diagnosis Date   Abdominal pain, epigastric 11/12/2019   Acute cystitis with hematuria 10/29/2019   Anxiety    Bell's palsy 2009   Benign hypertensive heart disease without heart failure 07/11/2011   Breast cancer (HCC)    Cancer (HCC) 11/2009   breast,lumpectomy right side followed by radiation   Depression    Dysuria 10/29/2019   Fatigue 05/26/2019   Frequent unifocal PVCs 07/2019   Frequent PVCs with some bigeminy noted on 48-hour monitor. No arrhythmias.==> s/p RVOT PVC ablation October 2020   GERD (gastroesophageal reflux disease)    Hypercholesterolemia 07/11/2011   Insomnia 04/10/2016   Moderate aortic regurgitation    Follow-up echocardiogram January 2024 shows mild AI   Nausea 11/12/2019   Personal history of radiation therapy    Routine general medical examination at a health care facility 07/12/2017   UTI symptoms 10/02/2019   Past Surgical History:  Procedure Laterality Date   BREAST LUMPECTOMY Right 11/2009   right breast,followed by radiation therapy   CARDIAC EVENT MONITOR     Predominantly sinus rhythm with first-degree block. Minimum heart rate 63 bpm. Maximum heart rate 139 bpm. Average is 85 bpm. 17.5% PVCs.  V bigem & Trigem noted.     EYE SURGERY  1953  skin removal in eye   GXT  04/2017   Exercise tolerance test off of beta-blockers: Normal blood pressure response to exercise.  No EKG changes to suggest ischemic changes.  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  Seen at rest, exercise and recovery.   NM MYOVIEW  LTD  08/2018   LOW RISK. No ischemia or infarction   PVC ABLATION N/A 08/19/2019   Procedure: PVC ABLATION;  Surgeon: Inocencio Soyla Lunger, MD;  Location: [facility] INVASIVE CV LAB;  Service: Cardiovascular;  Laterality: N/A;   TRANSTHORACIC ECHOCARDIOGRAM  07/11/2020   a) 09/10/2018: EF 55-60%.   No RWMA. Gr 2 DD. Mod AI.; b) 07/11/2020 normal LV size and function.  EF 55-60%.  Moderate asymmetric with basal septal hypertrophy.  Indeterminate diastolic pressures.  Small pericardial effusion, moderate AI.  Normal CVP.   TRANSTHORACIC ECHOCARDIOGRAM  10/2022   EF 55 to 60%.  No RWMA.  GR 1 DD.  No LA dilation.  Normal RV size and function.  Normal RAP.  AI-mild.  Normal MV.--AI improved previously moderate.   TUBAL LIGATION  1978   Family History  Problem Relation Age of Onset   Heart disease Father    Heart attack Father    Pancreatic cancer Father    Hypertension Sister    Hypertension Brother    Stroke Maternal Aunt    Breast cancer Paternal Aunt 85       x 2   Review of Systems  Constitutional: Negative.   HENT: Negative.    Eyes: Negative.   Respiratory:  Negative for cough, chest tightness and shortness of breath.   Cardiovascular:  Negative for chest pain, palpitations and leg swelling.  Gastrointestinal:  Negative for abdominal distention, abdominal pain, constipation, diarrhea, nausea and vomiting.  Musculoskeletal: Negative.   Skin: Negative.   Neurological: Negative.   Psychiatric/Behavioral: Negative.      Objective:  Physical Exam Constitutional:      Appearance: She is well-developed.  HENT:     Head: Normocephalic and atraumatic.   Cardiovascular:     Rate and Rhythm: Normal rate and regular rhythm.  Pulmonary:     Effort: Pulmonary effort is normal. No respiratory distress.     Breath sounds: Normal breath sounds. No wheezing or rales.  Abdominal:     General: Bowel sounds are normal. There is no distension.     Palpations: Abdomen is soft.     Tenderness: There is no abdominal tenderness. There is no rebound.   Musculoskeletal:     Cervical back: Normal range of motion.   Skin:    General: Skin is warm and dry.   Neurological:     Mental Status: She is alert and oriented to person, place, and time.     Coordination: Coordination normal.      Vitals:   04/13/24 1432  BP: 120/64  Pulse: 69  Temp: 98.4 F (36.9 C)  TempSrc: Oral  SpO2: 98%  Weight: 163 lb (73.9 kg)  Height: 5' 5.5 (1.664 m)   Assessment & Plan:

## 2024-04-13 NOTE — Patient Instructions (Addendum)
 Lisa Cox , Thank you for taking time to come for your Medicare Wellness Visit. I appreciate your ongoing commitment to your health goals. Please review the following plan we discussed and let me know if I can assist you in the future.   These are the goals we discussed:  Goals   None     This is a list of the screening recommended for you and due dates:  Health Maintenance  Topic Date Due   DTaP/Tdap/Td vaccine (1 - Tdap) Never done   Zoster (Shingles) Vaccine (1 of 2) Never done   Pneumococcal Vaccine for age over 44 (2 of 2 - PCV) 07/12/2018   COVID-19 Vaccine (5 - 2024-25 season) 06/23/2023   Medicare Annual Wellness Visit  04/08/2024   Flu Shot  05/22/2024   DEXA scan (bone density measurement)  Addressed   HPV Vaccine  Aged Out   Meningitis B Vaccine  Aged Out   Colon Cancer Screening  Discontinued

## 2024-04-14 LAB — COMPREHENSIVE METABOLIC PANEL WITH GFR
ALT: 17 U/L (ref 0–35)
AST: 19 U/L (ref 0–37)
Albumin: 4.1 g/dL (ref 3.5–5.2)
Alkaline Phosphatase: 67 U/L (ref 39–117)
BUN: 20 mg/dL (ref 6–23)
CO2: 27 meq/L (ref 19–32)
Calcium: 9.4 mg/dL (ref 8.4–10.5)
Chloride: 101 meq/L (ref 96–112)
Creatinine, Ser: 1.21 mg/dL — ABNORMAL HIGH (ref 0.40–1.20)
GFR: 42.39 mL/min — ABNORMAL LOW (ref >60.00)
Glucose, Bld: 98 mg/dL (ref 70–99)
Potassium: 4.7 meq/L (ref 3.5–5.1)
Sodium: 136 meq/L (ref 135–145)
Total Bilirubin: 0.4 mg/dL (ref 0.2–1.2)
Total Protein: 6.7 g/dL (ref 6.0–8.3)

## 2024-04-16 NOTE — Assessment & Plan Note (Signed)
 Checking CBC and CMP and adjust as needed taking atenolol  and ramipril  and spironolactone  and BP at goal.

## 2024-04-16 NOTE — Assessment & Plan Note (Signed)
 Uses lorazepam  for sleep and continue she understands risk and benefit.

## 2024-04-16 NOTE — Assessment & Plan Note (Signed)
 BP at goal and on spironolactone  and ACE-I as well as beta blocker. Continue.

## 2024-04-16 NOTE — Assessment & Plan Note (Signed)
 Flu shot yearly. Pneumonia counseled. Shingrix due at pharmacy. Tetanus counseled due. Colonoscopy aged out future. Mammogram counseled continues to get, pap smear aged out and dexa complete. Counseled about sun safety and mole surveillance. Counseled about the dangers of distracted driving. Given 10 year screening recommendations.

## 2024-04-16 NOTE — Assessment & Plan Note (Signed)
 Taking pepcid  and well controlled.

## 2024-04-16 NOTE — Assessment & Plan Note (Signed)
 Still getting mammogram and we have discussed.

## 2024-04-16 NOTE — Assessment & Plan Note (Signed)
 Checking lipid panel and adjust as needed on statin.

## 2024-04-16 NOTE — Assessment & Plan Note (Signed)
 Stable and cardiology manages.

## 2024-04-17 ENCOUNTER — Ambulatory Visit: Payer: Self-pay | Admitting: Internal Medicine

## 2024-05-13 DIAGNOSIS — H52203 Unspecified astigmatism, bilateral: Secondary | ICD-10-CM | POA: Diagnosis not present

## 2024-05-13 DIAGNOSIS — Z961 Presence of intraocular lens: Secondary | ICD-10-CM | POA: Diagnosis not present

## 2024-05-13 DIAGNOSIS — H353131 Nonexudative age-related macular degeneration, bilateral, early dry stage: Secondary | ICD-10-CM | POA: Diagnosis not present

## 2024-06-19 ENCOUNTER — Other Ambulatory Visit: Payer: Self-pay | Admitting: Cardiology

## 2024-06-19 DIAGNOSIS — I119 Hypertensive heart disease without heart failure: Secondary | ICD-10-CM

## 2024-06-19 DIAGNOSIS — R0609 Other forms of dyspnea: Secondary | ICD-10-CM

## 2024-06-19 DIAGNOSIS — I10 Essential (primary) hypertension: Secondary | ICD-10-CM

## 2024-06-23 ENCOUNTER — Ambulatory Visit: Admitting: Internal Medicine

## 2024-06-23 ENCOUNTER — Encounter: Payer: Self-pay | Admitting: Internal Medicine

## 2024-06-23 ENCOUNTER — Ambulatory Visit: Payer: Self-pay | Admitting: *Deleted

## 2024-06-23 VITALS — BP 120/68 | HR 67 | Temp 97.7°F | Ht 65.5 in | Wt 165.0 lb

## 2024-06-23 DIAGNOSIS — N3000 Acute cystitis without hematuria: Secondary | ICD-10-CM

## 2024-06-23 DIAGNOSIS — R300 Dysuria: Secondary | ICD-10-CM

## 2024-06-23 LAB — POC URINALSYSI DIPSTICK (AUTOMATED)
Bilirubin, UA: NEGATIVE
Glucose, UA: NEGATIVE
Ketones, UA: NEGATIVE
Nitrite, UA: NEGATIVE
Protein, UA: NEGATIVE
Spec Grav, UA: 1.02 (ref 1.010–1.025)
Urobilinogen, UA: NEGATIVE U/dL — AB
pH, UA: 6 (ref 5.0–8.0)

## 2024-06-23 MED ORDER — CIPROFLOXACIN HCL 250 MG PO TABS: 250.0000 mg | ORAL_TABLET | Freq: Two times a day (BID) | ORAL | 0 refills | Status: AC

## 2024-06-23 NOTE — Patient Instructions (Signed)
Take the antibiotic as prescribed.  Take tylenol if needed.     Increase your water intake.   Call if no improvement     Urinary Tract Infection, Adult A urinary tract infection (UTI) is an infection of any part of the urinary tract, which includes the kidneys, ureters, bladder, and urethra. These organs make, store, and get rid of urine in the body. UTI can be a bladder infection (cystitis) or kidney infection (pyelonephritis). What are the causes? This infection may be caused by fungi, viruses, or bacteria. Bacteria are the most common cause of UTIs. This condition can also be caused by repeated incomplete emptying of the bladder during urination. What increases the risk? This condition is more likely to develop if:  You ignore your need to urinate or hold urine for long periods of time.  You do not empty your bladder completely during urination.  You wipe back to front after urinating or having a bowel movement, if you are female.  You are uncircumcised, if you are female.  You are constipated.  You have a urinary catheter that stays in place (indwelling).  You have a weak defense (immune) system.  You have a medical condition that affects your bowels, kidneys, or bladder.  You have diabetes.  You take antibiotic medicines frequently or for long periods of time, and the antibiotics no longer work well against certain types of infections (antibiotic resistance).  You take medicines that irritate your urinary tract.  You are exposed to chemicals that irritate your urinary tract.  You are female.  What are the signs or symptoms? Symptoms of this condition include:  Fever.  Frequent urination or passing small amounts of urine frequently.  Needing to urinate urgently.  Pain or burning with urination.  Urine that smells bad or unusual.  Cloudy urine.  Pain in the lower abdomen or back.  Trouble urinating.  Blood in the urine.  Vomiting or being less hungry than  normal.  Diarrhea or abdominal pain.  Vaginal discharge, if you are female.  How is this diagnosed? This condition is diagnosed with a medical history and physical exam. You will also need to provide a urine sample to test your urine. Other tests may be done, including:  Blood tests.  Sexually transmitted disease (STD) testing.  If you have had more than one UTI, a cystoscopy or imaging studies may be done to determine the cause of the infections. How is this treated? Treatment for this condition often includes a combination of two or more of the following:  Antibiotic medicine.  Other medicines to treat less common causes of UTI.  Over-the-counter medicines to treat pain.  Drinking enough water to stay hydrated.  Follow these instructions at home:  Take over-the-counter and prescription medicines only as told by your health care provider.  If you were prescribed an antibiotic, take it as told by your health care provider. Do not stop taking the antibiotic even if you start to feel better.  Avoid alcohol, caffeine, tea, and carbonated beverages. They can irritate your bladder.  Drink enough fluid to keep your urine clear or pale yellow.  Keep all follow-up visits as told by your health care provider. This is important.  Make sure to: ? Empty your bladder often and completely. Do not hold urine for long periods of time. ? Empty your bladder before and after sex. ? Wipe from front to back after a bowel movement if you are female. Use each tissue one time when you   wipe. Contact a health care provider if:  You have back pain.  You have a fever.  You feel nauseous or vomit.  Your symptoms do not get better after 3 days.  Your symptoms go away and then return. Get help right away if:  You have severe back pain or lower abdominal pain.  You are vomiting and cannot keep down any medicines or water. This information is not intended to replace advice given to you by  your health care provider. Make sure you discuss any questions you have with your health care provider. Document Released: 07/18/2005 Document Revised: 03/21/2016 Document Reviewed: 08/29/2015 Elsevier Interactive Patient Education  2018 [employer]vier Inc[employer]   

## 2024-06-23 NOTE — Progress Notes (Signed)
 Subjective:    Patient ID: Lisa Cox, female    DOB: 1945/12/04, 78 y.o.   MRN: [id number]      HPI Lisa Cox is here for  Chief Complaint  Patient presents with   Urinary Tract Infection    Burning, discomfort,nausea when urinating    Discussed the use of AI scribe software for clinical note transcription with the patient, who gave verbal consent to proceed.  History of Present Illness Lisa Cox is an 78 year old female with a history of urinary tract infections who presents with burning, fatigue, and nausea.  She has been experiencing burning, fatigue, and nausea since Saturday morning, three days prior to the visit. Despite taking Keflex  500 mg twice daily since Saturday, her symptoms have not improved. She sees urology and they gave her the Keflex  to use as needed for UTIs.  She last took it 06/03/24 for a presumed UTI but she does not think it helped.    She reports frequent urination, which she attributes to her use of spironolactone , but is unsure if it has increased in the last three days. No new urgency, hematuria, or changes in urine cloudiness and odor. She sometimes experiences difficulty urinating, describing it as needing to 'sort of strain' to finish. No new back pain, abdominal pain aside from nausea, or fever.     Medications and allergies reviewed with patient and updated if appropriate.  Current Outpatient Medications on File Prior to Visit  Medication Sig Dispense Refill   aspirin  81 MG tablet Take 81 mg by mouth every evening.     atenolol  (TENORMIN ) 50 MG tablet TAKE 1 TABLET BY MOUTH TWICE A DAY 180 tablet 2   atorvastatin  (LIPITOR) 10 MG tablet Take 1 tablet (10 mg total) by mouth daily. 90 tablet 1   carboxymethylcellulose (REFRESH PLUS) 0.5 % SOLN Place 1 drop into both eyes 3 (three) times daily as needed (dry eye).      Cholecalciferol (VITAMIN D3) 50 MCG (2000 UT) TABS Take 2,000 Units by mouth daily.      dimenhyDRINATE (DRAMAMINE) 50  MG tablet Take 50 mg by mouth at bedtime. Must take with Lorazepam      famotidine  (PEPCID ) 20 MG tablet Take 1 tablet (20 mg total) by mouth at bedtime as needed for heartburn or indigestion. 90 tablet 3   LORazepam  (ATIVAN ) 1 MG tablet TAKE 1 TABLET BY MOUTH EVERYDAY AT BEDTIME 90 tablet 1   Multiple Vitamins-Minerals (PRESERVISION AREDS 2) CAPS Take 1 capsule by mouth 2 (two) times daily.     ramipril  (ALTACE ) 10 MG capsule TAKE 1 CAPSULE BY MOUTH TWICE A DAY 180 capsule 0   sodium chloride  (OCEAN) 0.65 % SOLN nasal spray Place 1 spray into both nostrils as needed for congestion.     spironolactone  (ALDACTONE ) 25 MG tablet TAKE 1 TABLET (25 MG TOTAL) BY MOUTH DAILY. 90 tablet 3   triamcinolone  cream (KENALOG ) 0.1 % Apply 1 Application topically 2 (two) times daily. (Patient not taking: Reported on 04/13/2024) 30 g 0   No current facility-administered medications on file prior to visit.    Review of Systems  Constitutional:  Positive for fatigue. Negative for fever.  Gastrointestinal:  Positive for nausea. Negative for abdominal pain.  Genitourinary:  Positive for difficulty urinating (sometimes has to strain), dysuria and urgency (chronic). Negative for frequency and hematuria.       No urine odor or cloudiness  Musculoskeletal:  Negative for back pain.  Objective:   Vitals:   06/23/24 1503  BP: 120/68  Pulse: 67  Temp: 97.7 F (36.5 C)  SpO2: 97%   BP Readings from Last 3 Encounters:  06/23/24 120/68  04/13/24 120/64  06/28/23 136/62   Wt Readings from Last 3 Encounters:  06/23/24 165 lb (74.8 kg)  04/13/24 163 lb (73.9 kg)  06/28/23 162 lb (73.5 kg)   Body mass index is 27.04 kg/m.    Physical Exam Constitutional:      General: She is not in acute distress.    Appearance: Normal appearance. She is not ill-appearing.  HENT:     Head: Normocephalic.  Eyes:     Conjunctiva/sclera: Conjunctivae normal.  Abdominal:     General: There is no distension.      Palpations: Abdomen is soft.     Tenderness: There is no abdominal tenderness. There is no right CVA tenderness, left CVA tenderness, guarding or rebound.  Skin:    General: Skin is warm and dry.  Neurological:     Mental Status: She is alert.            Assessment & Plan:    Assessment and Plan Assessment & Plan Acute urinary tract infection Recurrent UTI with dysuria, fatigue, and nausea. Keflex  ineffective, suggesting resistance. Allergic to nitrofurantoin and does not do well with sulfa  drugs. Previous culture showed resistance to penicillins and Macrobid.  - Discontinue Keflex . - Prescribe Cipro  250 mg twice daily for three days. - Send urine for culture and sensitivity testing. - Advise to increase fluid intake. - Recommend Tylenol  for symptom relief if needed. - Review urine culture results in two days and adjust treatment if necessary.

## 2024-06-23 NOTE — Telephone Encounter (Signed)
 FYI Only or Action Required?: FYI only for provider.  Patient was last seen in primary care on 04/13/2024 by Rollene Almarie LABOR, MD.  Called Nurse Triage reporting Urinary Frequency.  Symptoms began several days ago.  Interventions attempted: Prescription medications: keflex .  Symptoms are: unchanged.  Triage Disposition: See Physician Within 24 Hours  Patient/caregiver understands and will follow disposition?: Yes     from CRM #[id number]. Topic: Clinical - Red Word Triage >> Jun 23, 2024  8:10 AM Donna BRAVO wrote: Red Word that prompted transfer to Nurse Triage: patient has possible UTI, pain and burning when urinating, over all not feeling well, going on over the weekend Reason for Disposition  More than 2 UTI's in last year  Answer Assessment - Initial Assessment Questions Appt today with other provider. None available with PCP until next week. Patient has taken keflex  and continues with UTI sx.      1. SEVERITY: How bad is the pain?  (e.g., Scale 1-10; mild, moderate, or severe)     Severe burning 2. FREQUENCY: How many times have you had painful urination today?      Not everytime 3. PATTERN: Is pain present every time you urinate or just sometimes?      na 4. ONSET: When did the painful urination start?      Saturday  5. FEVER: Do you have a fever? If Yes, ask: What is your temperature, how was it measured, and when did it start?     na 6. PAST UTI: Have you had a urine infection before? If Yes, ask: When was the last time? and What happened that time?      Yes has seen urology in the past multiple UTIs 7. CAUSE: What do you think is causing the painful urination?  (e.g., UTI, scratch, Herpes sore)     UTI  8. OTHER SYMPTOMS: Do you have any other symptoms? (e.g., blood in urine, flank pain, genital sores, urgency, vaginal discharge)     Not feeling well, and burning with urination since Saturday . 9. PREGNANCY: Is there any chance you are  pregnant? When was your last menstrual period?     na  Protocols used: Urination Pain - Female-A-AH

## 2024-06-25 ENCOUNTER — Ambulatory Visit: Payer: Self-pay | Admitting: Internal Medicine

## 2024-06-25 LAB — CULTURE, URINE COMPREHENSIVE: RESULT:: NO GROWTH

## 2024-06-29 ENCOUNTER — Other Ambulatory Visit: Payer: Self-pay | Admitting: Internal Medicine

## 2024-07-03 ENCOUNTER — Ambulatory Visit: Admitting: Internal Medicine

## 2024-07-03 ENCOUNTER — Ambulatory Visit: Payer: Self-pay | Admitting: Internal Medicine

## 2024-07-03 VITALS — BP 108/62 | HR 70 | Temp 97.8°F | Ht 65.5 in | Wt 167.0 lb

## 2024-07-03 DIAGNOSIS — N1831 Chronic kidney disease, stage 3a: Secondary | ICD-10-CM | POA: Diagnosis not present

## 2024-07-03 DIAGNOSIS — F918 Other conduct disorders: Secondary | ICD-10-CM | POA: Insufficient documentation

## 2024-07-03 DIAGNOSIS — R509 Fever, unspecified: Secondary | ICD-10-CM | POA: Insufficient documentation

## 2024-07-03 DIAGNOSIS — I10 Essential (primary) hypertension: Secondary | ICD-10-CM | POA: Diagnosis not present

## 2024-07-03 LAB — URINALYSIS, ROUTINE W REFLEX MICROSCOPIC
Bilirubin Urine: NEGATIVE
Ketones, ur: NEGATIVE
Nitrite: NEGATIVE
Specific Gravity, Urine: 1.02 (ref 1.000–1.030)
Urine Glucose: NEGATIVE
Urobilinogen, UA: 0.2 (ref 0.0–1.0)
pH: 5.5 (ref 5.0–8.0)

## 2024-07-03 LAB — CBC WITH DIFFERENTIAL/PLATELET
Basophils Absolute: 0.1 K/uL (ref 0.0–0.1)
Basophils Relative: 0.6 % (ref 0.0–3.0)
Eosinophils Absolute: 0.3 K/uL (ref 0.0–0.7)
Eosinophils Relative: 2.8 % (ref 0.0–5.0)
HCT: 35.6 % — ABNORMAL LOW (ref 36.0–46.0)
Hemoglobin: 11.7 g/dL — ABNORMAL LOW (ref 12.0–15.0)
Lymphocytes Relative: 20.2 % (ref 12.0–46.0)
Lymphs Abs: 2.4 K/uL (ref 0.7–4.0)
MCHC: 32.8 g/dL (ref 30.0–36.0)
MCV: 91.4 fl (ref 78.0–100.0)
Monocytes Absolute: 1 K/uL (ref 0.1–1.0)
Monocytes Relative: 8.6 % (ref 3.0–12.0)
Neutro Abs: 8.2 K/uL — ABNORMAL HIGH (ref 1.4–7.7)
Neutrophils Relative %: 67.8 % (ref 43.0–77.0)
Platelets: 346 K/uL (ref 150.0–400.0)
RBC: 3.9 Mil/uL (ref 3.87–5.11)
RDW: 13.6 % (ref 11.5–15.5)
WBC: 12.1 K/uL — ABNORMAL HIGH (ref 4.0–10.5)

## 2024-07-03 LAB — BASIC METABOLIC PANEL WITH GFR
BUN: 24 mg/dL — ABNORMAL HIGH (ref 6–23)
CO2: 28 meq/L (ref 19–32)
Calcium: 9.1 mg/dL (ref 8.4–10.5)
Chloride: 93 meq/L — ABNORMAL LOW (ref 96–112)
Creatinine, Ser: 1.6 mg/dL — ABNORMAL HIGH (ref 0.40–1.20)
GFR: 30.27 mL/min — ABNORMAL LOW (ref >60.00)
Glucose, Bld: 95 mg/dL (ref 70–99)
Potassium: 4.9 meq/L (ref 3.5–5.1)
Sodium: 129 meq/L — ABNORMAL LOW (ref 135–145)

## 2024-07-03 LAB — HEPATIC FUNCTION PANEL
ALT: 23 U/L (ref 0–35)
AST: 20 U/L (ref 0–37)
Albumin: 3.4 g/dL — ABNORMAL LOW (ref 3.5–5.2)
Alkaline Phosphatase: 77 U/L (ref 39–117)
Bilirubin, Direct: 0.1 mg/dL (ref 0.0–0.3)
Total Bilirubin: 0.3 mg/dL (ref 0.2–1.2)
Total Protein: 6.7 g/dL (ref 6.0–8.3)

## 2024-07-03 NOTE — Progress Notes (Unsigned)
 Patient ID: Lisa Cox, female   DOB: 10-08-1946, 78 y.o.   MRN: [id number]        Chief Complaint: follow up low grade temp, recent UTI       HPI:  Lisa Cox is a 78 y.o. female here with c/o        Covid and flu neg by report Wt Readings from Last 3 Encounters:  07/03/24 167 lb (75.8 kg)  06/23/24 165 lb (74.8 kg)  04/13/24 163 lb (73.9 kg)   BP Readings from Last 3 Encounters:  06/23/24 120/68  04/13/24 120/64  06/28/23 136/62         Past Medical History:  Diagnosis Date   Abdominal pain, epigastric 11/12/2019   Acute cystitis with hematuria 10/29/2019   Anxiety    Bell's palsy 2009   Benign hypertensive heart disease without heart failure 07/11/2011   Breast cancer (HCC)    Cancer (HCC) 11/2009   breast,lumpectomy right side followed by radiation   Depression    Dysuria 10/29/2019   Fatigue 05/26/2019   Frequent unifocal PVCs 07/2019   Frequent PVCs with some bigeminy noted on 48-hour monitor. No arrhythmias.==> s/p RVOT PVC ablation October 2020   GERD (gastroesophageal reflux disease)    Hypercholesterolemia 07/11/2011   Insomnia 04/10/2016   Moderate aortic regurgitation    Follow-up echocardiogram January 2024 shows mild AI   Nausea 11/12/2019   Personal history of radiation therapy    Routine general medical examination at a health care facility 07/12/2017   UTI symptoms 10/02/2019   Past Surgical History:  Procedure Laterality Date   BREAST LUMPECTOMY Right 11/2009   right breast,followed by radiation therapy   CARDIAC EVENT MONITOR     Predominantly sinus rhythm with first-degree block. Minimum heart rate 63 bpm. Maximum heart rate 139 bpm. Average is 85 bpm. 17.5% PVCs.  V bigem & Trigem noted.     EYE SURGERY  1953   skin removal in eye   GXT  04/2017   Exercise tolerance test off of beta-blockers: Normal blood pressure response to exercise.  No EKG changes to suggest ischemic changes.  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   Seen at rest, exercise and recovery.   NM MYOVIEW  LTD  08/2018   LOW RISK. No ischemia or infarction   PVC ABLATION N/A 08/19/2019   Procedure: PVC ABLATION;  Surgeon: Inocencio Soyla Lunger, MD;  Location: [facility];  Service: Cardiovascular;  Laterality: N/A;   TRANSTHORACIC ECHOCARDIOGRAM  07/11/2020   a) 09/10/2018: EF 55-60%.  No RWMA. Gr 2 DD. Mod AI.; b) 07/11/2020 normal LV size and function.  EF 55-60%.  Moderate asymmetric with basal septal hypertrophy.  Indeterminate diastolic pressures.  Small pericardial effusion, moderate AI.  Normal CVP.   TRANSTHORACIC ECHOCARDIOGRAM  10/2022   EF 55 to 60%.  No RWMA.  GR 1 DD.  No LA dilation.  Normal RV size and function.  Normal RAP.  AI-mild.  Normal MV.--AI improved previously moderate.   TUBAL LIGATION  1978    reports that she has never smoked. She has never used smokeless tobacco. She reports that she does not drink alcohol and does not use drugs. family history includes Breast cancer (age of onset: 26) in her paternal aunt; Heart attack in her father; Heart disease in her father; Hypertension in her brother and sister; Pancreatic cancer in her father; Stroke in her maternal aunt. Allergies  Allergen Reactions   Hctz [Hydrochlorothiazide ] Other (See Comments)  Patient developed significant hyponatremia shortly after starting.  Would also include chlorthalidone   Amlodipine  Other (See Comments)    Peripheral edema   Crestor [Rosuvastatin Calcium ] Other (See Comments)    Leg cramps   Erythromycin Nausea Only   Macrobid [Nitrofurantoin Monohyd Macro] Nausea And Vomiting   Niacin And Related Itching   Restoril Other (See Comments)    Reaction-nervous   Zolpidem Tartrate     Reaction-nervous   Current Outpatient Medications on File Prior to Visit  Medication Sig Dispense Refill   aspirin  81 MG tablet Take 81 mg by mouth every evening.     atenolol  (TENORMIN ) 50 MG tablet TAKE 1 TABLET BY MOUTH TWICE A DAY 180 tablet 2    atorvastatin  (LIPITOR) 10 MG tablet Take 1 tablet (10 mg total) by mouth daily. 90 tablet 1   carboxymethylcellulose (REFRESH PLUS) 0.5 % SOLN Place 1 drop into both eyes 3 (three) times daily as needed (dry eye).      Cholecalciferol (VITAMIN D3) 50 MCG (2000 UT) TABS Take 2,000 Units by mouth daily.      dimenhyDRINATE (DRAMAMINE) 50 MG tablet Take 50 mg by mouth at bedtime. Must take with Lorazepam      famotidine  (PEPCID ) 20 MG tablet Take 1 tablet (20 mg total) by mouth at bedtime as needed for heartburn or indigestion. 90 tablet 3   LORazepam  (ATIVAN ) 1 MG tablet TAKE 1 TABLET BY MOUTH EVERYDAY AT BEDTIME 90 tablet 1   Multiple Vitamins-Minerals (PRESERVISION AREDS 2) CAPS Take 1 capsule by mouth 2 (two) times daily.     ramipril  (ALTACE ) 10 MG capsule TAKE 1 CAPSULE BY MOUTH TWICE A DAY 180 capsule 0   sodium chloride  (OCEAN) 0.65 % SOLN nasal spray Place 1 spray into both nostrils as needed for congestion.     spironolactone  (ALDACTONE ) 25 MG tablet TAKE 1 TABLET (25 MG TOTAL) BY MOUTH DAILY. 90 tablet 3   No current facility-administered medications on file prior to visit.        ROS:  All others reviewed and negative.  Objective        PE:  Ht 5' 5.5 (1.664 m)   Wt 167 lb (75.8 kg)   BMI 27.37 kg/m                 Constitutional: Pt appears in NAD               HENT: Head: NCAT.                Right Ear: External ear normal.                 Left Ear: External ear normal.                Eyes: . Pupils are equal, round, and reactive to light. Conjunctivae and EOM are normal               Nose: without d/c or deformity               Neck: Neck supple. Gross normal ROM               Cardiovascular: Normal rate and regular rhythm.                 Pulmonary/Chest: Effort normal and breath sounds without rales or wheezing.                Abd:  Soft, NT, ND, + BS, no organomegaly  Neurological: Pt is alert. At baseline orientation, motor grossly intact                Skin: Skin is warm. No rashes, no other new lesions, LE edema - ***               Psychiatric: Pt behavior is normal without agitation   Micro: none  Cardiac tracings I have personally interpreted today:  none  Pertinent Radiological findings (summarize): none   Lab Results  Component Value Date   WBC 8.5 04/13/2024   HGB 12.5 04/13/2024   HCT 37.2 04/13/2024   PLT 187.0 04/13/2024   GLUCOSE 98 04/13/2024   CHOL 123 04/13/2024   TRIG 185.0 (H) 04/13/2024   HDL 35.20 (L) 04/13/2024   LDLDIRECT 56.0 04/09/2023   LDLCALC 51 04/13/2024   ALT 17 04/13/2024   AST 19 04/13/2024   NA 136 04/13/2024   K 4.7 04/13/2024   CL 101 04/13/2024   CREATININE 1.21 (H) 04/13/2024   BUN 20 04/13/2024   CO2 27 04/13/2024   TSH 5.11 05/22/2022   INR 0.9 05/09/2008   HGBA1C 5.7 04/13/2024   Assessment/Plan:  Lisa Cox is a 78 y.o. White or Caucasian [1] female with  has a past medical history of Abdominal pain, epigastric (11/12/2019), Acute cystitis with hematuria (10/29/2019), Anxiety, Bell's palsy (2009), Benign hypertensive heart disease without heart failure (07/11/2011), Breast cancer (HCC), Cancer (HCC) (11/2009), Depression, Dysuria (10/29/2019), Fatigue (05/26/2019), Frequent unifocal PVCs (07/2019), GERD (gastroesophageal reflux disease), Hypercholesterolemia (07/11/2011), Insomnia (04/10/2016), Moderate aortic regurgitation, Nausea (11/12/2019), Personal history of radiation therapy, Routine general medical examination at a health care facility (07/12/2017), and UTI symptoms (10/02/2019).  No problem-specific Assessment & Plan notes found for this encounter.  Followup: No follow-ups on file.  Lynwood Rush, MD 07/03/2024 3:44 PM [facility]

## 2024-07-03 NOTE — Patient Instructions (Signed)
Please continue all other medications as before, and refills have been done if requested.  Please have the pharmacy call with any other refills you may need.  Please keep your appointments with your specialists as you may have planned  Please go to the LAB at the blood drawing area for the tests to be done  You will be contacted by phone if any changes need to be made immediately.  Otherwise, you will receive a letter about your results with an explanation, but please check with MyChart first.

## 2024-07-04 ENCOUNTER — Encounter: Payer: Self-pay | Admitting: Internal Medicine

## 2024-07-04 DIAGNOSIS — R5383 Other fatigue: Secondary | ICD-10-CM | POA: Diagnosis not present

## 2024-07-04 DIAGNOSIS — M791 Myalgia, unspecified site: Secondary | ICD-10-CM | POA: Diagnosis not present

## 2024-07-04 DIAGNOSIS — N1831 Chronic kidney disease, stage 3a: Secondary | ICD-10-CM | POA: Insufficient documentation

## 2024-07-04 DIAGNOSIS — R630 Anorexia: Secondary | ICD-10-CM | POA: Diagnosis not present

## 2024-07-04 LAB — URINE CULTURE: Result:: NO GROWTH

## 2024-07-04 NOTE — Assessment & Plan Note (Signed)
 For f/u bmp with labs

## 2024-07-04 NOTE — Assessment & Plan Note (Signed)
 BP Readings from Last 3 Encounters:  07/03/24 108/62  06/23/24 120/68  04/13/24 120/64   Stable, pt to continue medical treatment tenormin  50 every day, altace  10 bid, aldactone  25 qd

## 2024-07-04 NOTE — Assessment & Plan Note (Signed)
 Exam benign but could have possible persistent uti due to suboptimal cipro  course stopped due the nausea after 4 days.  Today for Ua nd Cx, with tx pending results

## 2024-07-07 ENCOUNTER — Encounter: Payer: Self-pay | Admitting: Cardiology

## 2024-07-07 ENCOUNTER — Ambulatory Visit: Attending: Cardiology | Admitting: Cardiology

## 2024-07-07 VITALS — BP 160/80 | HR 84 | Ht 65.5 in | Wt 171.0 lb

## 2024-07-07 DIAGNOSIS — E7800 Pure hypercholesterolemia, unspecified: Secondary | ICD-10-CM

## 2024-07-07 DIAGNOSIS — I10 Essential (primary) hypertension: Secondary | ICD-10-CM | POA: Diagnosis not present

## 2024-07-07 DIAGNOSIS — I119 Hypertensive heart disease without heart failure: Secondary | ICD-10-CM | POA: Diagnosis not present

## 2024-07-07 DIAGNOSIS — R0609 Other forms of dyspnea: Secondary | ICD-10-CM

## 2024-07-07 DIAGNOSIS — I493 Ventricular premature depolarization: Secondary | ICD-10-CM | POA: Diagnosis not present

## 2024-07-07 DIAGNOSIS — N1831 Chronic kidney disease, stage 3a: Secondary | ICD-10-CM

## 2024-07-07 NOTE — Patient Instructions (Signed)
 Medication Instructions:    Stop taking spironolactone  until you see your primary  Dr Verdis.  *If you need a refill on your cardiac medications before your next appointment, please call your pharmacy*   Lab Work: Not needed    Testing/Procedures: Not needed   Follow-Up: At [facility], you and your health needs are our priority.  As part of our continuing mission to provide you with exceptional heart care, we have created designated Provider Care Teams.  These Care Teams include your primary Cardiologist (physician) and Advanced Practice Providers (APPs -  Physician Assistants and Nurse Practitioners) who all work together to provide you with the care you need, when you need it.     Your next appointment:   12 month(s)  The format for your next appointment:   In Person  Provider:   Alm Clay, MD

## 2024-07-07 NOTE — Progress Notes (Unsigned)
 Cardiology Office Note:  .   Date:  07/09/2024  ID:  Lisa Cox, DOB 1946/01/21, MRN [id number] PCP: Lisa Almarie DELENA, MD  [facility] Providers Cardiologist:  Alm Clay, MD     Chief Complaint  Patient presents with   Follow-up    Does not feel well today.  Some type of illness is going on.  Did not take her blood pressure medicines today.   Hypertension    Did not take BP meds today.  Not feeling well.    Patient Profile: Lisa     PEIGHTON Cox is a 78 y.o. female with a PMH notable for labile/difficult control HTN and frequent RVOT PVCs (s/p RVOT ablation October 2020), intermittent LBBB who presents here for annual follow-up at the request of Lisa Cox, *.  Lisa Cox was last seen on November 27, 2022 still having issues with blood pressure.  Was noting intermittent episodes of a weird burning sensation across her chest that also would go down the sides of her waist and hips.  But nothing really bothering her too much.  Noted BP range between 145/70-190/85.  No headache or blurred vision.  Trivial edema.  Some dizziness but no real orthostasis.  Palpitations better being on regular dose of atenolol .  Noted some joint pain and memory loss.  Felt to have a rate related bundle branch Plan was to convert from ramipril  to losartan  and increased to 320 mg.  Added spironolactone  with plans to titrate up from half tablet to full tablet.  Was in to see CV[facility] for BP management.  She was most most recently seen on January 14, 2023 by Dr. Inocencio for EP consultation for PVCs.  Noted that she had not tolerated flecainide , diltiazem  or mexiletine.  Underwent ablation in October 2020.  Done well since ablation with no further concerns or PVCs.  She did have a left bundle branch block that began in December 2020 that is since normalized.  Plan was for EP follow-up as needed.      I last saw Lisa Cox on June 28, 2023.  She was doing fairly well.  Noting  an occasional burning sensation in the chest off-and-on that was relatively random.  Some exhaustion with overexertion but not with routine activities.  Mild edema.  DOE and exhaustion with overexertion.  Rare edema.  Symptoms not felt to be consistent with heart failure with trivial end of day swelling no PND orthopnea.  Chest pain not felt to be ischemic in nature.  PVCs were notably improved after ablation.  Blood pressure also appear to be stable.  Subjective  Lisa Cox returns here today with I think it is her daughter who helps out with some of the issues.  She feels somewhat off today, was just in the ER on 13 September for fatigue and decreased appetite there was suggestion of possible UTI but this did not seem like her UTI symptoms.  She was having night sweats and other subtle symptoms.  Some nausea and generalized fatigue.  Apparently she has been seen by Dr. Norleen shortly before that for low-grade fever.  The symptoms have been ongoing for couple weeks now and has not been any improvement with antibiotics. With her ongoing symptoms, it somewhat difficult to assess her cardiac symptomatology.  Her blood pressure is little high today but she is not feeling well, not sleeping well and therefore her blood pressure probably is not accurate she was 149/68 in the ER  on 13th, but 108/62 at her PCPs office on the 12th.  She denies any headaches or blurry vision. She denied any chest pain or increased exertional dyspnea.  No wheezing orthopnea or PND.  No edema.  Feeling weak and tired but no syncope or near syncope.  No TIA or amaurosis fugax.  No palpitations.  ROS:  Review of Systems - Negative except unusual symptoms noted in HPI. Notes hip pain, low-grade fevers.  Generalized pain also in the knees.  Aches all over.  Denies dysuria or hematuria.  Symptoms not improved with Cipro .  No URI symptoms of congestion or coughing or sneezing.  No worsening dyspnea.  Poor appetite.  Not eating and  drinking well.  Did not take medicines today.    Objective   Current Meds  Medication Sig   aspirin  81 MG tablet Take 81 mg by mouth every evening.   atenolol  (TENORMIN ) 50 MG tablet TAKE 1 TABLET BY MOUTH TWICE A DAY   atorvastatin  (LIPITOR) 10 MG tablet Take 1 tablet (10 mg total) by mouth daily.   ramipril  (ALTACE ) 10 MG capsule TAKE 1 CAPSULE BY MOUTH TWICE A DAY (Patient taking differently:  Patient takes once a day)   spironolactone  (ALDACTONE ) 25 MG tablet TAKE 1 TABLET (25 MG TOTAL) BY MOUTH DAILY.    carboxymethylcellulose (REFRESH PLUS) 0.5 % SOLN Place 1 drop into both eyes 3 (three) times daily as needed (dry eye).    Cholecalciferol (VITAMIN D3) 50 MCG (2000 UT) TABS Take 2,000 Units by mouth daily.    dimenhyDRINATE (DRAMAMINE) 50 MG tablet Take 50 mg by mouth at bedtime. Must take with Lorazepam    famotidine  (PEPCID ) 20 MG tablet Take 1 tablet (20 mg total) by mouth at bedtime as needed for heartburn or indigestion.   LORazepam  (ATIVAN ) 1 MG tablet TAKE 1 TABLET BY MOUTH EVERYDAY AT BEDTIME   Multiple Vitamins-Minerals (PRESERVISION AREDS 2) CAPS Take 1 capsule by mouth 2 (two) times daily.   sodium chloride  (OCEAN) 0.65 % SOLN nasal spray Place 1 spray into both nostrils as needed for congestion.    Studies Reviewed: Lisa   EKG Interpretation Date/Time:  Tuesday July 07 2024 14:14:43 EDT Ventricular Rate:  84 PR Interval:  206 QRS Duration:  66 QT Interval:  342 QTC Calculation: 404 R Axis:   -12  Text Interpretation: Normal sinus rhythm When compared with ECG of 28-Jun-2023 09:44, Left bundle branch block is no longer Present Confirmed by Anner Lenis ([id number]) on 07/07/2024 3:07:15 PM    Labs (04/13/2024): TC 123, TG 185, HDL 35, LDL 51.  A1c 5.7 07/03/2024: Hgb 11.7,Cr 1.6, K+ 4.9, PLT 346 07/04/2024 Na 136 - 146 mmol/L 127 Low   Potassium 3.7 - 5.4 mmol/L 5.1  Cl 97 - 108 mmol/L 96 Low   CO2 20 - 32 mmol/L 22  AGAP 7 - 16 mmol/L 9  Glucose 65 - 99  mg/dL 85  BUN 8 - 27 mg/dL 19  Creatinine 9.42 - 8.99 mg/dL 8.74 High   Ca 8.6 - 89.7 mg/dL 8.1 Low   BUN/CREAT RATIO 11.0 - 26.0 15.2  eGFR >=60 mL/min/1.74m2 44 Low    Component 07/04/24 (07/03/24) (04/13/24) (04/09/23)  WBC 12.1 High  12.1 High  8.5 8.0  Hemoglobin 11.1 Low  11.7 Low  12.5 12.7  Platelets 342.0 346.0 187.0 202.0    ECHO (11/20/2022): LVEF 55 to 60%.  No RWMA.  G1 DD.  Normal RV size and function.  Normal valves.  Normal RAP.  Risk Assessment/Calculations:     HYPERTENSION CONTROL Vitals:   07/07/24 1408  BP: (!) 160/80    The patient's blood pressure is elevated above target today.  In order to address the patient's elevated BP: Blood pressure will be monitored at home to determine if medication changes need to be made. (She has not been eating and drinking well.  Did not take her medications today.)        Physical Exam:   VS:  BP (!) 160/80   Pulse 84   Ht 5' 5.5 (1.664 m)   Wt 171 lb (77.6 kg)   SpO2 98%   BMI 28.02 kg/m    Wt Readings from Last 3 Encounters:  07/08/24 170 lb (77.1 kg)  07/07/24 171 lb (77.6 kg)  07/03/24 167 lb (75.8 kg)      GEN: Well nourished, well developed in no acute distress, but does seem somewhat ill appearing.  Seems tired and worn out NECK: No JVD; No carotid bruits CARDIAC: Normal S1, S2; RRR, no murmurs, rubs, gallops RESPIRATORY:  Clear to auscultation without rales, wheezing or rhonchi ; nonlabored, good air movement. ABDOMEN: Soft, non-tender, non-distended EXTREMITIES:  No edema; No deformity      ASSESSMENT AND PLAN: .    Problem List Items Addressed This Visit       Cardiology Problems   Benign hypertensive heart disease without heart failure (Chronic)   Blood pressures have been pretty much at goal but today a little high.  For now we will continue beta-blocker, ARB and spironolactone  at current doses. Seems relatively euvolemic if anything elevated dehydrated.      Relevant Orders   EKG  12-Lead (Completed)   Essential hypertension - Primary (Chronic)   Blood pressure is high today, but has been better controlled on similar medications just recently.  I think this is probably more related to her ongoing illness.  Would not adjust medicines at this time.  For now we will just have to continue the medicines that had her controlled for a while including 50 mg twice daily atenolol  and ramipril  which she is now taking 10 mg just once daily along with spironolactone  25 mg daily.      Relevant Orders   EKG 12-Lead (Completed)   Frequent unifocal PVCs (Chronic)   Pretty much asymptomatic now after ablation.  Remains on atenolol  50 mg twice daily with adequate heart rate control.      Hypercholesterolemia (Chronic)   Lipids are well-controlled with an LDL of 51 on recent labs.  She is on 10 mg atorvastatin . Continue atorvastatin         Other   CKD stage 3a, GFR 45-59 ml/min (HCC) (Chronic)   Creatinine seems to be stable but she is somewhat hyponatremic.  Defer to PCP.      DOE (dyspnea on exertion) - with throat tightness (Chronic)   I think her symptoms are probably related to some type of viral illness.  Sounds like she is having low-grade fevers with myalgias.  I recommend that she sees her PCP to discuss these symptoms.  I do not think it sounds like UTI.      Relevant Orders   EKG 12-Lead (Completed)            Follow-Up: Return in about 1 year (around 07/07/2025) for 1 Yr Follow-up, [employer].  I spent 42 minutes in the care of Lisa Cox today including reviewing labs (from epic and [facility]-3 minutes), reviewing studies (no  real recent studies-previous echo reviewed-2 minutes), face to face time discussing treatment options (20 minutes), reviewing records from recent clinic visits and ER visits (6 minutes), 11 minutes dictating, and documenting in the encounter.      Signed, Alm MICAEL Clay, MD, MS Alm Clay, M.D.,  M.S. Interventional Cardiologist  [facility] Pager # [phone]

## 2024-07-08 ENCOUNTER — Ambulatory Visit (INDEPENDENT_AMBULATORY_CARE_PROVIDER_SITE_OTHER)

## 2024-07-08 ENCOUNTER — Ambulatory Visit: Admitting: Internal Medicine

## 2024-07-08 ENCOUNTER — Ambulatory Visit: Payer: Self-pay | Admitting: *Deleted

## 2024-07-08 ENCOUNTER — Encounter: Payer: Self-pay | Admitting: Internal Medicine

## 2024-07-08 VITALS — BP 140/70 | HR 94 | Temp 99.2°F | Ht 65.0 in | Wt 170.0 lb

## 2024-07-08 DIAGNOSIS — M8580 Other specified disorders of bone density and structure, unspecified site: Secondary | ICD-10-CM | POA: Diagnosis not present

## 2024-07-08 DIAGNOSIS — M25552 Pain in left hip: Secondary | ICD-10-CM | POA: Diagnosis not present

## 2024-07-08 DIAGNOSIS — R350 Frequency of micturition: Secondary | ICD-10-CM

## 2024-07-08 DIAGNOSIS — M791 Myalgia, unspecified site: Secondary | ICD-10-CM | POA: Diagnosis not present

## 2024-07-08 DIAGNOSIS — R5383 Other fatigue: Secondary | ICD-10-CM

## 2024-07-08 DIAGNOSIS — N179 Acute kidney failure, unspecified: Secondary | ICD-10-CM

## 2024-07-08 DIAGNOSIS — I771 Stricture of artery: Secondary | ICD-10-CM | POA: Diagnosis not present

## 2024-07-08 DIAGNOSIS — R509 Fever, unspecified: Secondary | ICD-10-CM

## 2024-07-08 DIAGNOSIS — M2550 Pain in unspecified joint: Secondary | ICD-10-CM

## 2024-07-08 DIAGNOSIS — I700 Atherosclerosis of aorta: Secondary | ICD-10-CM | POA: Diagnosis not present

## 2024-07-08 DIAGNOSIS — M160 Bilateral primary osteoarthritis of hip: Secondary | ICD-10-CM | POA: Diagnosis not present

## 2024-07-08 DIAGNOSIS — J90 Pleural effusion, not elsewhere classified: Secondary | ICD-10-CM | POA: Diagnosis not present

## 2024-07-08 LAB — CBC WITH DIFFERENTIAL/PLATELET
Basophils Absolute: 0.1 K/uL (ref 0.0–0.1)
Basophils Relative: 0.5 % (ref 0.0–3.0)
Eosinophils Absolute: 0.3 K/uL (ref 0.0–0.7)
Eosinophils Relative: 2.6 % (ref 0.0–5.0)
HCT: 34.2 % — ABNORMAL LOW (ref 36.0–46.0)
Hemoglobin: 11.4 g/dL — ABNORMAL LOW (ref 12.0–15.0)
Lymphocytes Relative: 16.7 % (ref 12.0–46.0)
Lymphs Abs: 2.2 K/uL (ref 0.7–4.0)
MCHC: 33.2 g/dL (ref 30.0–36.0)
MCV: 90.3 fl (ref 78.0–100.0)
Monocytes Absolute: 1.1 K/uL — ABNORMAL HIGH (ref 0.1–1.0)
Monocytes Relative: 8.2 % (ref 3.0–12.0)
Neutro Abs: 9.6 K/uL — ABNORMAL HIGH (ref 1.4–7.7)
Neutrophils Relative %: 72 % (ref 43.0–77.0)
Platelets: 425 K/uL — ABNORMAL HIGH (ref 150.0–400.0)
RBC: 3.79 Mil/uL — ABNORMAL LOW (ref 3.87–5.11)
RDW: 14 % (ref 11.5–15.5)
WBC: 13.3 K/uL — ABNORMAL HIGH (ref 4.0–10.5)

## 2024-07-08 LAB — COMPREHENSIVE METABOLIC PANEL WITH GFR
ALT: 25 U/L (ref 0–35)
AST: 22 U/L (ref 0–37)
Albumin: 3.4 g/dL — ABNORMAL LOW (ref 3.5–5.2)
Alkaline Phosphatase: 77 U/L (ref 39–117)
BUN: 15 mg/dL (ref 6–23)
CO2: 27 meq/L (ref 19–32)
Calcium: 9.2 mg/dL (ref 8.4–10.5)
Chloride: 94 meq/L — ABNORMAL LOW (ref 96–112)
Creatinine, Ser: 1.03 mg/dL (ref 0.40–1.20)
GFR: 51.34 mL/min — ABNORMAL LOW (ref >60.00)
Glucose, Bld: 125 mg/dL — ABNORMAL HIGH (ref 70–99)
Potassium: 4.6 meq/L (ref 3.5–5.1)
Sodium: 130 meq/L — ABNORMAL LOW (ref 135–145)
Total Bilirubin: 0.3 mg/dL (ref 0.2–1.2)
Total Protein: 6.8 g/dL (ref 6.0–8.3)

## 2024-07-08 LAB — URINALYSIS, ROUTINE W REFLEX MICROSCOPIC
Bilirubin Urine: NEGATIVE
Ketones, ur: NEGATIVE
Nitrite: NEGATIVE
Specific Gravity, Urine: 1.02 (ref 1.000–1.030)
Total Protein, Urine: NEGATIVE
Urine Glucose: NEGATIVE
Urobilinogen, UA: 0.2 (ref 0.0–1.0)
pH: 5.5 (ref 5.0–8.0)

## 2024-07-08 LAB — SEDIMENTATION RATE: Sed Rate: 66 mm/h — ABNORMAL HIGH (ref 0–30)

## 2024-07-08 LAB — C-REACTIVE PROTEIN: CRP: 18.2 mg/dL (ref 0.5–20.0)

## 2024-07-08 NOTE — Patient Instructions (Signed)
      Blood work was ordered.   Have xrays downstairs.      Medications changes include :   None

## 2024-07-08 NOTE — Telephone Encounter (Signed)
 Pt daughter is coming with her today, believes it may be a infection

## 2024-07-08 NOTE — Progress Notes (Signed)
 Subjective:    Patient ID: Lisa Cox, female    DOB: [birth year], 78 y.o.   MRN: [id number]      HPI Lisa Cox is here for  Chief Complaint  Patient presents with   Fever    Low grade fever x 2wks, wbc count high, concerned for sepsis, tylenol /ibuprofen for pain and fever  9/13 [facility] ED in [place] concerned about recurrent or persistent UTI.  Had received Keflex , but then switched to Cipro  due to persistent symptoms.  Persistent low-grade fever x 2 weeks.  Having muscle aches.  WBC 12.1, GFR 42 COVID, flu, RSV negative.  Urinalysis looks like possible infection-culture negative.  US  done on bladder.  Urine culture from 06/23/2024 and 07/03/2024 both negative.     Discussed the use of AI scribe software for clinical note transcription with the patient, who gave verbal consent to proceed.  Lisa Cox daughter is here with Lisa Cox and helps provide most of the history.  History of Present Illness Lisa Cox is an 78 year old female who presents with persistent fever and joint pain.  She has been experiencing persistent fever and joint pain for approximately two and a half to three weeks. The fever began on August 31st, fluctuating between 11F and 100F, and has persisted since then, often worsening at night. Initially, she experienced dysuria and took Keflex  that she was given by Lisa Cox urologist to take if needed given Lisa Cox recurrent urinary tract infections. When symptoms did not improve, she was prescribed Cipro , which she completed except for one dose due to severe nausea.  That urine culture was negative.  On September 12th, she was seen again due to ongoing fever. Blood work revealed a high white blood cell count, low kidney function (30), and low sodium levels. A subsequent visit to the emergency room included more blood work, urinalysis, and a bladder ultrasound. Despite antibiotics, Lisa Cox urine cultures returned clear, yet the fever persisted.  She reports significant left hip pain,  which has worsened with the fever and affects Lisa Cox mobility. The pain is described as severe, making it difficult for Lisa Cox to walk. She also experiences generalized joint pain, particularly in Lisa Cox knees, ankles, and hips, which she associates with flu-like symptoms. The pain improves with ibuprofen but not with Tylenol . She denies any prior imaging or evaluation of the hip.  She has been experiencing chills, muscle aches, and fatigue, particularly in Lisa Cox thighs and upper legs, which worsen with fever. She reports queasiness and decreased appetite, with difficulty maintaining hydration. She was given two liters of fluids at the emergency room, which temporarily improved Lisa Cox kidney function. Lisa Cox diuretic was discontinued by Lisa Cox cardiologist due to dehydration concerns.  No headaches, jaw pain, nasal congestion, sore throat, ear pain, sinus pain, cough, wheezing, or significant shortness of breath. No abdominal pain, constipation, diarrhea, or urinary symptoms currently. She experiences fatigue and weakness, with no recent travel or dental work. Lisa Cox urine has not been darker than usual.     Medications and allergies reviewed with patient and updated if appropriate.  Current Outpatient Medications on File Prior to Visit  Medication Sig Dispense Refill   aspirin  81 MG tablet Take 81 mg by mouth every evening.     atenolol  (TENORMIN ) 50 MG tablet TAKE 1 TABLET BY MOUTH TWICE A DAY 180 tablet 2   atorvastatin  (LIPITOR) 10 MG tablet Take 1 tablet (10 mg total) by mouth daily. 90 tablet 1   carboxymethylcellulose (REFRESH PLUS) 0.5 % SOLN Place 1  drop into both eyes 3 (three) times daily as needed (dry eye).      Cholecalciferol (VITAMIN D3) 50 MCG (2000 UT) TABS Take 2,000 Units by mouth daily.      dimenhyDRINATE (DRAMAMINE) 50 MG tablet Take 50 mg by mouth at bedtime. Must take with Lorazepam      famotidine  (PEPCID ) 20 MG tablet Take 1 tablet (20 mg total) by mouth at bedtime as needed for heartburn or  indigestion. 90 tablet 3   LORazepam  (ATIVAN ) 1 MG tablet TAKE 1 TABLET BY MOUTH EVERYDAY AT BEDTIME 90 tablet 1   Multiple Vitamins-Minerals (PRESERVISION AREDS 2) CAPS Take 1 capsule by mouth 2 (two) times daily.     ramipril  (ALTACE ) 10 MG capsule TAKE 1 CAPSULE BY MOUTH TWICE A DAY (Patient taking differently: Take 10 mg by mouth 2 (two) times daily. Patient takes once a day) 180 capsule 0   sodium chloride  (OCEAN) 0.65 % SOLN nasal spray Place 1 spray into both nostrils as needed for congestion.     spironolactone  (ALDACTONE ) 25 MG tablet TAKE 1 TABLET (25 MG TOTAL) BY MOUTH DAILY. 90 tablet 3   No current facility-administered medications on file prior to visit.    Review of Systems  Constitutional:  Positive for appetite change (decreased), chills, fatigue and fever (99-100).  HENT:  Negative for congestion, ear pain, sinus pain and sore throat.        No jaw pain  Respiratory:  Positive for shortness of breath (a little if in pain). Negative for cough and wheezing.   Gastrointestinal:  Negative for abdominal pain, constipation and diarrhea.  Genitourinary:  Negative for dysuria, frequency, hematuria and urgency.       No cloudy urine  Musculoskeletal:  Positive for arthralgias and myalgias. Negative for joint swelling.  Skin:  Negative for pallor.  Neurological:  Negative for dizziness, light-headedness and headaches (just back of head).       Objective:   Vitals:   07/08/24 1417  BP: (!) 150/72  Pulse: 94  Temp: 99.2 F (37.3 C)  SpO2: 95%   BP Readings from Last 3 Encounters:  07/08/24 (!) 150/72  07/07/24 (!) 160/80  07/03/24 108/62   Wt Readings from Last 3 Encounters:  07/08/24 170 lb (77.1 kg)  07/07/24 171 lb (77.6 kg)  07/03/24 167 lb (75.8 kg)   Body mass index is 28.29 kg/m.    Physical Exam Constitutional:      General: She is not in acute distress.    Appearance: Normal appearance. She is ill-appearing (Appears mildly ill).  HENT:     Head:  Normocephalic and atraumatic.     Right Ear: Ear canal and external ear normal. There is impacted cerumen.     Left Ear: Ear canal and external ear normal. There is impacted cerumen (Excessive cerumen-TM not visualized).     Mouth/Throat:     Mouth: Mucous membranes are moist.     Pharynx: No posterior oropharyngeal erythema.  Eyes:     Conjunctiva/sclera: Conjunctivae normal.  Cardiovascular:     Rate and Rhythm: Normal rate and regular rhythm.     Heart sounds: Murmur (2/6 systolic) heard.  Pulmonary:     Effort: Pulmonary effort is normal. No respiratory distress.     Breath sounds: Normal breath sounds. No wheezing.  Abdominal:     General: There is no distension.     Palpations: Abdomen is soft. There is no mass.     Tenderness: There is no abdominal tenderness.  There is no guarding or rebound.  Musculoskeletal:     Cervical back: Neck supple. No tenderness.     Right lower leg: No edema.     Left lower leg: No edema.  Lymphadenopathy:     Cervical: No cervical adenopathy.  Skin:    General: Skin is warm and dry.     Findings: No rash.  Neurological:     Mental Status: She is alert.  Psychiatric:        Mood and Affect: Mood normal.        Behavior: Behavior normal.     Reviewed blood work from [facility] emergency room  DG HIP UNILAT W OR W/O PELVIS 2-3 VIEWS LEFT CLINICAL DATA:  pain, fever  EXAM: DG HIP (WITH OR WITHOUT PELVIS) 2-3V LEFT  COMPARISON:  11/25/2019  FINDINGS: Osteopenia.No evidence of pelvic fracture or diastasis.No acute hip fracture or dislocation.Mild right and moderate left joint space loss of the hips. L5-S1 degenerative disc disease. Soft tissues are unremarkable.  IMPRESSION: 1. Osteopenia. No acute fracture, pelvic bone diastasis, or dislocation. 2. Mild-to-moderate osteoarthritis of the hips.  Electronically Signed   By: Rogelia Myers M.D.   On: 07/08/2024 15:28 DG Chest 2 View CLINICAL DATA:  fever, r/o PNA  EXAM: CHEST - 2  VIEW  COMPARISON:  06/09/2019  FINDINGS: Trace bilateral pleural effusions. No focal airspace consolidation or pneumothorax. No cardiomegaly. Tortuous aorta with aortic atherosclerosis. No acute fracture or destructive lesions. Multilevel thoracic osteophytosis. Osteopenia.  IMPRESSION: Trace bilateral pleural effusions.  No pneumonia or pulmonary edema.  Electronically Signed   By: Rogelia Myers M.D.   On: 07/08/2024 15:24      Assessment & Plan:    Assessment and Plan Assessment & Plan Fever, fatigue, and myalgias of unclear etiology Persistent fever, fatigue, and myalgias for three weeks. Differential includes viral infection, pneumonia, or systemic infection.  Has high WBC count indicating possible infection - Order chest x-ray to evaluate for pneumonia or thoracic pathology. - Order blood work to assess for systemic infection or abnormalities.  CBC, CMP, lactic acid, ANA, ESR, CRP -Some urinary frequency which is likely within normal limits, but will check urinalysis, urine culture-at this point I do not think there is an infection - Continue alternating Tylenol  and ibuprofen, emphasize Tylenol  to reduce ibuprofen use. - Encourage increased fluid intake. - Consider hospital admission if symptoms persist without diagnosis after current workup.  Left hip pain and generalized joint pain associated with fever Pain in hip may be related to the fever and increased general achiness versus flare of osteoarthritis I do not think she has a septic joint-never had the joint replaced so would be very unlikely - Order hip/pelvic x-ray to evaluate for structural abnormalities or arthritis. - Continue ibuprofen for pain management, with caution due to kidney impact.  Acute kidney injury likely secondary to dehydration and NSAID use, hyponatremia Acute kidney injury likely due to dehydration and NSAID use. Low sodium levels likely related to poor p.o. intake. No UTI or kidney  infection. - Encourage increased oral fluid intake. - CBC, CMP  Lower extremity edema Chronic, stable     I personally spent a total of 35 minutes in the care of the patient today including preparing to see the patient, getting/reviewing separately obtained history, performing a medically appropriate exam/evaluation, counseling and educating, placing orders, and documenting clinical information in the EHR.

## 2024-07-08 NOTE — Telephone Encounter (Signed)
 Copied from CRM #[id number]. Topic: Clinical - Red Word Triage >> Jul 08, 2024  8:28 AM Mia F wrote: Red Word that prompted transfer to Nurse Triage: Had a low grade fever since last week. Complaining about muscles hurting all over but worse in her left hip. She is having a heard time walking. She has night sweats as if she is having a fever. She had an UTI and had a hard time with it clearing with first antibiotic. She was given another medication and it made her sick but she did finish it. She had blood work done and her WBC was 12.1 and kidney function was 30.27. Her daughter says the fever is low grade but has been going on for 2 weeks after giving medication Reason for Disposition  [1] Fever > 100 F (37.8 C) AND [2] bedridden (e.g., CVA, chronic illness, recovering from surgery)    Had UTI recently  treated for that.  Left hip pain is worse.   Maybe septic in her hip joint and it's in her blood stream per daughter, Vernell.  Answer Assessment - Initial Assessment Questions 1. TEMPERATURE: What is the most recent temperature?  How was it measured?      Vernell, daughter on DPR calling in. Has a low grade fever 2 weeks now.   Post UTI.   All of her muscles in the back of her head hurting and c/o of her left hip.  She can't hardly walk.  She started off 2 weeks ago.  She was on Keflex  from urologist.   This UTI happened.    She was then prescribed Cipro  by Dr. Geofm.    It made her sick but took it all.    She is still having the fever.     Last Friday Dr. Lynwood Rush did blood work and urine tests.   The blood work showed she had high WBC and leukocytes and kidney function was not good, 30.    Culture on Sunday was negative but she is still have low grade fever.    Went to [facility] ED and they did more tets.    U/S done of bladder.  They said it was a virus.   But the fever is still there.   I'm very concerned that this infection is in the hip joint and she is septic, maybe.   She's been in good  health until this.  She had heart check up yesterday.   He took her off her fluid pill because her potassium and sodium was low.   Poor appetite.   [facility] ED gave her 2 L of fluid and her kidney numbers got better.   The fluid pill has affected her too.    She has infection somewhere in her body.   I think it's her left hip.    2. ONSET: When did the fever start?      2 weeks now low grade fever 3. CHILLS: Do you have chills? If yes: How bad are they?  (e.g., none, mild, moderate, severe)     Muscle pain.   99 low grade  sometimes 100.2.    Mostly affects her at night and her legs are restless.   She wakes up sweating a lot.   They told me not to give Tylenol .   I gave her ibuprofen which helped.   The fever keeps coming back.   4. OTHER SYMPTOMS: Do you have any other symptoms besides the fever?  (e.g., abdomen pain, cough,  diarrhea, earache, headache, sore throat, urination pain)     See above 5. CAUSE: If there are no symptoms, ask: What do you think is causing the fever?      We don't know but maybe her left hip joint.   6. CONTACTS: Does anyone else in the family have an infection?     N/A 7. TREATMENT: What have you done so far to treat this fever? (e.g., OTC fever medicines)     See above 8. IMMUNOCOMPROMISE: Do you have any of the following: diabetes, HIV positive, splenectomy, cancer chemotherapy, chronic steroid treatment, transplant patient, etc.?     Her age 78. PREGNANCY: Is there any chance you are pregnant? When was your last menstrual period?     N/A 10. TRAVEL: Have you traveled out of the country in the last month? (e.g., travel history, exposures)       N/A  Protocols used: Fever-A-AH Pt seen in ED 07/04/2024.   Needs hospital follow up visit.  Daughter, Vernell, on DPR asking if mother can be seen today 9/17 with Dr. Rollene.   She is concerned her mother may be septic.   Has low grade fever for 2 weeks now and she is still c/o muscle aches left hip  joint pain and not feeling well.   See triage notes. Is it possible she can be seen today?   Vernell can be reached at [phone].         FYI Only or Action Required?: Action required by provider: request for appointment.  Patient was last seen in primary care on 07/03/2024 by Norleen Lynwood ORN, MD. Requesting to be seen by Dr. Rollene today if possible.  Called Nurse Triage reporting Fever. 99.2-100 for the last 2 weeks.  C/o muscle aches, left hip pain, difficulty walking, daughter concerned her mother may be septic.  Seen in ED 07/04/2024.   Symptoms began several weeks ago. Low grade fever and not feeling well for the last 2 weeks.   Was recently treated for UTI and it has resolved.  Left hip pain is worse and concerning for infection per Vernell.     Interventions attempted: Prescription medications: antibiotics.See triage notes for complete details.  Symptoms are: gradually worsening.Fever continues and left hip pain worse  Triage Disposition: See HCP Within 4 Hours (Or PCP Triage) High priority message sent with request to be seen today, if possible by Dr Rollene.     Patient/caregiver understands and will follow disposition?: Yes

## 2024-07-09 ENCOUNTER — Telehealth: Payer: Self-pay | Admitting: Radiology

## 2024-07-09 ENCOUNTER — Telehealth: Payer: Self-pay | Admitting: Internal Medicine

## 2024-07-09 ENCOUNTER — Encounter: Payer: Self-pay | Admitting: Cardiology

## 2024-07-09 NOTE — Assessment & Plan Note (Signed)
 Creatinine seems to be stable but she is somewhat hyponatremic.  Defer to PCP.

## 2024-07-09 NOTE — Telephone Encounter (Signed)
 Copied from CRM [id number]. Topic: Clinical - Lab/Test Results >> Jul 09, 2024  1:13 PM Pinkey ORN wrote: Reason for CRM: Lab Results / Imagining Results >> Jul 09, 2024  1:14 PM Pinkey ORN wrote: Lisa Cox daughter is requesting a call back to further discuss the lab results and imagining results. Please follow up at [phone]

## 2024-07-09 NOTE — Assessment & Plan Note (Signed)
 Pretty much asymptomatic now after ablation.  Remains on atenolol  50 mg twice daily with adequate heart rate control.

## 2024-07-09 NOTE — Assessment & Plan Note (Signed)
 Lipids are well-controlled with an LDL of 51 on recent labs.  She is on 10 mg atorvastatin . Continue atorvastatin 

## 2024-07-09 NOTE — Telephone Encounter (Signed)
 Please call her daughter.  Her x-rays show that she does not have pneumonia.  The hip x-ray shows mild-moderate arthritis of both hips.  Some of her blood work is still pending.  Her sodium is still slightly low, but stable.  Her kidney function has improved and only slightly decreased.  Her white blood cell count is still elevated at 13.3.  She has very mild anemia and her platelet count is slightly elevated.  Her urinalysis looks similar to her previous ones.  Almost looks like an infection, but her culture is still pending.  Her symptoms and blood work to suggest an infection.  Her culture should be back tomorrow.  The cultures in the past have been negative though I still think a urine infection is less likely.  If she continues to feel how she is feeling I think it is unreasonable to take her to the emergency room for more thorough evaluation.  The emergency room should be able to see everything in her chart.

## 2024-07-09 NOTE — Telephone Encounter (Signed)
 Spoke with daughter and patient today and results relayed.

## 2024-07-09 NOTE — Assessment & Plan Note (Signed)
 Blood pressure is high today, but has been better controlled on similar medications just recently.  I think this is probably more related to her ongoing illness.  Would not adjust medicines at this time.  For now we will just have to continue the medicines that had her controlled for a while including 50 mg twice daily atenolol  and ramipril  which she is now taking 10 mg just once daily along with spironolactone  25 mg daily.

## 2024-07-09 NOTE — Assessment & Plan Note (Signed)
 I think her symptoms are probably related to some type of viral illness.  Sounds like she is having low-grade fevers with myalgias.  I recommend that she sees her PCP to discuss these symptoms.  I do not think it sounds like UTI.

## 2024-07-09 NOTE — Assessment & Plan Note (Signed)
 Blood pressures have been pretty much at goal but today a little high.  For now we will continue beta-blocker, ARB and spironolactone  at current doses. Seems relatively euvolemic if anything elevated dehydrated.

## 2024-07-09 NOTE — Telephone Encounter (Signed)
 Spoke with daughter this afternoon earlier.

## 2024-07-10 ENCOUNTER — Ambulatory Visit: Payer: Self-pay | Admitting: Internal Medicine

## 2024-07-10 LAB — URINE CULTURE: Result:: NO GROWTH

## 2024-07-10 LAB — ANA: Anti Nuclear Antibody (ANA): NEGATIVE

## 2024-07-10 LAB — LACTIC ACID, PLASMA: LACTIC ACID: 1 mmol/L (ref 0.4–1.8)

## 2024-07-13 NOTE — Telephone Encounter (Signed)
 Copied from CRM #[id number]. Topic: Clinical - Request for Lab/Test Order >> Jul 10, 2024  5:14 PM Drema MATSU wrote: Reason for CRM: Patient has been very sick for weeks and is wanting patient to have a CT scan of her body on next week to find out where the infection is coming from. Patient has already been to the ER the week before last with no result.

## 2024-07-13 NOTE — Progress Notes (Signed)
 Please advise if CT scan should be done since patient has already been to the ER

## 2024-07-14 DIAGNOSIS — R5383 Other fatigue: Secondary | ICD-10-CM | POA: Diagnosis not present

## 2024-07-14 DIAGNOSIS — K449 Diaphragmatic hernia without obstruction or gangrene: Secondary | ICD-10-CM | POA: Diagnosis not present

## 2024-07-14 DIAGNOSIS — R140 Abdominal distension (gaseous): Secondary | ICD-10-CM | POA: Diagnosis not present

## 2024-07-14 DIAGNOSIS — R509 Fever, unspecified: Secondary | ICD-10-CM | POA: Diagnosis not present

## 2024-07-14 DIAGNOSIS — R918 Other nonspecific abnormal finding of lung field: Secondary | ICD-10-CM | POA: Diagnosis not present

## 2024-07-14 DIAGNOSIS — Z853 Personal history of malignant neoplasm of breast: Secondary | ICD-10-CM | POA: Diagnosis not present

## 2024-07-14 DIAGNOSIS — R5381 Other malaise: Secondary | ICD-10-CM | POA: Diagnosis not present

## 2024-07-14 DIAGNOSIS — H9202 Otalgia, left ear: Secondary | ICD-10-CM | POA: Diagnosis not present

## 2024-07-14 DIAGNOSIS — I10 Essential (primary) hypertension: Secondary | ICD-10-CM | POA: Diagnosis not present

## 2024-07-17 ENCOUNTER — Encounter: Payer: Self-pay | Admitting: Internal Medicine

## 2024-07-17 ENCOUNTER — Ambulatory Visit (INDEPENDENT_AMBULATORY_CARE_PROVIDER_SITE_OTHER): Admitting: Internal Medicine

## 2024-07-17 VITALS — BP 128/80 | HR 83 | Temp 98.3°F | Ht 65.0 in | Wt 168.0 lb

## 2024-07-17 DIAGNOSIS — I10 Essential (primary) hypertension: Secondary | ICD-10-CM

## 2024-07-17 DIAGNOSIS — M791 Myalgia, unspecified site: Secondary | ICD-10-CM

## 2024-07-17 DIAGNOSIS — R1319 Other dysphagia: Secondary | ICD-10-CM | POA: Diagnosis not present

## 2024-07-17 DIAGNOSIS — M792 Neuralgia and neuritis, unspecified: Secondary | ICD-10-CM

## 2024-07-17 DIAGNOSIS — N1831 Chronic kidney disease, stage 3a: Secondary | ICD-10-CM | POA: Diagnosis not present

## 2024-07-17 DIAGNOSIS — R935 Abnormal findings on diagnostic imaging of other abdominal regions, including retroperitoneum: Secondary | ICD-10-CM | POA: Diagnosis not present

## 2024-07-17 DIAGNOSIS — R1310 Dysphagia, unspecified: Secondary | ICD-10-CM | POA: Insufficient documentation

## 2024-07-17 LAB — COMPREHENSIVE METABOLIC PANEL WITH GFR
ALT: 19 U/L (ref 0–35)
AST: 19 U/L (ref 0–37)
Albumin: 3.2 g/dL — ABNORMAL LOW (ref 3.5–5.2)
Alkaline Phosphatase: 69 U/L (ref 39–117)
BUN: 16 mg/dL (ref 6–23)
CO2: 33 meq/L — ABNORMAL HIGH (ref 19–32)
Calcium: 9.1 mg/dL (ref 8.4–10.5)
Chloride: 96 meq/L (ref 96–112)
Creatinine, Ser: 1.12 mg/dL (ref 0.40–1.20)
GFR: 46.42 mL/min — ABNORMAL LOW (ref >60.00)
Glucose, Bld: 90 mg/dL (ref 70–99)
Potassium: 4.4 meq/L (ref 3.5–5.1)
Sodium: 133 meq/L — ABNORMAL LOW (ref 135–145)
Total Bilirubin: 0.3 mg/dL (ref 0.2–1.2)
Total Protein: 6.4 g/dL (ref 6.0–8.3)

## 2024-07-17 MED ORDER — MELOXICAM 7.5 MG PO TABS: 7.5000 mg | ORAL_TABLET | Freq: Every day | ORAL | 0 refills | Status: AC

## 2024-07-17 NOTE — Assessment & Plan Note (Signed)
 Edema in feet and calves is likely due to venous insufficiency, worsened by stopping spironolactone . Sodium level is 130 mEq/L. There are no signs of heart failure, and the previous pleural effusion has resolved. Check sodium and kidney function today. Consider restarting spironolactone  if labs are stable. Recheck blood counts and sodium in two weeks if spironolactone  is restarted. Interestingly cardiology does not reflect the advice patient and her daughter remember from that visit. Consider compression stockings if fluid retention persists and spironolactone  is not restarted.

## 2024-07-17 NOTE — Progress Notes (Signed)
 Subjective:   Patient ID: Lisa Cox, female    DOB: 05/13/1946, 78 y.o.   MRN: [id number]  Discussed the use of AI scribe software for clinical note transcription with the patient, who gave verbal consent to proceed.  History of Present Illness Lisa Cox is an 78 year old female who presents with persistent nerve pain and swelling in her legs.  She has a history of urinary tract infection for which she was prescribed Ciprofloxacin . She completed the course except for the last pill due to nausea. Following this, she experienced chills, night sweats, and myalgia, particularly in her legs, which made it difficult to sleep. These symptoms persisted for a couple of weeks.  Her urine was found to be normal, but she was taken to the ER, where she was told her sodium was low and she was dehydrated. Her blood count was elevated, and she was informed that the condition might be viral. Despite this, her symptoms did not improve over the following week.  She was taken off spironolactone  due to low sodium levels. Her left foot used to swell prior to spironolactone , but now both feet and calves are swollen.  She describes experiencing shooting nerve pains in her ear and the back of her head, which have shifted from the left to the right side. This has resulted in neck stiffness, limiting her ability to turn her head fully, impacting her ability to drive safely. An ear doctor cleaned wax from her ear and prescribed Debrox, but the nerve pain persists.  She has had episodes of queasiness and difficulty swallowing, with instances of water regurgitation. She reports a sensation of bloating and an increase in stomach size. She has a history of a hiatal hernia since her thirties.  She experienced severe joint pain during the period of muscle aches, which made walking difficult, requiring a wheelchair at times. This joint pain has since resolved, but she continues to experience intermittent symptoms in  different parts of her body.  She recalls a recent episode of fever, chills, and sweating earlier this week, which led to another ER visit where a scan was performed. She has not traveled recently and is unaware of any sick contacts.  She is not currently taking spironolactone , and she has been advised by her cardiologist to stop due to low sodium levels  Review of Systems  Constitutional:  Positive for activity change and fatigue.  HENT: Negative.    Eyes: Negative.   Respiratory:  Negative for cough, chest tightness and shortness of breath.   Cardiovascular:  Negative for chest pain, palpitations and leg swelling.  Gastrointestinal:  Negative for abdominal distention, abdominal pain, constipation, diarrhea, nausea and vomiting.  Musculoskeletal:  Positive for myalgias.  Skin: Negative.   Neurological: Negative.   Psychiatric/Behavioral: Negative.      Objective:  Physical Exam Constitutional:      Appearance: She is well-developed.  HENT:     Head: Normocephalic and atraumatic.  Cardiovascular:     Rate and Rhythm: Normal rate and regular rhythm.  Pulmonary:     Effort: Pulmonary effort is normal. No respiratory distress.     Breath sounds: Normal breath sounds. No wheezing or rales.  Abdominal:     General: Bowel sounds are normal. There is no distension.     Palpations: Abdomen is soft.     Tenderness: There is no abdominal tenderness.  Musculoskeletal:     Cervical back: Normal range of motion.  Skin:    General:  Skin is warm and dry.  Neurological:     Mental Status: She is alert and oriented to person, place, and time.     Coordination: Coordination normal.     Vitals:   07/17/24 1118  BP: 128/80  Pulse: 83  Temp: 98.3 F (36.8 C)  TempSrc: Oral  SpO2: 98%  Weight: 168 lb (76.2 kg)  Height: 5' 5 (1.651 m)    Assessment and Plan Assessment & Plan Peripheral edema with hyponatremia following discontinuation of spironolactone    Edema in feet and calves  is likely due to venous insufficiency, worsened by stopping spironolactone . Sodium level is 130 mEq/L. There are no signs of heart failure, and the previous pleural effusion has resolved. Check sodium and kidney function today. Consider restarting spironolactone  if labs are stable. Recheck blood counts and sodium in two weeks if spironolactone  is restarted. Interestingly cardiology does not reflect the advice patient and her daughter remember from that visit. Consider compression stockings if fluid retention persists and spironolactone  is not restarted.  Myalgia and arthralgia with intermittent fever and chills   Intermittent myalgia and arthralgia with fever and chills are likely post-viral. Symptoms have improved but are not resolved.  Neuralgia involving head, neck, and ear   Neuralgia with shooting pains in the ear and back of the head may be related to TMJ or a viral cause. No jaw pain is reported. Prescribe meloxicam  for pain management.  Dysphagia and bloating in setting of hiatal hernia   Dysphagia and bloating are likely related to a known hiatal hernia. Recent imaging shows no new findings. Refer to a gastroenterologist for possible endoscopy and esophageal dilation.

## 2024-07-17 NOTE — Assessment & Plan Note (Signed)
 Dysphagia and bloating are likely related to a known hiatal hernia. Recent imaging shows no new findings. Refer to a gastroenterologist for possible endoscopy and esophageal dilation.

## 2024-07-17 NOTE — Assessment & Plan Note (Signed)
 Neuralgia with shooting pains in the ear and back of the head may be related to TMJ or a viral cause. No jaw pain is reported. Prescribe meloxicam  for pain management.

## 2024-07-17 NOTE — Assessment & Plan Note (Signed)
 Intermittent myalgia and arthralgia with fever and chills are likely post-viral. Symptoms have improved but are not resolved.

## 2024-07-17 NOTE — Assessment & Plan Note (Signed)
 Checking CMP today and then again in 2-3 weeks if we restart a diuretic.

## 2024-07-17 NOTE — Patient Instructions (Signed)
We will check the labs today and have sent in meloxicam.

## 2024-07-17 NOTE — Assessment & Plan Note (Signed)
 Showed some distal colonic thickening versus underdistention. She had used laxatives recently before scan so is more likely related to underdistention. She is not having any abdominal pain, change in bowel habits, iron deficiency recently. Last colonscopy 2012 with 1 polyp and elected not to repeat in 2022 due to age.

## 2024-07-20 ENCOUNTER — Ambulatory Visit: Payer: Self-pay | Admitting: Internal Medicine

## 2024-07-23 ENCOUNTER — Ambulatory Visit: Payer: Self-pay

## 2024-07-23 NOTE — Telephone Encounter (Signed)
 Fyi/ please advise

## 2024-07-23 NOTE — Telephone Encounter (Signed)
 Called patient and informed of of this information

## 2024-07-23 NOTE — Telephone Encounter (Signed)
 Patient declined to be seen sooner and states that she will wait until her appointment on the 17 th.

## 2024-07-23 NOTE — Telephone Encounter (Signed)
 FYI Only or Action Required?: FYI only for provider.  Patient was last seen in primary care on 07/17/2024 by Rollene Almarie LABOR, MD.  Called Nurse Triage reporting Chills.  Symptoms began about a month ago.  Interventions attempted: OTC medications: Ibuprofen .  Symptoms are: unchanged.  Triage Disposition: See Physician Within 24 Hours Pt only wants to see PCP. Advised pt next appt is 08/07/24. Pt offered appt with another provider to be seen sooner and she refused. Appt wait listed.  Patient/caregiver understands and will follow disposition?: Yes      Reason for Disposition  Fever present > 3 days (72 hours)  Answer Assessment - Initial Assessment Questions 1. TEMPERATURE: What is the most recent temperature?  How was it measured?      100.1 2. ONSET: When did the fever start?      1 month  3. CHILLS: Do you have chills? If yes: How bad are they?  (e.g., none, mild, moderate, severe)     yes 4. OTHER SYMPTOMS: Do you have any other symptoms besides the fever?  (e.g., abdomen pain, cough, diarrhea, earache, headache, sore throat, urination pain)     Night sweats/weakness  7. TREATMENT: What have you done so far to treat this fever? (e.g., OTC fever medicines)     Ibuprofen   8. IMMUNOCOMPROMISE: Do you have any of the following: diabetes, HIV positive, splenectomy, cancer chemotherapy, chronic steroid treatment, transplant patient, etc.?     no  Protocols used: Fever-A-AH

## 2024-07-23 NOTE — Telephone Encounter (Signed)
 Summary: chills,night sweats   Reason for Triage: ongoing fever ,chills,night sweats         First attempt; no answer

## 2024-07-24 ENCOUNTER — Ambulatory Visit: Payer: Self-pay

## 2024-07-24 NOTE — Telephone Encounter (Signed)
 FYI Only or Action Required?: Action required by provider: referral request.  Patient was last seen in primary care on 07/17/2024 by Rollene Almarie LABOR, MD.  Called Nurse Triage reporting Fever.  Symptoms began several weeks ago.  Interventions attempted: Other: Been to ED and UC multiple times.  Symptoms are: gradually worsening.  Triage Disposition: Home Care  Patient/caregiver understands and will follow disposition?: No, wishes to speak with PCP Reason for Disposition  [1] Fever AND [2] no signs of serious infection or localizing symptoms (all other triage questions negative)  Answer Assessment - Initial Assessment Questions Patients daughter calling on behalf of patient, stated her brother called and said temp this afternoon was 99, feeling really fatigued. Family is at wits end, wondering what best next steps as far as evaluation and treatment. They dont know why this keeps happening. Does she need to go to infectious disease or a hematologist please advise. [phone]  1. TEMPERATURE: What is the most recent temperature?  How was it measured?      99  2. ONSET: When did the fever start?      6 weeks ago  3. CHILLS: Do you have chills? If yes: How bad are they?  (e.g., none, mild, moderate, severe)     Yes  4. OTHER SYMPTOMS: Do you have any other symptoms besides the fever?  (e.g., abdomen pain, cough, diarrhea, earache, headache, sore throat, urination pain)     Stiff neck  5. CAUSE: If there are no symptoms, ask: What do you think is causing the fever?      Unsure, daughter stated patient thought she had ovarian cancer and now thinks she has lymphoma  Protocols used: [facility]  Copied from CRM [id number]. Topic: Clinical - Red Word Triage >> Jul 24, 2024  4:22 PM Harlene ORN wrote: Red Word that prompted transfer to Nurse Triage: running a fever off and on for over a month Wants to know if she needs to be referred to either an Infectious Disease or a  Hematologist.

## 2024-07-27 NOTE — Telephone Encounter (Signed)
Please advise as MD is out of office

## 2024-07-27 NOTE — Telephone Encounter (Signed)
 It looks like this was addressed in a previous encounter by Lauraine who said she needed to be seen or go to the ER.

## 2024-07-28 ENCOUNTER — Ambulatory Visit: Admitting: Internal Medicine

## 2024-07-31 ENCOUNTER — Telehealth: Payer: Self-pay | Admitting: Cardiology

## 2024-07-31 NOTE — Telephone Encounter (Signed)
 Pt calling to ask if she can start her spironolactone  again. She was advised to discontinue at 9/16 appt, but has since has swelling in feet and ankles, and increased blood pressure. Please advise.  Pt c/o medication issue:  1. Name of Medication:   spironolactone  (ALDACTONE ) 25 MG tablet    2. How are you currently taking this medication (dosage and times per day)? None   3. Are you having a reaction (difficulty breathing--STAT)? No  4. What is your medication issue? Pt is having swelling and mild SOB since discontinuing medication   Pt c/o swelling/edema: STAT if pt has developed SOB within 24 hours  If swelling, where is the swelling located? Legs, feet   How much weight have you gained and in what time span? <5  Have you gained 2 pounds in a day or 5 pounds in a week? Yes   Do you have a log of your daily weights (if so, list)? No  Are you currently taking a fluid pill? No   Are you currently SOB? Mild   Have you traveled recently in a car or plane for an extended period of time? No

## 2024-07-31 NOTE — Telephone Encounter (Signed)
 9/16- OV with Dr Anner Medication Instructions:     Stop taking spironolactone  until you see your primary  Dr Verdis.     Lisa Cox LABOR, MD to [name]     07/20/24  7:49 AM Sodium is improved. I would recommend we leave you off spironolactone  and use compression stockings for the fluid as well as drinking plenty of water.     S/w the patient and she reports that her ankles and legs are swollen. She has not been weighing herself regularly. Instructed her to keep a log of her weight daily, every morning after urinating.   She is 162.2- weighed herself while on the phone- this is with clothes on. Last weight recorder with [facility] is 168.   She reports that sometimes at night she will feel a little short of breath. She feels better when she props the pillows up.  She is not short of breath during the day. She is not short of breath doing normal activities, but does notice some shortness of breath with exertion- extended periods of walking.   Asked her to keep a log of her Weight and also check her BP's daily- 2 hours after taking morning meds. Instructed her to elevate legs several times during the day. Use compression socks during the day, not at night. Will forward information to her provider, then contact her with recommendations. Given ER precautions. She verbalized understanding

## 2024-07-31 NOTE — Telephone Encounter (Signed)
 I would agree with her PCP.SABRA Sodium level dropping is a somewhat dangerous thing and would prefer to avoid diuretic at least for the near future.  I think support stockings and foot elevation would be the best option.  Alm Clay, MD

## 2024-08-06 DIAGNOSIS — R1311 Dysphagia, oral phase: Secondary | ICD-10-CM | POA: Diagnosis not present

## 2024-08-07 ENCOUNTER — Ambulatory Visit: Admitting: Internal Medicine

## 2024-08-09 ENCOUNTER — Other Ambulatory Visit: Payer: Self-pay | Admitting: Cardiology

## 2024-08-12 ENCOUNTER — Other Ambulatory Visit: Payer: Self-pay | Admitting: Internal Medicine

## 2024-08-12 DIAGNOSIS — Z6828 Body mass index (BMI) 28.0-28.9, adult: Secondary | ICD-10-CM | POA: Diagnosis not present

## 2024-08-12 DIAGNOSIS — Z1231 Encounter for screening mammogram for malignant neoplasm of breast: Secondary | ICD-10-CM

## 2024-08-12 DIAGNOSIS — Z01419 Encounter for gynecological examination (general) (routine) without abnormal findings: Secondary | ICD-10-CM | POA: Diagnosis not present

## 2024-09-03 ENCOUNTER — Ambulatory Visit
Admission: RE | Admit: 2024-09-03 | Discharge: 2024-09-03 | Disposition: A | Discharge status: Home / Self Care | Source: Ambulatory Visit | Attending: Internal Medicine | Admitting: Internal Medicine

## 2024-09-03 DIAGNOSIS — Z1231 Encounter for screening mammogram for malignant neoplasm of breast: Secondary | ICD-10-CM

## 2024-09-07 ENCOUNTER — Ambulatory Visit: Payer: Self-pay | Admitting: Internal Medicine

## 2024-09-09 ENCOUNTER — Telehealth: Payer: Self-pay

## 2024-09-09 NOTE — Telephone Encounter (Signed)
 Copied from CRM #[id number]. Topic: Clinical - Prescription Issue >> Sep 09, 2024  9:17 AM Eva FALCON wrote: Reason for CRM: Pt states her son got into her Lorazepam  pills, says her next fill is DEC 13, she was wondering if Dr. Rollene could request 15 pills to last until then or release the fill for an earlier date? If so she would like it for [facility] on [address].

## 2024-09-09 NOTE — Telephone Encounter (Signed)
 Copied from CRM #[id number]. Topic: Clinical - Prescription Issue >> Sep 09, 2024  9:17 AM Eva FALCON wrote: Reason for CRM: Pt states her son got into her Lorazepam  pills, says her next fill is DEC 13, she was wondering if Dr. Rollene could request 15 pills to last until then or release the fill for an earlier date? If so she would like [facility] for [facility] [address]. >> Sep 09, 2024  4:57 PM Tysheama G wrote: Patient is calling again regarding medication Lorazepam  and if Dr.Crawford can fill some more because someone got into her pills. And patient is all out of her medication

## 2024-09-10 NOTE — Telephone Encounter (Unsigned)
 Copied from CRM #[id number]. Topic: Clinical - Prescription Issue >> Sep 09, 2024  9:17 AM Eva FALCON wrote: Reason for CRM: Pt states her son got into her Lorazepam  pills, says her next fill is DEC 13, she was wondering if Dr. Rollene could request 15 pills to last until then or release the fill for an earlier date? If so she would like [facility] for [facility] [address]. >> Sep 10, 2024  8:12 AM Robinson H wrote: Patient states she doesn't need the Lorazepam  pills states she has found some that hold her until she gets her next refill. >> Sep 09, 2024  4:57 PM Tysheama G wrote: Patient is calling again regarding medication Lorazepam  and if Dr.Crawford can fill some more because someone got into her pills. And patient is all out of her medication

## 2024-09-11 NOTE — Telephone Encounter (Signed)
 Ok to call pharmacy and ok an early fill. She does still have refill.

## 2024-09-15 ENCOUNTER — Ambulatory Visit: Admitting: Internal Medicine

## 2024-09-16 NOTE — Telephone Encounter (Signed)
 Called and talked to pharmacist she should be able to fill but this is a one time thing. Typically we treat medications like cash and if they are lost or stolen we are unable to fill early to replace. I recommend she lock her medication up going forward.

## 2024-09-16 NOTE — Telephone Encounter (Signed)
 Pt is aware. Advised medication use/storage per Dr. Rollene. Pt verbalized understanding.

## 2024-09-17 ENCOUNTER — Other Ambulatory Visit: Payer: Self-pay | Admitting: Cardiology

## 2024-09-22 NOTE — Telephone Encounter (Signed)
 Spironolactone  was discontinued by PCP.   See office visit 07/17/2024 with Dr. Rollene.
